# Patient Record
Sex: Female | Born: 1955 | Race: Black or African American | Hispanic: No | Marital: Married | Smoking: Never smoker
Health system: Southern US, Community
[De-identification: ages and names within clinical notes are randomized; demographics above are authoritative.]

## PROBLEM LIST (undated history)

## (undated) DIAGNOSIS — T7840XA Allergy, unspecified, initial encounter: Secondary | ICD-10-CM

## (undated) DIAGNOSIS — E119 Type 2 diabetes mellitus without complications: Secondary | ICD-10-CM

## (undated) DIAGNOSIS — D649 Anemia, unspecified: Secondary | ICD-10-CM

## (undated) DIAGNOSIS — I8390 Asymptomatic varicose veins of unspecified lower extremity: Secondary | ICD-10-CM

## (undated) DIAGNOSIS — D219 Benign neoplasm of connective and other soft tissue, unspecified: Secondary | ICD-10-CM

## (undated) DIAGNOSIS — I10 Essential (primary) hypertension: Secondary | ICD-10-CM

## (undated) DIAGNOSIS — K219 Gastro-esophageal reflux disease without esophagitis: Secondary | ICD-10-CM

## (undated) DIAGNOSIS — Z8639 Personal history of other endocrine, nutritional and metabolic disease: Secondary | ICD-10-CM

## (undated) HISTORY — PX: BREAST SURGERY: SHX581

## (undated) HISTORY — DX: Asymptomatic varicose veins of unspecified lower extremity: I83.90

## (undated) HISTORY — DX: Personal history of other endocrine, nutritional and metabolic disease: Z86.39

## (undated) HISTORY — DX: Type 2 diabetes mellitus without complications: E11.9

## (undated) HISTORY — PX: EYE SURGERY: SHX253

## (undated) HISTORY — DX: Benign neoplasm of connective and other soft tissue, unspecified: D21.9

## (undated) HISTORY — DX: Allergy, unspecified, initial encounter: T78.40XA

## (undated) HISTORY — DX: Anemia, unspecified: D64.9

## (undated) HISTORY — DX: Gastro-esophageal reflux disease without esophagitis: K21.9

## (undated) HISTORY — DX: Essential (primary) hypertension: I10

---

## 1987-05-08 HISTORY — PX: DILATION AND CURETTAGE OF UTERUS: SHX78

## 1994-05-07 HISTORY — PX: TUBAL LIGATION: SHX77

## 1997-06-08 ENCOUNTER — Ambulatory Visit (HOSPITAL_COMMUNITY): Admission: RE | Admit: 1997-06-08 | Discharge: 1997-06-08 | Payer: Self-pay | Admitting: Obstetrics & Gynecology

## 1998-05-30 ENCOUNTER — Other Ambulatory Visit: Admission: RE | Admit: 1998-05-30 | Discharge: 1998-05-30 | Payer: Self-pay | Admitting: Obstetrics and Gynecology

## 1998-06-28 ENCOUNTER — Ambulatory Visit (HOSPITAL_COMMUNITY): Admission: RE | Admit: 1998-06-28 | Discharge: 1998-06-28 | Payer: Self-pay | Admitting: Obstetrics and Gynecology

## 1998-06-28 ENCOUNTER — Encounter: Payer: Self-pay | Admitting: Obstetrics and Gynecology

## 1999-07-04 ENCOUNTER — Encounter: Payer: Self-pay | Admitting: Obstetrics and Gynecology

## 1999-07-04 ENCOUNTER — Ambulatory Visit (HOSPITAL_COMMUNITY): Admission: RE | Admit: 1999-07-04 | Discharge: 1999-07-04 | Payer: Self-pay | Admitting: Obstetrics and Gynecology

## 1999-07-11 ENCOUNTER — Encounter (INDEPENDENT_AMBULATORY_CARE_PROVIDER_SITE_OTHER): Payer: Self-pay

## 1999-07-11 ENCOUNTER — Other Ambulatory Visit: Admission: RE | Admit: 1999-07-11 | Discharge: 1999-07-11 | Payer: Self-pay | Admitting: Obstetrics and Gynecology

## 2000-07-08 ENCOUNTER — Encounter: Payer: Self-pay | Admitting: Obstetrics and Gynecology

## 2000-07-08 ENCOUNTER — Ambulatory Visit (HOSPITAL_COMMUNITY): Admission: RE | Admit: 2000-07-08 | Discharge: 2000-07-08 | Payer: Self-pay | Admitting: Obstetrics and Gynecology

## 2001-07-18 ENCOUNTER — Encounter: Payer: Self-pay | Admitting: Obstetrics and Gynecology

## 2001-07-18 ENCOUNTER — Ambulatory Visit (HOSPITAL_COMMUNITY): Admission: RE | Admit: 2001-07-18 | Discharge: 2001-07-18 | Payer: Self-pay | Admitting: Obstetrics and Gynecology

## 2001-09-02 ENCOUNTER — Other Ambulatory Visit: Admission: RE | Admit: 2001-09-02 | Discharge: 2001-09-02 | Payer: Self-pay | Admitting: Obstetrics and Gynecology

## 2002-07-21 ENCOUNTER — Encounter: Payer: Self-pay | Admitting: Obstetrics and Gynecology

## 2002-07-21 ENCOUNTER — Ambulatory Visit (HOSPITAL_COMMUNITY): Admission: RE | Admit: 2002-07-21 | Discharge: 2002-07-21 | Payer: Self-pay | Admitting: Obstetrics and Gynecology

## 2002-09-08 ENCOUNTER — Other Ambulatory Visit: Admission: RE | Admit: 2002-09-08 | Discharge: 2002-09-08 | Payer: Self-pay | Admitting: Obstetrics and Gynecology

## 2003-05-25 ENCOUNTER — Encounter: Admission: RE | Admit: 2003-05-25 | Discharge: 2003-08-23 | Payer: Self-pay | Admitting: Family Medicine

## 2003-07-27 ENCOUNTER — Ambulatory Visit (HOSPITAL_COMMUNITY): Admission: RE | Admit: 2003-07-27 | Discharge: 2003-07-27 | Payer: Self-pay | Admitting: Obstetrics and Gynecology

## 2003-11-02 ENCOUNTER — Other Ambulatory Visit: Admission: RE | Admit: 2003-11-02 | Discharge: 2003-11-02 | Payer: Self-pay | Admitting: Obstetrics and Gynecology

## 2004-07-27 ENCOUNTER — Ambulatory Visit (HOSPITAL_COMMUNITY): Admission: RE | Admit: 2004-07-27 | Discharge: 2004-07-27 | Payer: Self-pay | Admitting: Obstetrics and Gynecology

## 2005-07-31 ENCOUNTER — Ambulatory Visit (HOSPITAL_COMMUNITY): Admission: RE | Admit: 2005-07-31 | Discharge: 2005-07-31 | Payer: Self-pay | Admitting: Obstetrics and Gynecology

## 2006-08-05 ENCOUNTER — Ambulatory Visit (HOSPITAL_COMMUNITY): Admission: RE | Admit: 2006-08-05 | Discharge: 2006-08-05 | Payer: Self-pay | Admitting: Obstetrics and Gynecology

## 2007-08-11 ENCOUNTER — Ambulatory Visit (HOSPITAL_COMMUNITY): Admission: RE | Admit: 2007-08-11 | Discharge: 2007-08-11 | Payer: Self-pay | Admitting: Obstetrics and Gynecology

## 2008-05-07 HISTORY — PX: BARTHOLIN GLAND CYST EXCISION: SHX565

## 2008-08-23 ENCOUNTER — Ambulatory Visit (HOSPITAL_COMMUNITY): Admission: RE | Admit: 2008-08-23 | Discharge: 2008-08-23 | Payer: Self-pay | Admitting: Obstetrics and Gynecology

## 2008-09-04 ENCOUNTER — Encounter (INDEPENDENT_AMBULATORY_CARE_PROVIDER_SITE_OTHER): Payer: Self-pay | Admitting: Obstetrics and Gynecology

## 2008-09-04 ENCOUNTER — Ambulatory Visit (HOSPITAL_COMMUNITY): Admission: RE | Admit: 2008-09-04 | Discharge: 2008-09-04 | Payer: Self-pay | Admitting: Obstetrics and Gynecology

## 2009-08-29 ENCOUNTER — Ambulatory Visit (HOSPITAL_COMMUNITY): Admission: RE | Admit: 2009-08-29 | Discharge: 2009-08-29 | Payer: Self-pay | Admitting: Obstetrics and Gynecology

## 2010-07-24 ENCOUNTER — Other Ambulatory Visit (HOSPITAL_COMMUNITY): Payer: Self-pay | Admitting: Obstetrics and Gynecology

## 2010-07-24 DIAGNOSIS — Z1231 Encounter for screening mammogram for malignant neoplasm of breast: Secondary | ICD-10-CM

## 2010-08-16 LAB — URINALYSIS, ROUTINE W REFLEX MICROSCOPIC
Ketones, ur: NEGATIVE mg/dL
Leukocytes, UA: NEGATIVE
Specific Gravity, Urine: 1.025 (ref 1.005–1.030)
Urobilinogen, UA: 0.2 mg/dL (ref 0.0–1.0)
pH: 6 (ref 5.0–8.0)

## 2010-08-16 LAB — BASIC METABOLIC PANEL
BUN: 9 mg/dL (ref 6–23)
CO2: 24 mEq/L (ref 19–32)
Creatinine, Ser: 0.59 mg/dL (ref 0.4–1.2)
GFR calc Af Amer: >60 mL/min (ref >60)
GFR calc non Af Amer: >60 mL/min (ref >60)
Potassium: 4 mEq/L (ref 3.5–5.1)

## 2010-08-16 LAB — URINE MICROSCOPIC-ADD ON

## 2010-08-16 LAB — CBC
HCT: 33.8 % — ABNORMAL LOW (ref 36.0–46.0)
Platelets: 297 10*3/uL (ref 150–400)
RBC: 4.16 MIL/uL (ref 3.87–5.11)

## 2010-08-16 LAB — PREGNANCY, URINE: Preg Test, Ur: NEGATIVE

## 2010-09-04 ENCOUNTER — Ambulatory Visit (HOSPITAL_COMMUNITY)
Admission: RE | Admit: 2010-09-04 | Discharge: 2010-09-04 | Disposition: A | Discharge status: Home / Self Care | Payer: 59 | Source: Ambulatory Visit | Attending: Obstetrics and Gynecology | Admitting: Obstetrics and Gynecology

## 2010-09-04 DIAGNOSIS — Z1231 Encounter for screening mammogram for malignant neoplasm of breast: Secondary | ICD-10-CM | POA: Insufficient documentation

## 2010-09-19 NOTE — Op Note (Signed)
NAMESHIMIKA, Heidi Howell                ACCOUNT NO.:  [id number]   MEDICAL RECORD NO.:  [id number]          PATIENT TYPE:  AMB   LOCATION:  SDC                           FACILITY:  [facility]   PHYSICIAN:  Janine Limbo, M.D.DATE OF BIRTH:  1955/08/17   DATE OF PROCEDURE:  09/04/2008  DATE OF DISCHARGE:                               OPERATIVE REPORT   PREOPERATIVE DIAGNOSIS:  Left Bartholin gland cyst.   POSTOPERATIVE DIAGNOSIS:  Left Bartholin gland cyst.   PROCEDURE:  Left Bartholin gland cystectomy.   SURGEON:  Janine Limbo, MD   FIRST ASSISTANT:  None.   ANESTHETIC:  General.   DISPOSITION:  [name] is a 55 year old female, para 2-0-1-2, who  presents with a recurrent left Bartholin gland cyst.  She has had a Work  catheter placed in the past, but the lesion continues to recur.  The  areas very uncomfortable and dyspareunia is also a problem.  She  understands the indications for her surgical procedure as well as her  alternative treatment options.  She accepts the risks of, but not  limited to, anesthetic complications, bleeding, infection, and possible  damage to the surrounding organs.   FINDINGS:  The patient was noted to have a 7-cm left Bartholin gland  cyst that did not appear to be infected.  The remainder of her perineum  was normal.   PROCEDURE:  The patient taken to the operating room where a general  anesthetic was given.  The patient'Heidi lower abdomen, perineum, and vagina  were prepped with multiple layers of Betadine.  The bladder was drained  of urine.  The patient was sterilely draped.  The medial aspect of the  left labia was injected with 4 mL of 0.5% Marcaine with epinephrine.  An  incision was made and carried sharply the level of the Bartholin gland  cyst.  A combination of sharp and blunt dissection was then used to  dissect through the cyst from the labia and the underlying tissue.  The  cyst was removed intact.  We clamped and cut the vessels.  We  used  figure-of-eight sutures of 2-0 Vicryl to obtain hemostasis.  The  perineum and the left labial area was then injected with another 14 mL  of 0.5% Marcaine with epinephrine.  Hemostasis was noted to be adequate  throughout.  The defect was then closed using a running suture of 2-0  Vicryl.  A drain was placed in the base of the deep labial tissue and  then the skin was closed using 3-0 Monocryl.  Sponge, needle, and  instrument counts were correct on 2 occasions.  The estimated blood loss  was 100 mL.  The patient tolerated her procedure well.  She was awakened  from her anesthetic without difficulty and transported to the recovery  room in stable condition.  The left Bartholin gland cyst was sent to  Pathology for evaluation.   FOLLOWUP INSTRUCTIONS:  The patient will return to see Dr. Stefano Gaul on  Sep 07, 2008, to have the drain removed.  She will call for questions or  concerns.  She will take Motrin 800 mg every 8 hours as needed for mild-  to-  moderate pain.  She can take Vicodin 1 or 2 tablets every 4 hours as  needed for severe pain.  She was given a prescription for Zofran and she  can take 8 mg every 8 hours as needed for nausea.  The patient will call  for fever or excessive pain.      Janine Limbo, M.D.  Electronically Signed     AVS/MEDQ  D:  09/04/2008  T:  09/05/2008  Job:  [id number]

## 2010-09-19 NOTE — H&P (Signed)
NAMESENG, Heidi Howell                ACCOUNT NO.:  [id number]   MEDICAL RECORD NO.:  [id number]          PATIENT TYPE:  AMB   LOCATION:  SDC                           [facility]ILITY:  [facility]   PHYSICIAN:  Janine Limbo, M.D.DATE OF BIRTH:  12-20-1955   DATE OF ADMISSION:  09/04/2008  DATE OF DISCHARGE:                              HISTORY & PHYSICAL   HISTORY OF PRESENT ILLNESS:  Heidi Howell is a 55 year old female, para 2-0-  1-2, who presents for a left Bartholin's gland cystectomy.  The patient  has been followed at the [facility] for women.  The patient has had marsupialization of the left  Bartholin's gland with placement of a Word catheter.  She continues to  have swelling and pain.   OBSTETRICAL HISTORY:  The patient has had two cesarean sections at term.  She has had one first trimester miscarriage.   DRUG ALLERGIES:  NO KNOWN DRUG ALLERGIES.   PAST MEDICAL HISTORY:  The patient has a history of hypertension and  diabetes.  She currently takes hydrochlorothiazide, metformin, and  Diovan.  She has a known history of fibroids.  She continues to have  cycles that are not terribly uncomfortable.   SOCIAL HISTORY:  The patient denies cigarette use, alcohol use, and  recreational drug use.   REVIEW OF SYSTEMS:  Please see history of present illness.   FAMILY HISTORY:  Noncontributory.   PHYSICAL EXAMINATION:  VITAL SIGNS:  Weight is 198 pounds, height is 5  feet 4 inches.  HEENT:  Within normal limits.  CHEST:  Clear.  HEART:  Regular rate and rhythm.  BREASTS:  Without masses.  ABDOMEN:  Nontender.  EXTREMITIES:  Grossly normal.  NEUROLOGIC:  Grossly normal.  PELVIC:  External genitalia shows a 3 cm nontender Bartholin's gland on  the left.  The vagina is normal.  Cervix is nontender.  Uterus is upper  limits normal size and nontender.  Adnexa no masses and rectovaginal  exam confirms.   ASSESSMENT:  1. Recurrent left  Bartholin's gland cyst.  2. Hypertension.  3. Diabetes.  4. Obesity.   PLAN:  The patient will undergo a left Bartholin's gland cystectomy.  The patient understands the indications for her surgical procedure and  she accepts the risks of, but not limited to, anesthetic complications,  bleeding, infection, and possible damage to the surrounding organs.      Janine Limbo, M.D.  Electronically Signed     AVS/MEDQ  D:  09/03/2008  T:  09/03/2008  Job:  [id number]

## 2011-07-20 ENCOUNTER — Telehealth: Payer: Self-pay

## 2011-07-20 NOTE — Telephone Encounter (Signed)
Attn Heidi Howell/LAB   Patient called regarding [facility] lab bill she called them and was told the labs were submitted with incorrect codes. Please review and follow up as soon as possible per patients requests as she is receiving bills from [place] and does not want to be turned over to collections. The bill is for date of service 02/01/11. Patients phone number is [phone]   Thank you

## 2011-07-20 NOTE — Telephone Encounter (Signed)
Spoke with Karmen at [facility], she will re-file labs.  Advised patient labs will be re-filed.  Patient states understanding. Dx codes 250.00 and 401.1

## 2011-08-07 ENCOUNTER — Other Ambulatory Visit: Payer: Self-pay | Admitting: Obstetrics and Gynecology

## 2011-08-07 DIAGNOSIS — Z1231 Encounter for screening mammogram for malignant neoplasm of breast: Secondary | ICD-10-CM

## 2011-08-20 ENCOUNTER — Ambulatory Visit (INDEPENDENT_AMBULATORY_CARE_PROVIDER_SITE_OTHER): Payer: PRIVATE HEALTH INSURANCE | Admitting: Obstetrics and Gynecology

## 2011-08-20 ENCOUNTER — Encounter: Payer: Self-pay | Admitting: Obstetrics and Gynecology

## 2011-08-20 VITALS — BP 140/82 | Resp 16 | Ht <= 58 in | Wt 181.0 lb

## 2011-08-20 DIAGNOSIS — E119 Type 2 diabetes mellitus without complications: Secondary | ICD-10-CM

## 2011-08-20 DIAGNOSIS — Z01419 Encounter for gynecological examination (general) (routine) without abnormal findings: Secondary | ICD-10-CM

## 2011-08-20 DIAGNOSIS — D219 Benign neoplasm of connective and other soft tissue, unspecified: Secondary | ICD-10-CM

## 2011-08-20 DIAGNOSIS — G4700 Insomnia, unspecified: Secondary | ICD-10-CM

## 2011-08-20 DIAGNOSIS — N951 Menopausal and female climacteric states: Secondary | ICD-10-CM | POA: Insufficient documentation

## 2011-08-20 DIAGNOSIS — E669 Obesity, unspecified: Secondary | ICD-10-CM | POA: Insufficient documentation

## 2011-08-20 DIAGNOSIS — D259 Leiomyoma of uterus, unspecified: Secondary | ICD-10-CM | POA: Insufficient documentation

## 2011-08-20 MED ORDER — ZOLPIDEM TARTRATE 10 MG PO TABS: 10.0000 mg | ORAL_TABLET | Freq: Every evening | ORAL | Status: DC | PRN

## 2011-08-20 NOTE — Patient Instructions (Signed)

## 2011-08-20 NOTE — Progress Notes (Signed)
Contraception BTL Last pap 08/2011 Last Mammo 08/2010 wnl Last Colonoscopy 2009 Last Dexa Scan never PCP : Dr Milus Glazier Abuse at Home No

## 2011-08-20 NOTE — Progress Notes (Addendum)
Subjective:    NELI FOFANA is a 56 y.o. female who presents for annual exam. The patient has no complaints today. The patient is sexually active. GYN screening history: last pap: was normal and last mammogram: was normal. The patient is not taking hormone replacement therapy. Bleeding is not cyclic.. The patient wears seatbelts: yes. The patient participates in regular exercise: yes. Has the patient ever been transfused or tattooed?: not asked. The patient reports that there is not domestic violence in her life. Patient C/O pain in her shoulders and back from heavy breast.   Menstrual History: OB History    Grav Para Term Preterm Abortions TAB SAB Ect Mult Living                  Menarche age: 51 Patient's last menstrual period was 01/19/2011.    The following portions of the patient's history were reviewed and updated as appropriate: allergies, current medications, past family history, past medical history, past social history, past surgical history and problem list.  Review of Systems A comprehensive review of systems was negative.    Objective:    BP 140/82  Resp 16  Ht 4\' 4"  (1.321 m)  Wt 181 lb (82.101 kg)  BMI 47.06 kg/m2  LMP 01/19/2011  General Appearance:    Alert, cooperative, no distress, appears stated age  Head:    Normocephalic, without obvious abnormality, atraumatic  Eyes:    PERRL, conjunctiva/corneas clear, EOM's intact, fundi    benign, both eyes  Ears:    Normal TM's and external ear canals, both ears  Nose:   Nares normal, septum midline, mucosa normal, no drainage    or sinus tenderness  Throat:   Lips, mucosa, and tongue normal; teeth and gums normal  Neck:   Supple, symmetrical, trachea midline, no adenopathy;    thyroid:  no enlargement/tenderness/nodules; no carotid   bruit or JVD  Back:     Symmetric, no curvature, ROM normal, no CVA tenderness  Lungs:     Clear to auscultation bilaterally, respirations unlabored  Chest Wall:    No tenderness or  deformity   Heart:    Regular rate and rhythm, S1 and S2 normal, no murmur, rub   or gallop  Breast Exam:    No tenderness, masses, or nipple abnormality  Abdomen:     Soft, non-tender, bowel sounds active all four quadrants,    no masses, no organomegaly  Genitalia:    Normal female without lesion, discharge or tenderness  Rectal:    Normal tone, normal prostate, no masses or tenderness;   guaiac negative stool  Extremities:   Extremities normal, atraumatic, no cyanosis or edema  Pulses:   2+ and symmetric all extremities  Skin:   Skin color, texture, turgor normal, no rashes or lesions  Lymph nodes:   Cervical, supraclavicular, and axillary nodes normal  Neurologic:   CNII-XII intact, normal strength, sensation and reflexes    throughout      Assessment:    Normal gyn exam Menopause Obesity  Diabetes Insomnia Large breast causing shoulder and back pain.   Plan:    All questions answered. Await pap smear results. Discussed healthy lifestyle modifications. Agricultural engineer distributed. Follow up as needed. Ambien 10 mg qhs prn insomnia.

## 2011-09-10 ENCOUNTER — Ambulatory Visit (HOSPITAL_COMMUNITY)
Admission: RE | Admit: 2011-09-10 | Discharge: 2011-09-10 | Disposition: A | Discharge status: Home / Self Care | Payer: 59 | Source: Ambulatory Visit | Attending: Obstetrics and Gynecology | Admitting: Obstetrics and Gynecology

## 2011-09-10 DIAGNOSIS — Z1231 Encounter for screening mammogram for malignant neoplasm of breast: Secondary | ICD-10-CM

## 2011-09-21 ENCOUNTER — Other Ambulatory Visit: Payer: Self-pay | Admitting: Family Medicine

## 2011-10-09 ENCOUNTER — Encounter: Payer: Self-pay | Admitting: Obstetrics and Gynecology

## 2011-10-25 ENCOUNTER — Telehealth: Payer: Self-pay | Admitting: Obstetrics and Gynecology

## 2011-10-25 NOTE — Telephone Encounter (Signed)
Can you scd this pt for appt for the seminar with AVS

## 2011-10-25 NOTE — Telephone Encounter (Signed)
Niccole/epic/elect.

## 2011-10-26 ENCOUNTER — Telehealth: Payer: Self-pay

## 2011-10-26 NOTE — Telephone Encounter (Signed)
Pt returning your call

## 2011-10-26 NOTE — Telephone Encounter (Signed)
VM was left letting pt know I received her message that she will be attending  The 10-31-11 seminar w/ Dr Stefano Gaul here in the office. [facility] CMA

## 2011-10-26 NOTE — Telephone Encounter (Signed)
Detailed VM left for pt to call the office back to confirm if she will attend Mammogram Seminar. Which  will be held by Dr.Stringer on 10-31-11 @ 4:15pm in the conference room. Will await pt call back. [facility] CMA

## 2011-10-31 ENCOUNTER — Ambulatory Visit (INDEPENDENT_AMBULATORY_CARE_PROVIDER_SITE_OTHER): Payer: PRIVATE HEALTH INSURANCE | Admitting: Obstetrics and Gynecology

## 2011-10-31 ENCOUNTER — Encounter: Payer: Self-pay | Admitting: Obstetrics and Gynecology

## 2011-10-31 VITALS — BP 162/88 | Wt 188.0 lb

## 2011-10-31 DIAGNOSIS — R922 Inconclusive mammogram: Secondary | ICD-10-CM

## 2011-10-31 NOTE — Progress Notes (Signed)
HISTORY OF PRESENT ILLNESS  Ms. Heidi Howell is a 56 y.o. year old female,No obstetric history on file., who presents for a problem visit. The patient recently had a mammogram that showed dense breast.  She presents for further discussion and evaluation.  Past breast history:  Personal history of breast cancer:          No Family history of breast cancer:              No Breast discharge:                                    No Breast bleeding:                                      No Menarche prior to 56 years of age:         No First child after age 9 years:                  No Cigarette smoker:                                    No Estrogen replacement therapy:               No Excessive alcohol consumption:             No   Objective:  BP: 162/88 Weight: 188 pounds  Breast exam:  No dimpling, skin changes, retractions, masses, nipple discharge, nipple bleeding, or axillary lymphadenopathy.   Assessment:  Dense breast on mammogram.  Plan:  The patient and I discussed the implications of having dense breast on mammogram.  Only five states in the [place] require that patients be notified of dense breast on mammogram.  [place] does not require notification. Only one state requires that insurance companies pay for follow-up evaluation.  The patient was told that dense breast are an independent risk factor for breast cancer.  She was told that having dense breast does not mean that the patient has breast cancer, nor will develop breast cancer.  It is known that dense breast make it more difficult to read and interpret mammograms.  There are other modalities for evaluating breast.  They include breast ultrasound, tomosynthesis, and MRI.  The patient understands that these tests are expensive, and that her insurance company may or may not pay to have these done. Ultrasound, tomosynthesis, and MRI show more findings that are not cancer, which can result in additional testing  and unnecessary biopsies. The patient was offered the previously mentioned tests, but declines further testing at this time. The patient was told that the [employer] of Obstetricians and Gynecologists recommends annual mammograms starting at age 23.  [name] model evaluation shows 1% risk over 5 years and 6.2% risk prior to age 25 years.  Breast awareness was reviewed with the patient.  Her questions were answered.  The patient will return for her annual exam.  The patient was given written information about dense breast and proper breast care.   Leonard Schwartz M.D.  10/31/2011 5:35 PM

## 2011-10-31 NOTE — Patient Instructions (Signed)
Mammogram Tips   Healthy women should begin getting mammograms every year or two once they reach age 54, and once a year when they reach age 50. Here are tips:  Find an experienced, high-volume center with accomplished radiologists. You can ask for their credentials.   Ask to see the certificate showing the center is approved by the U.S. Food and Drug Administration.   Use the same center regularly, so it is easier to compare your new mammograms with your old ones.   Bring a list of places you have had mammograms, dates, biopsies or other breast treatments. Bring old mammograms with you or have them sent to your primary caregiver.   Describe any breast problems to your caregiver or the person doing the mammogram. Be ready to give past surgeries, birth control pills, hormone use, breast implants, growths, moles, breast scars and family or personal history of breast cancer.   Call your doctor or center to check on the mammogram if you hear nothing within 10 days. Do not assume everything was normal.   To protect your privacy, the mammogram results cannot be given over the phone or to anyone but you.   Radiation from a mammogram is very low and does not pose a radiation risk.   Mammograms can detect breast problems other than breast cancer.   You may be asked stand or sit in front of the X-ray machine.   Two small plastic or glass plates are placed around the breast when taking the X-ray.   If you are menstruating, schedule your mammogram a week after your menstrual period.   Do not wear deodorants, powder or perfume when getting a mammogram.   Wash your breasts and under your arms before getting a mammogram.   Wear cloths that are easy for you to undress and dress.   Arrive at the center at least 15 minutes before the mammogram is scheduled.   There may be slight discomfort during the mammogram, but it goes away shortly after the test.   Try to relax as much as possible during the  mammogram.   Talk to your caregiver if you do not understand the results of the mammogram.   Follow the recommendations of your caregiver regarding further tests and treatments if needed.   Get a second opinion if you are concerned or question the results of the mammogram, further tests or treatment if needed.   Continue with monthly self-breast exams and yearly caregiver exams even if the mammogram is normal.   Your caregiver may recommend getting a mammogram before age 54 and more often if you are at high risk for developing breast cancer.  Document Released: 08/09/2005 Document Revised: 04/12/2011 Document Reviewed: 04/16/2008 Exit[employer] Patient Information 2012 [employer]. 

## 2011-10-31 NOTE — Progress Notes (Signed)
Repeat B/P done in R arm 142/98.

## 2011-11-15 ENCOUNTER — Ambulatory Visit (INDEPENDENT_AMBULATORY_CARE_PROVIDER_SITE_OTHER): Payer: 59 | Admitting: Family Medicine

## 2011-11-15 ENCOUNTER — Encounter: Payer: Self-pay | Admitting: Family Medicine

## 2011-11-15 VITALS — BP 169/80 | HR 65 | Temp 98.7°F | Resp 18 | Ht 64.25 in | Wt 186.0 lb

## 2011-11-15 DIAGNOSIS — I10 Essential (primary) hypertension: Secondary | ICD-10-CM

## 2011-11-15 DIAGNOSIS — Z9109 Other allergy status, other than to drugs and biological substances: Secondary | ICD-10-CM

## 2011-11-15 DIAGNOSIS — J309 Allergic rhinitis, unspecified: Secondary | ICD-10-CM

## 2011-11-15 DIAGNOSIS — E119 Type 2 diabetes mellitus without complications: Secondary | ICD-10-CM

## 2011-11-15 LAB — LIPID PANEL
Cholesterol: 149 mg/dL (ref 0–200)
HDL: 48 mg/dL (ref >39)
LDL Cholesterol: 85 mg/dL (ref 0–99)
Total CHOL/HDL Ratio: 3.1 Ratio
Triglycerides: 82 mg/dL (ref <150)
VLDL: 16 mg/dL (ref 0–40)

## 2011-11-15 LAB — POCT GLYCOSYLATED HEMOGLOBIN (HGB A1C): Hemoglobin A1C: 8.7

## 2011-11-15 LAB — COMPREHENSIVE METABOLIC PANEL
ALT: 20 U/L (ref 0–35)
AST: 18 U/L (ref 0–37)
Albumin: 4 g/dL (ref 3.5–5.2)
Alkaline Phosphatase: 59 U/L (ref 39–117)
BUN: 9 mg/dL (ref 6–23)
CO2: 25 mEq/L (ref 19–32)
Calcium: 9.3 mg/dL (ref 8.4–10.5)
Chloride: 105 mEq/L (ref 96–112)
Creat: 0.7 mg/dL (ref 0.50–1.10)
Glucose, Bld: 165 mg/dL — ABNORMAL HIGH (ref 70–99)
Potassium: 4.3 mEq/L (ref 3.5–5.3)
Sodium: 138 mEq/L (ref 135–145)
Total Bilirubin: 0.5 mg/dL (ref 0.3–1.2)
Total Protein: 6.6 g/dL (ref 6.0–8.3)

## 2011-11-15 MED ORDER — TRAZODONE HCL 100 MG PO TABS: 100.0000 mg | ORAL_TABLET | Freq: Every day | ORAL | Status: DC

## 2011-11-15 MED ORDER — SITAGLIPTIN PHOS-METFORMIN HCL 50-1000 MG PO TABS: 1.0000 | ORAL_TABLET | Freq: Two times a day (BID) | ORAL | Status: DC

## 2011-11-15 MED ORDER — GLUCOSE BLOOD VI STRP: ORAL_STRIP | Status: DC

## 2011-11-15 MED ORDER — MONTELUKAST SODIUM 10 MG PO TABS: 10.0000 mg | ORAL_TABLET | Freq: Every day | ORAL | Status: DC

## 2011-11-15 MED ORDER — VALSARTAN-HYDROCHLOROTHIAZIDE 160-12.5 MG PO TABS: 1.0000 | ORAL_TABLET | Freq: Every day | ORAL | Status: DC

## 2011-11-15 MED ORDER — METOPROLOL SUCCINATE ER 100 MG PO TB24: 100.0000 mg | ORAL_TABLET | Freq: Every day | ORAL | Status: DC

## 2011-11-15 NOTE — Progress Notes (Signed)
@UMFCLOGO @  Patient ID: Heidi Howell MRN: [id number], DOB: 02-01-1956, 56 y.o. Date of Encounter: 11/15/2011, 8:56 AM  Primary Physician: Janine Limbo, MD  Chief Complaint: Diabetes follow up  HPI: 56 y.o. year old female with history below presents for follow up of diabetes mellitus. Doing well. No issues or complaints. Taking medications daily without adverse effects. No polydipsia, polyphagia, polyuria, or nocturia.  Blood sugars at home: Legs are sore after working all day on her feet Exercising regularly. Last A1C:   Eye MD:  March 2013  Influenza vaccine: Pneumococcal vaccine: Last CPE:  Past Medical History  Diagnosis Date  . Yeast infection   . Anemia   . H/O fatigue   . Fibroids   . H/O menorrhagia 2001  . Cyst   . Bartholin gland cyst 06/04/2000  . H/O dysmenorrhea 06/04/2000  . Menopausal symptoms   . Diabetes mellitus   . Infertility, female   . Hypertension   . H/O: obesity   . Varicose vein   . Glaucoma   . FH: colon cancer      Home Meds: Prior to Admission medications   Medication Sig Start Date End Date Taking? Authorizing Provider  cyclobenzaprine (FLEXERIL) 10 MG tablet Take 10 mg by mouth 3 (three) times daily as needed.   Yes Historical Provider, MD  metoprolol succinate (TOPROL-XL) 100 MG 24 hr tablet Take 100 mg by mouth daily. Take with or immediately following a meal.   Yes Historical Provider, MD  montelukast (SINGULAIR) 10 MG tablet Take 10 mg by mouth at bedtime.   Yes Historical Provider, MD  ONE TOUCH ULTRA TEST test strip USE TO TEST TWO TO THREE TIMES A DAY 09/21/11  Yes Ryan M Dunn, PA-C  rOPINIRole (REQUIP) 0.5 MG tablet Take 0.5 mg by mouth 3 (three) times daily.   Yes Historical Provider, MD  sitaGLIPtan-metformin (JANUMET) 50-1000 MG per tablet Take 1 tablet by mouth 2 (two) times daily with a meal.   Yes Historical Provider, MD  Travoprost, BAK Free, (TRAVATAN) 0.004 % SOLN ophthalmic solution 1 drop at bedtime.   Yes  Historical Provider, MD  traZODone (DESYREL) 100 MG tablet Take 100 mg by mouth at bedtime.   Yes Historical Provider, MD  valsartan-hydrochlorothiazide (DIOVAN-HCT) 160-12.5 MG per tablet Take 1 tablet by mouth daily.   Yes Historical Provider, MD  zolpidem (AMBIEN) 10 MG tablet Take 1 tablet (10 mg total) by mouth at bedtime as needed for sleep. 08/20/11 09/19/11  Kirkland Hun, MD    Allergies: No Known Allergies  History   Social History  . Marital Status: Married    Spouse Name: N/A    Number of Children: N/A  . Years of Education: N/A   Occupational History  . Not on file.   Social History Main Topics  . Smoking status: Never Smoker   . Smokeless tobacco: Never Used  . Alcohol Use: No  . Drug Use: No  . Sexually Active: Yes -- Female partner(s)   Other Topics Concern  . Not on file   Social History Narrative  . No narrative on file     Review of Systems: Constitutional: negative for chills, fever, night sweats, weight changes, or fatigue  HEENT: negative for vision changes, hearing loss, congestion, rhinorrhea, or epistaxis Cardiovascular: negative for chest pain, palpitations, diaphoresis, DOE, orthopnea, or edema Respiratory: negative for hemoptysis, wheezing, shortness of breath, dyspnea, or cough Abdominal: negative for abdominal pain, nausea, vomiting, diarrhea, or constipation Dermatological: negative for rash, erythema, or  wounds Neurologic: negative for headache, dizziness, or syncope Renal:  Negative for polyuria, polydipsia, or dysuria All other systems reviewed and are otherwise negative with the exception to those above and in the HPI.   Physical Exam:  130/84 right, 130/72 left Blood pressure 169/80, pulse 65, temperature 98.7 F (37.1 C), temperature source Oral, resp. rate 18, height 5' 4.25" (1.632 m), weight 186 lb (84.369 kg)., Body mass index is 31.68 kg/(m^2). General: Well developed, well nourished, in no acute distress. Head: Normocephalic,  atraumatic, eyes without discharge, sclera non-icteric, nares are without discharge. Bilateral auditory canals clear, TM's are without perforation, pearly grey and translucent with reflective cone of light bilaterally. Oral cavity moist, posterior pharynx without exudate, erythema, peritonsillar abscess, or post nasal drip.  Neck: Supple. No thyromegaly. Full ROM. No lymphadenopathy. Lungs: Clear bilaterally to auscultation without wheezes, rales, or rhonchi. Breathing is unlabored. Heart: RRR with S1 S2. No murmurs, rubs, or gallops appreciated. Abdomen: Soft, non-tender, non-distended with normoactive bowel sounds. No hepatosplenomegaly. No rebound/guarding. No obvious abdominal masses. Msk:  Strength and tone normal for age. Extremities/Skin: Warm and dry. No clubbing or cyanosis. No edema. No rashes, wounds, or suspicious lesions. Monofilament exam unremarkable bilaterally.  Neuro: Alert and oriented X 3. Moves all extremities spontaneously. Gait is normal. CNII-XII grossly in tact. Psych:  Responds to questions appropriately with a normal affect.   Labs: Results for orders placed in visit on 11/15/11  POCT GLYCOSYLATED HEMOGLOBIN (HGB A1C)      Component Value Range   Hemoglobin A1C 8.7        ASSESSMENT AND PLAN:  56 y.o. year old female with type 2 diabetes.  Leg pains will be treated with magnesium otc - 1. Hypertension  metoprolol succinate (TOPROL-XL) 100 MG 24 hr tablet, valsartan-hydrochlorothiazide (DIOVAN-HCT) 160-12.5 MG per tablet, Comprehensive metabolic panel  2. Diabetes mellitus  traZODone (DESYREL) 100 MG tablet, sitaGLIPtan-metformin (JANUMET) 50-1000 MG per tablet, glucose blood (ONE TOUCH ULTRA TEST) test strip, Microalbumin, urine, POCT glycosylated hemoglobin (Hb A1C), Comprehensive metabolic panel, Lipid panel  3. Environmental allergies  montelukast (SINGULAIR) 10 MG tablet   I discussed the elevated hgb A1C with patient and husband.  They promise to cut back on  carbs.  Recheck 3 months  Signed, Elvina Sidle, MD 11/15/2011 8:56 AM

## 2011-11-16 ENCOUNTER — Encounter: Payer: Self-pay | Admitting: Family Medicine

## 2011-11-16 LAB — MICROALBUMIN, URINE: Microalb, Ur: 0.83 mg/dL (ref 0.00–1.89)

## 2012-01-24 ENCOUNTER — Ambulatory Visit (INDEPENDENT_AMBULATORY_CARE_PROVIDER_SITE_OTHER): Payer: 59 | Admitting: Family Medicine

## 2012-01-24 ENCOUNTER — Encounter: Payer: Self-pay | Admitting: Family Medicine

## 2012-01-24 VITALS — BP 132/88 | HR 71 | Temp 98.3°F | Resp 16 | Ht 64.0 in | Wt 185.0 lb

## 2012-01-24 DIAGNOSIS — M545 Low back pain, unspecified: Secondary | ICD-10-CM

## 2012-01-24 DIAGNOSIS — M549 Dorsalgia, unspecified: Secondary | ICD-10-CM

## 2012-01-24 DIAGNOSIS — Z0000 Encounter for general adult medical examination without abnormal findings: Secondary | ICD-10-CM

## 2012-01-24 DIAGNOSIS — E119 Type 2 diabetes mellitus without complications: Secondary | ICD-10-CM

## 2012-01-24 LAB — LIPID PANEL
Cholesterol: 173 mg/dL (ref 0–200)
HDL: 58 mg/dL (ref >39)
LDL Cholesterol: 99 mg/dL (ref 0–99)
Total CHOL/HDL Ratio: 3 Ratio
Triglycerides: 81 mg/dL (ref <150)
VLDL: 16 mg/dL (ref 0–40)

## 2012-01-24 LAB — TSH: TSH: 0.738 u[IU]/mL (ref 0.350–4.500)

## 2012-01-24 LAB — CBC
HCT: 37.2 % (ref 36.0–46.0)
Hemoglobin: 12.6 g/dL (ref 12.0–15.0)
MCH: 27 pg (ref 26.0–34.0)
MCHC: 33.9 g/dL (ref 30.0–36.0)
MCV: 79.7 fL (ref 78.0–100.0)
Platelets: 339 10*3/uL (ref 150–400)
RBC: 4.67 MIL/uL (ref 3.87–5.11)
RDW: 13.4 % (ref 11.5–15.5)
WBC: 8.4 10*3/uL (ref 4.0–10.5)

## 2012-01-24 LAB — COMPREHENSIVE METABOLIC PANEL
ALT: 24 U/L (ref 0–35)
AST: 19 U/L (ref 0–37)
Albumin: 4.5 g/dL (ref 3.5–5.2)
Alkaline Phosphatase: 66 U/L (ref 39–117)
BUN: 12 mg/dL (ref 6–23)
CO2: 29 mEq/L (ref 19–32)
Calcium: 9.9 mg/dL (ref 8.4–10.5)
Chloride: 103 mEq/L (ref 96–112)
Creat: 0.73 mg/dL (ref 0.50–1.10)
Glucose, Bld: 170 mg/dL — ABNORMAL HIGH (ref 70–99)
Potassium: 4.5 mEq/L (ref 3.5–5.3)
Sodium: 136 mEq/L (ref 135–145)
Total Bilirubin: 0.5 mg/dL (ref 0.3–1.2)
Total Protein: 7.4 g/dL (ref 6.0–8.3)

## 2012-01-24 LAB — POCT GLYCOSYLATED HEMOGLOBIN (HGB A1C): Hemoglobin A1C: 8.2

## 2012-01-24 NOTE — Patient Instructions (Signed)

## 2012-01-24 NOTE — Progress Notes (Signed)
@[email or web address] @  Patient ID: Heidi Howell MRN: [id number], DOB: 25-Nov-1955, 56 y.o. Date of Encounter: 01/24/2012, 9:27 AM  Primary Physician: Janine Limbo, MD  Chief Complaint: Physical (CPE)  HPI: 56 y.o. y/o female with history of noted below here for CPE.  Doing well. C/o trouble losing weight. Wants breast reduction.  LMP: March 2013.  Very intermittent now Pap:  April 2013 MMG: 2013 Pneumovax 02/13/08 Review of Systems: Consitutional: No fever, chills, fatigue, night sweats, lymphadenopathy, or weight changes. Eyes: No visual changes, eye redness, or discharge. ENT/Mouth: Ears: No otalgia, tinnitus, hearing loss, discharge. Nose: No congestion, rhinorrhea, sinus pain, or epistaxis. Throat: No sore throat, post nasal drip, or teeth pain. Cardiovascular: No CP, palpitations, diaphoresis, DOE, edema, orthopnea, PND. Respiratory: No cough, hemoptysis, SOB, or wheezing. Gastrointestinal: No anorexia, dysphagia, reflux, pain, nausea, vomiting, hematemesis, diarrhea, constipation, BRBPR, or melena. Breast: No discharge, pain, swelling, or mass. Genitourinary: No dysuria, frequency, urgency, hematuria, incontinence, nocturia, amenorrhea, vaginal discharge, pruritis, burning, abnormal bleeding, or pain. Musculoskeletal: No decreased ROM, joint swelling, or weakness. Some back and shoulder pain. Skin: No rash, erythema, lesion changes, pain, warmth, jaundice, or pruritis. Neurological: No headache, dizziness, syncope, seizures, tremors, memory loss, coordination problems, or paresthesias. Psychological: No anxiety, depression, hallucinations, SI/HI. Endocrine: No fatigue, polydipsia, polyphagia, polyuria, or known diabetes. All other systems were reviewed and are otherwise negative.  Past Medical History  Diagnosis Date  . Yeast infection   . Anemia   . H/O fatigue   . Fibroids   . H/O menorrhagia 2001  . Cyst   . Bartholin gland cyst 06/04/2000  . H/O dysmenorrhea 06/04/2000  .  Menopausal symptoms   . Diabetes mellitus   . Infertility, female   . Hypertension   . H/O: obesity   . Varicose vein   . Glaucoma   . FH: colon cancer      Past Surgical History  Procedure Date  . Dilation and curettage of uterus 1989  . Bartholin gland cyst excision 2010  . Cesarean section [id number]  . Tubal ligation 1996    bilateral    Home Meds:  Prior to Admission medications   Medication Sig Start Date End Date Taking? Authorizing Provider  cyclobenzaprine (FLEXERIL) 10 MG tablet Take 10 mg by mouth 3 (three) times daily as needed.   Yes Historical Provider, MD  glucose blood (ONE TOUCH ULTRA TEST) test strip Use as instructed 11/15/11  Yes Elvina Sidle, MD  magnesium 30 MG tablet Take 30 mg by mouth 2 (two) times daily.   Yes Historical Provider, MD  metoprolol succinate (TOPROL-XL) 100 MG 24 hr tablet Take 1 tablet (100 mg total) by mouth daily. Take with or immediately following a meal. 11/15/11  Yes Elvina Sidle, MD  montelukast (SINGULAIR) 10 MG tablet Take 1 tablet (10 mg total) by mouth at bedtime. 11/15/11  Yes Elvina Sidle, MD  Multiple Vitamin (MULTIVITAMIN) tablet Take 1 tablet by mouth daily.   Yes Historical Provider, MD  sitaGLIPtan-metformin (JANUMET) 50-1000 MG per tablet Take 1 tablet by mouth 2 (two) times daily with a meal. 11/15/11  Yes Elvina Sidle, MD  Travoprost, BAK Free, (TRAVATAN) 0.004 % SOLN ophthalmic solution 1 drop at bedtime.   Yes Historical Provider, MD  traZODone (DESYREL) 100 MG tablet Take 1 tablet (100 mg total) by mouth at bedtime. 11/15/11  Yes Elvina Sidle, MD  valsartan-hydrochlorothiazide (DIOVAN-HCT) 160-12.5 MG per tablet Take 1 tablet by mouth daily. 11/15/11  Yes Elvina Sidle, MD  zolpidem (AMBIEN) 10 MG  tablet Take 1 tablet (10 mg total) by mouth at bedtime as needed for sleep. 08/20/11 09/19/11  Kirkland Hun, MD    Allergies: No Known Allergies  History   Social History  . Marital Status: Married    Spouse  Name: N/A    Number of Children: N/A  . Years of Education: N/A   Occupational History  . Not on file.   Social History Main Topics  . Smoking status: Never Smoker   . Smokeless tobacco: Never Used  . Alcohol Use: No  . Drug Use: No  . Sexually Active: Yes -- Female partner(s)   Other Topics Concern  . Not on file   Social History Narrative  . No narrative on file    Family History  Problem Relation Age of Onset  . Cancer Father   . Cancer Mother     Physical Exam: 132/88 recheck Blood pressure 158/92, pulse 71, temperature 98.3 F (36.8 C), temperature source Oral, resp. rate 16, height 5\' 4"  (1.626 m), weight 185 lb (83.915 kg), SpO2 98.00%., Body mass index is 31.76 kg/(m^2). General: Well developed, well nourished, in no acute distress. HEENT: Normocephalic, atraumatic. Conjunctiva pink, sclera non-icteric. Pupils 2 mm constricting to 1 mm, round, regular, and equally reactive to light and accomodation. EOMI. Internal auditory canal clear. TMs with good cone of light and without pathology. Nasal mucosa pink. Nares are without discharge. No sinus tenderness. Oral mucosa pink. Dentition fair. Pharynx without exudate.   Neck: Supple. Trachea midline. No thyromegaly. Full ROM. No lymphadenopathy. Lungs: Clear to auscultation bilaterally without wheezes, rales, or rhonchi. Breathing is of normal effort and unlabored. Cardiovascular: RRR with S1 S2. No murmurs, rubs, or gallops appreciated. Distal pulses 2+ symmetrically. No carotid or abdominal bruits. Abdomen: Soft, non-tender, non-distended with normoactive bowel sounds. No hepatosplenomegaly or masses. No rebound/guarding. No CVA tenderness. Without hernias.  Musculoskeletal: Full range of motion and 5/5 strength throughout. Without swelling, atrophy, tenderness, crepitus, or warmth. Extremities without clubbing, cyanosis, or edema. Calves supple. Skin: Warm and moist without erythema, ecchymosis, wounds, or rash. Neuro: A+Ox3.  CN II-XII grossly intact. Moves all extremities spontaneously. Full sensation throughout. Normal gait. DTR 2+ throughout upper and lower extremities. Finger to nose intact. Psych:  Responds to questions appropriately with a normal affect.   Studies: CBC, CMET, Lipid UA:   Assessment/Plan:  56 y.o. y/o female here for CPE - 1. Healthcare maintenance  CBC, Comprehensive metabolic panel, Lipid panel, TSH, POCT urinalysis dipstick, POCT glycosylated hemoglobin (Hb A1C)  2. Diabetes type 2, controlled  Comprehensive metabolic panel, POCT glycosylated hemoglobin (Hb A1C)  3. Back pain  Ambulatory referral to General Surgery   Patient refuses flu shot  Signed, Elvina Sidle, MD 01/24/2012 9:27 AM

## 2012-01-25 ENCOUNTER — Telehealth: Payer: Self-pay | Admitting: Family Medicine

## 2012-01-25 NOTE — Telephone Encounter (Signed)
Left message on machine.

## 2012-03-19 ENCOUNTER — Other Ambulatory Visit: Payer: Self-pay | Admitting: *Deleted

## 2012-03-19 NOTE — Telephone Encounter (Signed)
Faxed form for diabetic testing supplies sent to [facility]

## 2012-04-16 ENCOUNTER — Other Ambulatory Visit: Payer: Self-pay | Admitting: Family Medicine

## 2012-04-17 ENCOUNTER — Telehealth: Payer: Self-pay

## 2012-04-17 ENCOUNTER — Telehealth: Payer: Self-pay | Admitting: Obstetrics and Gynecology

## 2012-04-17 DIAGNOSIS — I10 Essential (primary) hypertension: Secondary | ICD-10-CM

## 2012-04-17 MED ORDER — METOPROLOL SUCCINATE ER 100 MG PO TB24: 100.0000 mg | ORAL_TABLET | Freq: Every day | ORAL | Status: DC

## 2012-04-17 NOTE — Telephone Encounter (Signed)
Paper chart [place] [id number]

## 2012-04-17 NOTE — Telephone Encounter (Signed)
Pt is requesting a new script for 90 day supply of metropolol to [facility]   [phone]

## 2012-04-17 NOTE — Telephone Encounter (Signed)
I have left message to advise. Rx sent in.

## 2012-04-17 NOTE — Telephone Encounter (Signed)
[facility] pt 

## 2012-04-17 NOTE — Telephone Encounter (Signed)
Please pull paper chart.  

## 2012-04-25 ENCOUNTER — Other Ambulatory Visit: Payer: Self-pay

## 2012-04-25 DIAGNOSIS — G4700 Insomnia, unspecified: Secondary | ICD-10-CM

## 2012-04-25 MED ORDER — ZOLPIDEM TARTRATE 10 MG PO TABS: 10.0000 mg | ORAL_TABLET | Freq: Every evening | ORAL | Status: DC | PRN

## 2012-04-25 NOTE — Addendum Note (Signed)
Addended by: Janine Limbo on: 04/25/2012 05:09 PM   Modules accepted: Orders

## 2012-04-25 NOTE — Telephone Encounter (Signed)
Ambien 10 mg, 45 tabs, one refill , one po each night as needed for insomnia printed. Will mail to the patient.  Dr. Stefano Gaul

## 2012-04-25 NOTE — Telephone Encounter (Signed)
Message sent to [facility] to advise for Ambien Rx Refill.  [facility] CMA

## 2012-05-29 ENCOUNTER — Encounter: Payer: Self-pay | Admitting: Family Medicine

## 2012-05-29 ENCOUNTER — Ambulatory Visit (INDEPENDENT_AMBULATORY_CARE_PROVIDER_SITE_OTHER): Payer: 59 | Admitting: Family Medicine

## 2012-05-29 ENCOUNTER — Telehealth: Payer: Self-pay | Admitting: *Deleted

## 2012-05-29 VITALS — BP 166/82 | HR 67 | Temp 98.9°F | Resp 16 | Ht 64.0 in | Wt 180.0 lb

## 2012-05-29 DIAGNOSIS — G2581 Restless legs syndrome: Secondary | ICD-10-CM

## 2012-05-29 DIAGNOSIS — G4700 Insomnia, unspecified: Secondary | ICD-10-CM

## 2012-05-29 DIAGNOSIS — Z23 Encounter for immunization: Secondary | ICD-10-CM

## 2012-05-29 DIAGNOSIS — M25519 Pain in unspecified shoulder: Secondary | ICD-10-CM

## 2012-05-29 DIAGNOSIS — E119 Type 2 diabetes mellitus without complications: Secondary | ICD-10-CM

## 2012-05-29 DIAGNOSIS — I10 Essential (primary) hypertension: Secondary | ICD-10-CM

## 2012-05-29 DIAGNOSIS — IMO0001 Reserved for inherently not codable concepts without codable children: Secondary | ICD-10-CM

## 2012-05-29 LAB — COMPREHENSIVE METABOLIC PANEL
ALT: 14 U/L (ref 0–35)
AST: 15 U/L (ref 0–37)
Albumin: 4.4 g/dL (ref 3.5–5.2)
Alkaline Phosphatase: 88 U/L (ref 39–117)
BUN: 11 mg/dL (ref 6–23)
CO2: 28 mEq/L (ref 19–32)
Calcium: 9.5 mg/dL (ref 8.4–10.5)
Chloride: 103 mEq/L (ref 96–112)
Creat: 0.67 mg/dL (ref 0.50–1.10)
Glucose, Bld: 171 mg/dL — ABNORMAL HIGH (ref 70–99)
Potassium: 4.4 mEq/L (ref 3.5–5.3)
Sodium: 138 mEq/L (ref 135–145)
Total Bilirubin: 0.5 mg/dL (ref 0.3–1.2)
Total Protein: 7 g/dL (ref 6.0–8.3)

## 2012-05-29 MED ORDER — METOPROLOL SUCCINATE ER 100 MG PO TB24: 100.0000 mg | ORAL_TABLET | Freq: Every day | ORAL | Status: DC

## 2012-05-29 MED ORDER — CYCLOBENZAPRINE HCL 5 MG PO TABS: 5.0000 mg | ORAL_TABLET | Freq: Two times a day (BID) | ORAL | Status: DC | PRN

## 2012-05-29 MED ORDER — GLUCOSE BLOOD VI STRP: ORAL_STRIP | Status: DC

## 2012-05-29 MED ORDER — VALSARTAN-HYDROCHLOROTHIAZIDE 160-12.5 MG PO TABS: 1.0000 | ORAL_TABLET | Freq: Every day | ORAL | Status: DC

## 2012-05-29 MED ORDER — ZOLPIDEM TARTRATE 10 MG PO TABS: 10.0000 mg | ORAL_TABLET | Freq: Every evening | ORAL | Status: DC | PRN

## 2012-05-29 MED ORDER — ROPINIROLE HCL 2 MG PO TABS: 1.0000 mg | ORAL_TABLET | Freq: Every day | ORAL | Status: DC

## 2012-05-29 MED ORDER — TRAZODONE HCL 100 MG PO TABS: 100.0000 mg | ORAL_TABLET | Freq: Every day | ORAL | Status: DC

## 2012-05-29 MED ORDER — SITAGLIPTIN PHOS-METFORMIN HCL 50-1000 MG PO TABS: 1.0000 | ORAL_TABLET | Freq: Two times a day (BID) | ORAL | Status: DC

## 2012-05-29 NOTE — Telephone Encounter (Signed)
Called left message to call back concerning the provider for breast reduction, Dr Milus Glazier will make a referral to Dr Sherald Hess office.

## 2012-05-29 NOTE — Progress Notes (Signed)
.   Patient ID: Heidi Howell MRN: [id number], DOB: [birth year], 57 y.o. Date of Encounter: 05/29/2012, 9:03 AM  Primary Physician: Janine Limbo, MD  Chief Complaint: Diabetes follow up  HPI: 58 y.o. year old female with history below presents for follow up of diabetes mellitus. Doing well. Still noting shoulder and back pain.  Did not get referral for breast reduction last visit. Taking medications daily without adverse effects. No polydipsia, polyphagia, polyuria, or nocturia.  Blood sugars at home: 150-200  Diet consists of:  Reducing portions, has lost 5 # since last visit Exercising regularly. walking Last A1C:  8.2 in September  Eye MD: within last year Influenza vaccine: today Pneumococcal vaccine:  2010 Last microalbumin:  7/13  Past Medical History  Diagnosis Date  . Yeast infection   . Anemia   . H/O fatigue   . Fibroids   . H/O menorrhagia 2001  . Cyst   . Bartholin gland cyst 06/04/2000  . H/O dysmenorrhea 06/04/2000  . Menopausal symptoms   . Diabetes mellitus   . Infertility, female   . Hypertension   . H/O: obesity   . Varicose vein   . Glaucoma(365)   . FH: colon cancer      Home Meds: Prior to Admission medications   Medication Sig Start Date End Date Taking? Authorizing Provider  cyclobenzaprine (FLEXERIL) 5 MG tablet TAKE 1 TABLET DAILY AS NEEDED FOR MUSCLE STRAIN 04/16/12  Yes Ryan M Dunn, PA-C  glucose blood (ONE TOUCH ULTRA TEST) test strip Use as instructed 05/29/12  Yes Elvina Sidle, MD  metoprolol succinate (TOPROL-XL) 100 MG 24 hr tablet Take 1 tablet (100 mg total) by mouth daily. Take with or immediately following a meal. 05/29/12  Yes Elvina Sidle, MD  montelukast (SINGULAIR) 10 MG tablet Take 1 tablet (10 mg total) by mouth at bedtime. 11/15/11  Yes Elvina Sidle, MD  Multiple Vitamin (MULTIVITAMIN) tablet Take 1 tablet by mouth daily.   Yes Historical Provider, MD  rOPINIRole (REQUIP) 2 MG tablet Take 0.5 tablets (1 mg total) by  mouth at bedtime. 05/29/12  Yes Elvina Sidle, MD  sitaGLIPtan-metformin (JANUMET) 50-1000 MG per tablet Take 1 tablet by mouth 2 (two) times daily with a meal. 05/29/12  Yes Elvina Sidle, MD  Travoprost, BAK Free, (TRAVATAN) 0.004 % SOLN ophthalmic solution 1 drop at bedtime.   Yes Historical Provider, MD  traZODone (DESYREL) 100 MG tablet Take 1 tablet (100 mg total) by mouth at bedtime. 05/29/12  Yes Elvina Sidle, MD  valsartan-hydrochlorothiazide (DIOVAN-HCT) 160-12.5 MG per tablet Take 1 tablet by mouth daily. 05/29/12  Yes Elvina Sidle, MD  cyclobenzaprine (FLEXERIL) 10 MG tablet Take 10 mg by mouth 3 (three) times daily as needed.    Historical Provider, MD  magnesium 30 MG tablet Take 30 mg by mouth 2 (two) times daily.    Historical Provider, MD  zolpidem (AMBIEN) 10 MG tablet Take 1 tablet (10 mg total) by mouth at bedtime as needed for sleep. 05/29/12 06/28/12  Elvina Sidle, MD    Allergies: No Known Allergies  History   Social History  . Marital Status: Married    Spouse Name: N/A    Number of Children: N/A  . Years of Education: N/A   Occupational History  . Not on file.   Social History Main Topics  . Smoking status: Never Smoker   . Smokeless tobacco: Never Used  . Alcohol Use: No  . Drug Use: No  . Sexually Active: Yes -- Female  partner(s)     Comment: number of sex partners in the last 12 months 1   Other Topics Concern  . Not on file   Social History Narrative   Exercise walk 2-3 times for 30-40 minutes     Review of Systems: Constitutional: negative for chills, fever, night sweats, weight changes, or fatigue  HEENT: negative for vision changes, hearing loss, congestion, rhinorrhea, or epistaxis Cardiovascular: negative for chest pain, palpitations, diaphoresis, DOE, orthopnea, or edema Respiratory: negative for hemoptysis, wheezing, shortness of breath, dyspnea, or cough Abdominal: negative for abdominal pain, nausea, vomiting, diarrhea, or  constipation Dermatological: negative for rash, erythema, or wounds Neurologic: negative for headache, dizziness, or syncope Renal:  Negative for polyuria, polydipsia, or dysuria All other systems reviewed and are otherwise negative with the exception to those above and in the HPI.   Physical Exam: Blood pressure 166/82, pulse 67, temperature 98.9 F (37.2 C), temperature source Oral, resp. rate 16, height 5\' 4"  (1.626 m), weight 180 lb (81.647 kg), last menstrual period 01/15/2011, SpO2 100.00%., Body mass index is 30.90 kg/(m^2). General: Well developed, well nourished, in no acute distress. Head: Normocephalic, atraumatic, eyes without discharge, sclera non-icteric, nares are without discharge. Bilateral auditory canals clear, TM's are without perforation, pearly grey and translucent with reflective cone of light bilaterally. Oral cavity moist, posterior pharynx without exudate, erythema, peritonsillar abscess, or post nasal drip.  Neck: Supple. No thyromegaly. Full ROM. No lymphadenopathy. Lungs: Clear bilaterally to auscultation without wheezes, rales, or rhonchi. Breathing is unlabored. Heart: RRR with S1 S2. No murmurs, rubs, or gallops appreciated. Abdomen: Soft, non-tender, non-distended with normoactive bowel sounds. No hepatosplenomegaly. No rebound/guarding. No obvious abdominal masses. Msk:  Strength and tone normal for age. Extremities/Skin: Warm and dry. No clubbing or cyanosis. No edema. No rashes, wounds, or suspicious lesions. Monofilament exam unremarkable bilaterally.  Neuro: Alert and oriented X 3. Moves all extremities spontaneously. Gait is normal. CNII-XII grossly in tact. Psych:  Responds to questions appropriately with a normal affect.   Labs: Results for orders placed in visit on 01/24/12  CBC      Component Value Range   WBC 8.4  4.0 - 10.5 K/uL   RBC 4.67  3.87 - 5.11 MIL/uL   Hemoglobin 12.6  12.0 - 15.0 g/dL   HCT 16.1  09.6 - 04.5 %   MCV 79.7  78.0 -  100.0 fL   MCH 27.0  26.0 - 34.0 pg   MCHC 33.9  30.0 - 36.0 g/dL   RDW 40.9  81.1 - 91.4 %   Platelets 339  150 - 400 K/uL  COMPREHENSIVE METABOLIC PANEL      Component Value Range   Sodium 136  135 - 145 mEq/L   Potassium 4.5  3.5 - 5.3 mEq/L   Chloride 103  96 - 112 mEq/L   CO2 29  19 - 32 mEq/L   Glucose, Bld 170 (*) 70 - 99 mg/dL   BUN 12  6 - 23 mg/dL   Creat 7.82  9.56 - 2.13 mg/dL   Total Bilirubin 0.5  0.3 - 1.2 mg/dL   Alkaline Phosphatase 66  39 - 117 U/L   AST 19  0 - 37 U/L   ALT 24  0 - 35 U/L   Total Protein 7.4  6.0 - 8.3 g/dL   Albumin 4.5  3.5 - 5.2 g/dL   Calcium 9.9  8.4 - 08.6 mg/dL  LIPID PANEL      Component Value Range  Cholesterol 173  0 - 200 mg/dL   Triglycerides 81  <161 mg/dL   HDL 58  >09 mg/dL   Total CHOL/HDL Ratio 3.0     VLDL 16  0 - 40 mg/dL   LDL Cholesterol 99  0 - 99 mg/dL  TSH      Component Value Range   TSH 0.738  0.350 - 4.500 uIU/mL  POCT GLYCOSYLATED HEMOGLOBIN (HGB A1C)      Component Value Range   Hemoglobin A1C 8.2        ASSESSMENT AND PLAN:  57 y.o. year old female with type 2 diabetes.  She has shown reasonable weight loss over the most difficult time of the year. 1. Type 2 diabetes mellitus  Comprehensive metabolic panel, POCT glycosylated hemoglobin (Hb A1C), glucose blood (ONE TOUCH ULTRA TEST) test strip  2. Need for prophylactic vaccination and inoculation against influenza  Flu vaccine greater than or equal to 3yo preservative free IM  3. Diabetes mellitus  glucose blood (ONE TOUCH ULTRA TEST) test strip, sitaGLIPtan-metformin (JANUMET) 50-1000 MG per tablet, traZODone (DESYREL) 100 MG tablet  4. Hypertension  metoprolol succinate (TOPROL-XL) 100 MG 24 hr tablet, valsartan-hydrochlorothiazide (DIOVAN-HCT) 160-12.5 MG per tablet  5. Restless legs  rOPINIRole (REQUIP) 2 MG tablet  6. Insomnia  zolpidem (AMBIEN) 10 MG tablet  7. Insomnia Chronic zolpidem (AMBIEN) 10 MG tablet  8. Shoulder pain     Will again try  to refer for breast reduction therapy Recheck 4 months. -  Signed, Elvina Sidle, MD 05/29/2012 9:03 AM

## 2012-05-29 NOTE — Progress Notes (Signed)
Patient ID: Heidi Howell MRN: [id number], DOB: [birth year], 57 y.o. Date of Encounter: 05/29/2012, 8:52 AM  Primary Physician: Janine Limbo, MD  Chief Complaint: Diabetes follow up  HPI: 57 y.o. year old female with history below presents for follow up of diabetes mellitus. Doing well. No issues or complaints. Taking medications daily without adverse effects. No polydipsia, polyphagia, polyuria, or nocturia.  Blood sugars at home:  150-200 Diet consists of:  Decreased portions Exercising regularly. walking Last A1C:  8.2  Eye MD: last year Influenza vaccine: tpdau Pneumococcal vaccine: 8/10 Last microalb:  7/13  Past Medical History  Diagnosis Date  . Yeast infection   . Anemia   . H/O fatigue   . Fibroids   . H/O menorrhagia 2001  . Cyst   . Bartholin gland cyst 06/04/2000  . H/O dysmenorrhea 06/04/2000  . Menopausal symptoms   . Diabetes mellitus   . Infertility, female   . Hypertension   . H/O: obesity   . Varicose vein   . Glaucoma(365)   . FH: colon cancer      Home Meds: Prior to Admission medications   Medication Sig Start Date End Date Taking? Authorizing Provider  cyclobenzaprine (FLEXERIL) 5 MG tablet TAKE 1 TABLET DAILY AS NEEDED FOR MUSCLE STRAIN 04/16/12  Yes Ryan M Dunn, PA-C  glucose blood (ONE TOUCH ULTRA TEST) test strip Use as instructed 11/15/11  Yes Elvina Sidle, MD  metoprolol succinate (TOPROL-XL) 100 MG 24 hr tablet Take 1 tablet (100 mg total) by mouth daily. Take with or immediately following a meal. 04/17/12  Yes Elvina Sidle, MD  montelukast (SINGULAIR) 10 MG tablet Take 1 tablet (10 mg total) by mouth at bedtime. 11/15/11  Yes Elvina Sidle, MD  Multiple Vitamin (MULTIVITAMIN) tablet Take 1 tablet by mouth daily.   Yes Historical Provider, MD  rOPINIRole (REQUIP) 2 MG tablet TAKE 1 TABLET AT BEDTIME 04/16/12  Yes Ryan M Dunn, PA-C  sitaGLIPtan-metformin (JANUMET) 50-1000 MG per tablet Take 1 tablet by mouth 2 (two) times daily  with a meal. 11/15/11  Yes Elvina Sidle, MD  Travoprost, BAK Free, (TRAVATAN) 0.004 % SOLN ophthalmic solution 1 drop at bedtime.   Yes Historical Provider, MD  traZODone (DESYREL) 100 MG tablet Take 1 tablet (100 mg total) by mouth at bedtime. 11/15/11  Yes Elvina Sidle, MD  valsartan-hydrochlorothiazide (DIOVAN-HCT) 160-12.5 MG per tablet Take 1 tablet by mouth daily. 11/15/11  Yes Elvina Sidle, MD  cyclobenzaprine (FLEXERIL) 10 MG tablet Take 10 mg by mouth 3 (three) times daily as needed.    Historical Provider, MD  magnesium 30 MG tablet Take 30 mg by mouth 2 (two) times daily.    Historical Provider, MD  zolpidem (AMBIEN) 10 MG tablet Take 1 tablet (10 mg total) by mouth at bedtime as needed for sleep. 04/25/12 05/25/12  Kirkland Hun, MD    Allergies: No Known Allergies  History   Social History  . Marital Status: Married    Spouse Name: N/A    Number of Children: N/A  . Years of Education: N/A   Occupational History  . Not on file.   Social History Main Topics  . Smoking status: Never Smoker   . Smokeless tobacco: Never Used  . Alcohol Use: No  . Drug Use: No  . Sexually Active: Yes -- Female partner(s)     Comment: number of sex partners in the last 12 months 1   Other Topics Concern  . Not on file   Social  History Narrative   Exercise walk 2-3 times for 30-40 minutes     Review of Systems: Constitutional: negative for chills, fever, night sweats, weight changes, or fatigue  HEENT: negative for vision changes, hearing loss, congestion, rhinorrhea, or epistaxis Cardiovascular: negative for chest pain, palpitations, diaphoresis, DOE, orthopnea, or edema Respiratory: negative for hemoptysis, wheezing, shortness of breath, dyspnea, or cough Abdominal: negative for abdominal pain, nausea, vomiting, diarrhea, or constipation Dermatological: negative for rash, erythema, or wounds Neurologic: negative for headache, dizziness, or syncope Renal:  Negative for  polyuria, polydipsia, or dysuria All other systems reviewed and are otherwise negative with the exception to those above and in the HPI.   Physical Exam: Blood pressure 166/82, pulse 67, temperature 98.9 F (37.2 C), temperature source Oral, resp. rate 16, height 5\' 4"  (1.626 m), weight 180 lb (81.647 kg), last menstrual period 01/15/2011, SpO2 100.00%., Body mass index is 30.90 kg/(m^2). General: Well developed, well nourished, in no acute distress. Head: Normocephalic, atraumatic, eyes without discharge, sclera non-icteric, nares are without discharge. Bilateral auditory canals clear, TM's are without perforation, pearly grey and translucent with reflective cone of light bilaterally. Oral cavity moist, posterior pharynx without exudate, erythema, peritonsillar abscess, or post nasal drip.  Neck: Supple. No thyromegaly. Full ROM. No lymphadenopathy. Lungs: Clear bilaterally to auscultation without wheezes, rales, or rhonchi. Breathing is unlabored. Heart: RRR with S1 S2. No murmurs, rubs, or gallops appreciated. Abdomen: Soft, non-tender, non-distended with normoactive bowel sounds. No hepatosplenomegaly. No rebound/guarding. No obvious abdominal masses. Msk:  Strength and tone normal for age. Extremities/Skin: Warm and dry. No clubbing or cyanosis. No edema. No rashes, wounds, or suspicious lesions. Monofilament exam unremarkable bilaterally.  Neuro: Alert and oriented X 3. Moves all extremities spontaneously. Gait is normal. CNII-XII grossly in tact. Psych:  Responds to questions appropriately with a normal affect.   Labs:   ASSESSMENT AND PLAN:  57 y.o. year old female with  -  Signed, Elvina Sidle, MD 05/29/2012 8:52 AM

## 2012-05-29 NOTE — Patient Instructions (Addendum)

## 2012-06-05 ENCOUNTER — Telehealth: Payer: Self-pay

## 2012-06-05 NOTE — Telephone Encounter (Signed)
I received a call from [facility] (mail order) asking for verbal authorization to fill pt's glucose testing strips and stated they have been filling pt's supplies for over a year. Pt was just seen for DM check and gave OK for 1 year RFs. Called pt to verify that she does want Rx going to his pharmacy because we had sent in a Rx to [facility] last week. Pt stated that she had previously decided to try this pharmacy but has NOT used them before. She stated that she is now going to call them to cancel the order and continue to get them through [facility]. Also gave pt her lab results.

## 2012-07-01 DIAGNOSIS — Z0271 Encounter for disability determination: Secondary | ICD-10-CM

## 2012-08-01 ENCOUNTER — Other Ambulatory Visit: Payer: Self-pay | Admitting: Family Medicine

## 2012-08-21 ENCOUNTER — Other Ambulatory Visit: Payer: Self-pay | Admitting: Obstetrics and Gynecology

## 2012-08-21 DIAGNOSIS — Z1231 Encounter for screening mammogram for malignant neoplasm of breast: Secondary | ICD-10-CM

## 2012-08-28 ENCOUNTER — Encounter: Payer: Self-pay | Admitting: Family Medicine

## 2012-08-28 ENCOUNTER — Ambulatory Visit (INDEPENDENT_AMBULATORY_CARE_PROVIDER_SITE_OTHER): Payer: 59 | Admitting: Family Medicine

## 2012-08-28 VITALS — BP 180/88 | HR 71 | Temp 98.5°F | Resp 16 | Ht 64.0 in | Wt 186.8 lb

## 2012-08-28 DIAGNOSIS — IMO0001 Reserved for inherently not codable concepts without codable children: Secondary | ICD-10-CM

## 2012-08-28 DIAGNOSIS — G4700 Insomnia, unspecified: Secondary | ICD-10-CM

## 2012-08-28 LAB — COMPREHENSIVE METABOLIC PANEL
ALT: 15 U/L (ref 0–35)
AST: 16 U/L (ref 0–37)
Albumin: 4 g/dL (ref 3.5–5.2)
Alkaline Phosphatase: 77 U/L (ref 39–117)
BUN: 9 mg/dL (ref 6–23)
CO2: 29 mEq/L (ref 19–32)
Calcium: 9.5 mg/dL (ref 8.4–10.5)
Chloride: 102 mEq/L (ref 96–112)
Creat: 0.73 mg/dL (ref 0.50–1.10)
Glucose, Bld: 155 mg/dL — ABNORMAL HIGH (ref 70–99)
Potassium: 4.1 mEq/L (ref 3.5–5.3)
Sodium: 137 mEq/L (ref 135–145)
Total Bilirubin: 0.5 mg/dL (ref 0.3–1.2)
Total Protein: 6.9 g/dL (ref 6.0–8.3)

## 2012-08-28 LAB — POCT GLYCOSYLATED HEMOGLOBIN (HGB A1C): Hemoglobin A1C: 9.6

## 2012-08-28 MED ORDER — TRAZODONE HCL 150 MG PO TABS: 150.0000 mg | ORAL_TABLET | Freq: Every day | ORAL | Status: DC

## 2012-08-28 NOTE — Patient Instructions (Addendum)

## 2012-08-28 NOTE — Progress Notes (Signed)
57 yo married woman at [employer] with chronically elevated blood sugars comes in with husband for review of diabetes.  She has been under great personal stress with family members needing medical assistance.  Hence, she has not lost weight as promised.  She continues her medications as directed with no new physical symptoms. She is not sleeping well and requests something more be done for this.  Objective:  NAD.  Appears cheerful HEENT:  Unremarkable Chest:  Clear Heart:  Regular Extremities:  No edema, feet have normal sensation and there are no rashes or calluses  Results for orders placed in visit on 08/28/12  POCT GLYCOSYLATED HEMOGLOBIN (HGB A1C)      Result Value Range   Hemoglobin A1C 9.6     Assessment:  Continued poor glycemic control.  Patient and husband urged to exercise and reduce weight.  Plan:  CMET pending.  Will write letter urging patient to do more for her health. Recheck 3 months. Insomnia - Plan: traZODone (DESYREL) 150 MG tablet  Type II or unspecified type diabetes mellitus without mention of complication, uncontrolled - Plan: Comprehensive metabolic panel, POCT glycosylated hemoglobin (Hb A1C)

## 2012-09-02 ENCOUNTER — Encounter: Payer: Self-pay | Admitting: Radiology

## 2012-09-11 ENCOUNTER — Ambulatory Visit (HOSPITAL_COMMUNITY)
Admission: RE | Admit: 2012-09-11 | Discharge: 2012-09-11 | Disposition: A | Discharge status: Home / Self Care | Payer: 59 | Source: Ambulatory Visit | Attending: Obstetrics and Gynecology | Admitting: Obstetrics and Gynecology

## 2012-09-11 DIAGNOSIS — Z1231 Encounter for screening mammogram for malignant neoplasm of breast: Secondary | ICD-10-CM

## 2012-10-10 ENCOUNTER — Ambulatory Visit: Admission: RE | Admit: 2012-10-10 | Payer: 59 | Source: Ambulatory Visit

## 2012-10-10 ENCOUNTER — Other Ambulatory Visit: Payer: Self-pay | Admitting: Obstetrics and Gynecology

## 2012-10-10 ENCOUNTER — Ambulatory Visit
Admission: RE | Admit: 2012-10-10 | Discharge: 2012-10-10 | Disposition: A | Discharge status: Home / Self Care | Payer: 59 | Source: Ambulatory Visit | Attending: Obstetrics and Gynecology | Admitting: Obstetrics and Gynecology

## 2012-10-10 DIAGNOSIS — Z1231 Encounter for screening mammogram for malignant neoplasm of breast: Secondary | ICD-10-CM

## 2012-11-20 ENCOUNTER — Ambulatory Visit: Payer: 59

## 2012-11-20 ENCOUNTER — Encounter: Payer: Self-pay | Admitting: Family Medicine

## 2012-11-20 ENCOUNTER — Ambulatory Visit (INDEPENDENT_AMBULATORY_CARE_PROVIDER_SITE_OTHER): Payer: 59 | Admitting: Family Medicine

## 2012-11-20 ENCOUNTER — Other Ambulatory Visit: Payer: Self-pay | Admitting: Family Medicine

## 2012-11-20 VITALS — BP 162/82 | HR 88 | Temp 98.2°F | Resp 16 | Ht 64.5 in | Wt 181.0 lb

## 2012-11-20 DIAGNOSIS — R059 Cough, unspecified: Secondary | ICD-10-CM

## 2012-11-20 DIAGNOSIS — I10 Essential (primary) hypertension: Secondary | ICD-10-CM

## 2012-11-20 DIAGNOSIS — R05 Cough: Secondary | ICD-10-CM

## 2012-11-20 DIAGNOSIS — G4700 Insomnia, unspecified: Secondary | ICD-10-CM

## 2012-11-20 DIAGNOSIS — E119 Type 2 diabetes mellitus without complications: Secondary | ICD-10-CM

## 2012-11-20 LAB — POCT GLYCOSYLATED HEMOGLOBIN (HGB A1C): Hemoglobin A1C: 8.7

## 2012-11-20 LAB — GLUCOSE, POCT (MANUAL RESULT ENTRY): POC Glucose: 269 mg/dl — AB (ref 70–99)

## 2012-11-20 MED ORDER — VALSARTAN-HYDROCHLOROTHIAZIDE 160-12.5 MG PO TABS: 1.0000 | ORAL_TABLET | Freq: Every day | ORAL | Status: DC

## 2012-11-20 MED ORDER — ZOLPIDEM TARTRATE 10 MG PO TABS: 10.0000 mg | ORAL_TABLET | Freq: Every evening | ORAL | Status: DC | PRN

## 2012-11-20 MED ORDER — INSULIN GLARGINE 100 UNIT/ML SOLOSTAR PEN: 20.0000 [IU] | PEN_INJECTOR | Freq: Every day | SUBCUTANEOUS | Status: DC

## 2012-11-20 MED ORDER — FLUTICASONE-SALMETEROL 100-50 MCG/DOSE IN AEPB: 1.0000 | INHALATION_SPRAY | Freq: Two times a day (BID) | RESPIRATORY_TRACT | Status: DC

## 2012-11-20 NOTE — Progress Notes (Signed)
57 yo diabetic woman.  She ran out of meds about two weeks ago.  She doesn't have the money to afford her medications.  Walking more frequently and did lose 5# No nocturia.  Some polydipsia Patient works for the [employer] on [address]. No problems at work.  Patient notes no skin changes.  Patient does complain about dry cough which has been diagnosed as asthma in the past. She thinks it may have to do with taking trazodone at night, thinking it may be drying her mouth.   Objective:  146/90 patient seen with husband who insists they are going to work harder at losing weight  HEENT:  Unremarkable Neck:  Supple Chest:  Clear Heart: regular, no murmur Ext:  Good pulses, no skin breaks, no edema Results for orders placed in visit on 11/20/12  GLUCOSE, POCT (MANUAL RESULT ENTRY)      Result Value Range   POC Glucose 269 (*) 70 - 99 mg/dl  POCT GLYCOSYLATED HEMOGLOBIN (HGB A1C)      Result Value Range   Hemoglobin A1C 8.7       [facility] reading (PRIMARY) by  Dr. Milus Glazier:  CXR normal  Assessment: Diabetes not well controlled, chronic cough probably secondary to asthma, intrinsic. Insomnia not well controlled.  Plan: Discontinue trazodone, recheck 1 month Cough - Plan: DG Chest 2 View, Fluticasone-Salmeterol (ADVAIR) 100-50 MCG/DOSE AEPB  Type 2 diabetes mellitus not at goal - Plan: POCT glucose (manual entry), POCT glycosylated hemoglobin (Hb A1C), Insulin Glargine (LANTUS SOLOSTAR) 100 UNIT/ML SOPN  Hypertension - Plan: valsartan-hydrochlorothiazide (DIOVAN-HCT) 160-12.5 MG per tablet  Insomnia - Plan: zolpidem (AMBIEN) 10 MG tablet  Signed, Elvina Sidle, MD  .

## 2012-11-26 ENCOUNTER — Telehealth: Payer: Self-pay

## 2012-11-26 NOTE — Telephone Encounter (Signed)
Pt states that she was prescribed the lantus pens but was not prescribed the needles, she would like them to be called into [facility] on [address]. Best# [phone]

## 2012-11-27 ENCOUNTER — Ambulatory Visit: Payer: 59 | Admitting: Family Medicine

## 2012-11-27 MED ORDER — INSULIN PEN NEEDLE 31G X 5 MM MISC: Status: DC

## 2012-12-25 ENCOUNTER — Ambulatory Visit (INDEPENDENT_AMBULATORY_CARE_PROVIDER_SITE_OTHER): Payer: 59 | Admitting: Family Medicine

## 2012-12-25 ENCOUNTER — Encounter: Payer: Self-pay | Admitting: Family Medicine

## 2012-12-25 VITALS — BP 140/90 | HR 73 | Temp 98.6°F | Resp 16 | Ht 64.5 in | Wt 184.8 lb

## 2012-12-25 DIAGNOSIS — B351 Tinea unguium: Secondary | ICD-10-CM

## 2012-12-25 DIAGNOSIS — R05 Cough: Secondary | ICD-10-CM

## 2012-12-25 DIAGNOSIS — R059 Cough, unspecified: Secondary | ICD-10-CM

## 2012-12-25 DIAGNOSIS — E119 Type 2 diabetes mellitus without complications: Secondary | ICD-10-CM

## 2012-12-25 MED ORDER — FLUTICASONE-SALMETEROL 100-50 MCG/DOSE IN AEPB: 1.0000 | INHALATION_SPRAY | Freq: Two times a day (BID) | RESPIRATORY_TRACT | Status: DC

## 2012-12-25 NOTE — Progress Notes (Signed)
57 year old diabetic woman who was seen last month with a cough. She was given Advair and the cough is pretty much resolved. She's also had some problems with her feet as well as poorly controlled diabetes.  Her feet and somewhat sore recently with concern about her nail on the left foot. She's removed probably nail polish.  Patient still not active on regular basis. Her sugars have not changed significantly.  Objective: HEENT-unremarkable Chest: Clear Heart: Regular no murmur Neck: Supple no adenopathy Extremities: Good pedal pulses, no rashes or new skin lesions, minimal onychomycosis left small toe, bunions bilaterally.  Assessment: Stable at this point although I would like her to exercise more  Plan: The followup 6 weeks Advair 50-100 twice a day when necessary  Signed, Sheila Oats.D.

## 2013-02-26 ENCOUNTER — Encounter: Payer: Self-pay | Admitting: Family Medicine

## 2013-02-26 ENCOUNTER — Ambulatory Visit (INDEPENDENT_AMBULATORY_CARE_PROVIDER_SITE_OTHER): Payer: Managed Care, Other (non HMO) | Admitting: Family Medicine

## 2013-02-26 VITALS — BP 170/92 | HR 70 | Temp 98.6°F | Resp 16 | Ht 64.0 in | Wt 189.0 lb

## 2013-02-26 DIAGNOSIS — Z0000 Encounter for general adult medical examination without abnormal findings: Secondary | ICD-10-CM

## 2013-02-26 DIAGNOSIS — Z23 Encounter for immunization: Secondary | ICD-10-CM

## 2013-02-26 DIAGNOSIS — I10 Essential (primary) hypertension: Secondary | ICD-10-CM

## 2013-02-26 DIAGNOSIS — M25511 Pain in right shoulder: Secondary | ICD-10-CM

## 2013-02-26 DIAGNOSIS — G2581 Restless legs syndrome: Secondary | ICD-10-CM

## 2013-02-26 DIAGNOSIS — E119 Type 2 diabetes mellitus without complications: Secondary | ICD-10-CM

## 2013-02-26 DIAGNOSIS — Z9109 Other allergy status, other than to drugs and biological substances: Secondary | ICD-10-CM

## 2013-02-26 LAB — LIPID PANEL
Cholesterol: 144 mg/dL (ref 0–200)
HDL: 53 mg/dL (ref >39)
LDL Cholesterol: 76 mg/dL (ref 0–99)
Total CHOL/HDL Ratio: 2.7 Ratio
Triglycerides: 75 mg/dL (ref <150)
VLDL: 15 mg/dL (ref 0–40)

## 2013-02-26 LAB — COMPREHENSIVE METABOLIC PANEL
ALT: 18 U/L (ref 0–35)
AST: 17 U/L (ref 0–37)
Albumin: 4.1 g/dL (ref 3.5–5.2)
Alkaline Phosphatase: 63 U/L (ref 39–117)
BUN: 10 mg/dL (ref 6–23)
CO2: 29 mEq/L (ref 19–32)
Calcium: 9.5 mg/dL (ref 8.4–10.5)
Chloride: 103 mEq/L (ref 96–112)
Creat: 0.8 mg/dL (ref 0.50–1.10)
Glucose, Bld: 141 mg/dL — ABNORMAL HIGH (ref 70–99)
Potassium: 4.5 mEq/L (ref 3.5–5.3)
Sodium: 138 mEq/L (ref 135–145)
Total Bilirubin: 0.5 mg/dL (ref 0.3–1.2)
Total Protein: 6.9 g/dL (ref 6.0–8.3)

## 2013-02-26 LAB — TSH: TSH: 1.559 u[IU]/mL (ref 0.350–4.500)

## 2013-02-26 LAB — POCT GLYCOSYLATED HEMOGLOBIN (HGB A1C): Hemoglobin A1C: 8.4

## 2013-02-26 LAB — POCT URINALYSIS DIPSTICK
Bilirubin, UA: NEGATIVE
Blood, UA: NEGATIVE
Glucose, UA: NEGATIVE
Ketones, UA: NEGATIVE
Nitrite, UA: NEGATIVE
Protein, UA: NEGATIVE
Spec Grav, UA: 1.02
Urobilinogen, UA: 0.2
pH, UA: 7

## 2013-02-26 MED ORDER — CYCLOBENZAPRINE HCL 5 MG PO TABS: 5.0000 mg | ORAL_TABLET | Freq: Two times a day (BID) | ORAL | Status: DC | PRN

## 2013-02-26 MED ORDER — DICLOFENAC SODIUM 75 MG PO TBEC: 75.0000 mg | DELAYED_RELEASE_TABLET | Freq: Two times a day (BID) | ORAL | Status: DC

## 2013-02-26 MED ORDER — ROPINIROLE HCL 2 MG PO TABS: 1.0000 mg | ORAL_TABLET | Freq: Every day | ORAL | Status: DC

## 2013-02-26 MED ORDER — SITAGLIPTIN PHOS-METFORMIN HCL 50-1000 MG PO TABS: 1.0000 | ORAL_TABLET | Freq: Two times a day (BID) | ORAL | Status: DC

## 2013-02-26 MED ORDER — INSULIN GLARGINE 100 UNIT/ML SOLOSTAR PEN: 20.0000 [IU] | PEN_INJECTOR | Freq: Every day | SUBCUTANEOUS | Status: DC

## 2013-02-26 MED ORDER — METOPROLOL SUCCINATE ER 100 MG PO TB24: 100.0000 mg | ORAL_TABLET | Freq: Every day | ORAL | Status: DC

## 2013-02-26 MED ORDER — VALSARTAN-HYDROCHLOROTHIAZIDE 160-12.5 MG PO TABS: 1.0000 | ORAL_TABLET | Freq: Every day | ORAL | Status: DC

## 2013-02-26 MED ORDER — MONTELUKAST SODIUM 10 MG PO TABS: 10.0000 mg | ORAL_TABLET | Freq: Every day | ORAL | Status: DC

## 2013-02-26 MED ORDER — INSULIN PEN NEEDLE 31G X 5 MM MISC: Status: DC

## 2013-02-26 NOTE — Patient Instructions (Signed)

## 2013-02-26 NOTE — Progress Notes (Signed)
  Subjective:    Patient ID: Heidi Howell, female    DOB: 1956-05-06, 57 y.o.   MRN: [id number]  HPI    Review of Systems  Constitutional: Positive for unexpected weight change.  HENT: Negative.   Eyes: Negative.   Respiratory: Negative.   Cardiovascular: Negative.   Gastrointestinal: Negative.   Endocrine: Negative.   Genitourinary: Negative.   Musculoskeletal: Positive for myalgias.  Skin: Negative.   Allergic/Immunologic: Negative.   Neurological: Negative.   Hematological: Negative.   Psychiatric/Behavioral: Negative.        Objective:   Physical Exam        Assessment & Plan:

## 2013-02-26 NOTE — Progress Notes (Signed)
Patient ID: Heidi Howell MRN: [id number], DOB: 21-Oct-1955, 57 y.o. Date of Encounter: 02/26/2013, 8:41 AM  Primary Physician: Janine Limbo, MD  Chief Complaint: Physical (CPE)  HPI: 57 y.o. y/o female with history of noted below here for CPE.  Doing well.  Works at the post [employer].  Married.  One son in college at [employer].  Blood  Sugars:  Elevated over 200.  Colonoscopy:  2 months ago Pap:  Dr. Stefano Gaul MMG: this year Review of Systems: Consitutional: No fever, chills, fatigue, night sweats, lymphadenopathy, or weight changes. Eyes: No visual changes, eye redness, or discharge. ENT/Mouth: Ears: No otalgia, tinnitus, hearing loss, discharge. Nose: No congestion, rhinorrhea, sinus pain, or epistaxis. Throat: No sore throat, post nasal drip, or teeth pain. Cardiovascular: No CP, palpitations, diaphoresis, DOE, edema, orthopnea, PND. Respiratory: No cough, hemoptysis, SOB, or wheezing. Gastrointestinal: No anorexia, dysphagia, reflux, pain, nausea, vomiting, hematemesis, diarrhea, constipation, BRBPR, or melena. Breast: No discharge, pain, swelling, or mass. Genitourinary: No dysuria, frequency, urgency, hematuria, incontinence, nocturia, amenorrhea, vaginal discharge, pruritis, burning, abnormal bleeding, or pain. Musculoskeletal: No decreased ROM, myalgias, stiffness, joint swelling, or weakness. Skin: No rash, erythema, lesion changes, pain, warmth, jaundice, or pruritis. Neurological: No headache, dizziness, syncope, seizures, tremors, memory loss, coordination problems, or paresthesias. Psychological: No anxiety, depression, hallucinations, SI/HI. Endocrine: No fatigue, polydipsia, polyphagia, polyuria, or known diabetes. All other systems were reviewed and are otherwise negative.  Past Medical History  Diagnosis Date  . Yeast infection   . Anemia   . H/O fatigue   . Fibroids   . H/O menorrhagia 2001  . Cyst   . Bartholin gland cyst 06/04/2000  . H/O dysmenorrhea  06/04/2000  . Menopausal symptoms   . Diabetes mellitus   . Infertility, female   . Hypertension   . H/O: obesity   . Varicose vein   . Glaucoma   . FH: colon cancer      Past Surgical History  Procedure Laterality Date  . Dilation and curettage of uterus  1989  . Bartholin gland cyst excision  2010  . Cesarean section  [id number]  . Tubal ligation  1996    bilateral    Home Meds:  Prior to Admission medications   Medication Sig Start Date End Date Taking? Authorizing Provider  cyclobenzaprine (FLEXERIL) 5 MG tablet Take 1 tablet (5 mg total) by mouth 2 (two) times daily as needed for muscle spasms. 05/29/12  Yes Elvina Sidle, MD  Estrogens Conjugated (PREMARIN PO) Take by mouth daily.   Yes Historical Provider, MD  Fluticasone-Salmeterol (ADVAIR) 100-50 MCG/DOSE AEPB Inhale 1 puff into the lungs 2 (two) times daily. 12/25/12  Yes Elvina Sidle, MD  glucose blood (ONE TOUCH ULTRA TEST) test strip Use as instructed 05/29/12  Yes Elvina Sidle, MD  Insulin Glargine (LANTUS SOLOSTAR) 100 UNIT/ML SOPN Inject 20 Units into the skin daily. 11/20/12  Yes Elvina Sidle, MD  Insulin Pen Needle 31G X 5 MM MISC lantus pen needles 11/27/12  Yes Chelle S Jeffery, PA-C  JANUMET 50-1000 MG per tablet TAKE 1 TABLET TWICE A DAY WITH MEALS 11/20/12  Yes Eleanore Delia Chimes, PA-C  magnesium 30 MG tablet Take 30 mg by mouth 2 (two) times daily.   Yes Historical Provider, MD  metoprolol succinate (TOPROL-XL) 100 MG 24 hr tablet TAKE 1 TABLET DAILY. TAKE WITH OR IMMEDIATELY FOLLOWING A MEAL. 11/20/12  Yes Eleanore Delia Chimes, PA-C  montelukast (SINGULAIR) 10 MG tablet TAKE 1 TABLET AT BEDTIME 11/20/12  Yes Eleanore E  Debbra Riding, PA-C  Multiple Vitamin (MULTIVITAMIN) tablet Take 1 tablet by mouth daily.   Yes Historical Provider, MD  Ospemifene (OSPHENA) 60 MG TABS Take by mouth daily.   Yes Historical Provider, MD  rOPINIRole (REQUIP) 2 MG tablet Take 0.5 tablets (1 mg total) by mouth at bedtime. 05/29/12  Yes Elvina Sidle, MD  sitaGLIPtan-metformin (JANUMET) 50-1000 MG per tablet Take 1 tablet by mouth 2 (two) times daily with a meal. 05/29/12  Yes Elvina Sidle, MD  Travoprost, BAK Free, (TRAVATAN) 0.004 % SOLN ophthalmic solution 1 drop at bedtime.   Yes Historical Provider, MD  valsartan-hydrochlorothiazide (DIOVAN-HCT) 160-12.5 MG per tablet Take 1 tablet by mouth daily. 11/20/12  Yes Elvina Sidle, MD  zolpidem (AMBIEN) 10 MG tablet Take 1 tablet (10 mg total) by mouth at bedtime as needed for sleep. 11/20/12 12/20/12  Elvina Sidle, MD    Allergies: No Known Allergies  History   Social History  . Marital Status: Married    Spouse Name: N/A    Number of Children: N/A  . Years of Education: N/A   Occupational History  . Not on file.   Social History Main Topics  . Smoking status: Never Smoker   . Smokeless tobacco: Never Used  . Alcohol Use: No  . Drug Use: No  . Sexual Activity: Yes    Partners: Male     Comment: number of sex partners in the last 12 months 1   Other Topics Concern  . Not on file   Social History Narrative   Exercise walk 2-3 times for 30-40 minutes    Family History  Problem Relation Age of Onset  . Cancer Father     colon  . Cancer Mother     per patient stomach  . Diabetes Maternal Grandmother   . Mental illness Sister   . Heart disease Brother     Physical Exam:  140/90 recheck Blood pressure 170/92, pulse 70, temperature 98.6 F (37 C), temperature source Oral, resp. rate 16, height 5\' 4"  (1.626 m), weight 189 lb (85.73 kg), last menstrual period 01/15/2011, SpO2 99.00%., Body mass index is 32.43 kg/(m^2). General: Well developed, well nourished, in no acute distress. HEENT: Normocephalic, atraumatic. Conjunctiva pink, sclera non-icteric. Pupils 2 mm constricting to 1 mm, round, regular, and equally reactive to light and accomodation. EOMI. Internal auditory canal clear. TMs with good cone of light and without pathology. Nasal mucosa pink.  Nares are without discharge. No sinus tenderness. Oral mucosa pink. Dentition good. Pharynx without exudate.   Neck: Supple. Trachea midline. No thyromegaly. Full ROM. No lymphadenopathy. Lungs: Clear to auscultation bilaterally without wheezes, rales, or rhonchi. Breathing is of normal effort and unlabored. Cardiovascular: RRR with S1 S2. No murmurs, rubs, or gallops appreciated. Distal pulses 2+ symmetrically. No carotid or abdominal bruit Abdomen: Soft, non-tender, non-distended with normoactive bowel sounds. No hepatosplenomegaly or masses. No rebound/guarding. No CVA tenderness. Without hernias.  Musculoskeletal: Full range of motion and 5/5 strength throughout. Without swelling, atrophy, tenderness, crepitus, or warmth. Extremities without clubbing, cyanosis, or edema. Calves supple.  Tender right anterior shoulder joint line with full ROM and does have pain with flexion of biceps Skin: Warm and moist without erythema, ecchymosis, wounds, or rash. Neuro: A+Ox3. CN II-XII grossly intact. Moves all extremities spontaneously. Full sensation throughout. Normal gait. DTR 2+ throughout upper and lower extremities. Finger to nose intact. Psych:  Responds to questions appropriately with a normal affect.   Assessment/Plan:  57 y.o. y/o female here for CPE  Hypertension - Plan: POCT glycosylated hemoglobin (Hb A1C), Comprehensive metabolic panel, TSH, Lipid panel, POCT urinalysis dipstick, valsartan-hydrochlorothiazide (DIOVAN-HCT) 160-12.5 MG per tablet, metoprolol succinate (TOPROL-XL) 100 MG 24 hr tablet  Diabetes mellitus - Plan: POCT glycosylated hemoglobin (Hb A1C), Comprehensive metabolic panel, TSH, Lipid panel, POCT urinalysis dipstick, Microalbumin, urine, sitaGLIPtin-metformin (JANUMET) 50-1000 MG per tablet, Insulin Pen Needle 31G X 5 MM MISC  Restless legs - Plan: POCT glycosylated hemoglobin (Hb A1C), Comprehensive metabolic panel, TSH, Lipid panel, POCT urinalysis dipstick, rOPINIRole  (REQUIP) 2 MG tablet  Type 2 diabetes mellitus not at goal - Plan: POCT glycosylated hemoglobin (Hb A1C), Comprehensive metabolic panel, TSH, Lipid panel, POCT urinalysis dipstick, Insulin Glargine (LANTUS SOLOSTAR) 100 UNIT/ML SOPN  Shoulder pain, right - Plan: POCT glycosylated hemoglobin (Hb A1C), Comprehensive metabolic panel, TSH, Lipid panel, POCT urinalysis dipstick, cyclobenzaprine (FLEXERIL) 5 MG tablet, diclofenac (VOLTAREN) 75 MG EC tablet  Environmental allergies - Plan: montelukast (SINGULAIR) 10 MG tablet  Annual physical exam  Signed, Elvina Sidle, MD 02/26/2013 8:41 AM

## 2013-02-27 LAB — MICROALBUMIN, URINE: Microalb, Ur: 0.51 mg/dL (ref 0.00–1.89)

## 2013-03-05 ENCOUNTER — Telehealth: Payer: Self-pay

## 2013-03-05 DIAGNOSIS — R05 Cough: Secondary | ICD-10-CM

## 2013-03-05 MED ORDER — FLUTICASONE-SALMETEROL 100-50 MCG/DOSE IN AEPB: 1.0000 | INHALATION_SPRAY | Freq: Two times a day (BID) | RESPIRATORY_TRACT | Status: DC

## 2013-03-05 NOTE — Telephone Encounter (Signed)
Sent to pharmacy 

## 2013-03-05 NOTE — Telephone Encounter (Signed)
Pended please advise.  

## 2013-03-05 NOTE — Telephone Encounter (Signed)
PATIENT STATES SHE WAS IN THE OFFICE TO SEE DR. Milus Glazier ON OCT. 23, 2014. SHE SAID HE WROTE ALL OF HER PRESCRIPTIONS EXCEPT THE ADVIR INHALER. SHE NEEDS TO HAVE IT SENT TO [facility]. PLEASE CALL HER WHEN IT HAS BEEN DONE.  BEST PHONE [phone] (CELL)   MBC

## 2013-03-06 NOTE — Telephone Encounter (Signed)
Called her to advise.  

## 2013-04-28 ENCOUNTER — Other Ambulatory Visit: Payer: Self-pay | Admitting: Family Medicine

## 2013-04-28 ENCOUNTER — Telehealth: Payer: Self-pay

## 2013-04-28 DIAGNOSIS — G4700 Insomnia, unspecified: Secondary | ICD-10-CM

## 2013-04-28 MED ORDER — ZOLPIDEM TARTRATE 10 MG PO TABS: 10.0000 mg | ORAL_TABLET | Freq: Every evening | ORAL | Status: DC | PRN

## 2013-04-28 NOTE — Telephone Encounter (Signed)
Please advise 

## 2013-04-28 NOTE — Telephone Encounter (Signed)
PT STATES [facility] TOLD HER TO CALL AND REQUEST A REFILL ON HER AMBIEN WITH A 90 DAY SUPPLY THE FAX NUMBER IS [phone] AND YOU MAY REACH PT AT [phone] IF NEEDED

## 2013-05-21 ENCOUNTER — Other Ambulatory Visit: Payer: Self-pay | Admitting: Physician Assistant

## 2013-05-26 ENCOUNTER — Other Ambulatory Visit: Payer: Self-pay

## 2013-05-26 MED ORDER — LANCETS MISC: Status: DC

## 2013-05-28 ENCOUNTER — Ambulatory Visit: Payer: Managed Care, Other (non HMO) | Admitting: Family Medicine

## 2013-05-29 ENCOUNTER — Telehealth: Payer: Self-pay

## 2013-05-29 NOTE — Telephone Encounter (Signed)
[facility] sent requests for Rxs for 3 different compounded topical creams. I denied one for itching and one for scarring d/t [place] not having eval pt for these conditions. I put the form for a pain cream in Dr Lenn Cal box for review since pt was seen for shoulder pain.

## 2013-06-04 NOTE — Telephone Encounter (Signed)
Req also faxed for wound cream and migraine pain cream. I denied both. We have not evaluated pt for these.

## 2013-07-09 ENCOUNTER — Ambulatory Visit (INDEPENDENT_AMBULATORY_CARE_PROVIDER_SITE_OTHER): Payer: Managed Care, Other (non HMO) | Admitting: Family Medicine

## 2013-07-09 ENCOUNTER — Encounter: Payer: Self-pay | Admitting: Family Medicine

## 2013-07-09 VITALS — BP 167/84 | HR 81 | Temp 98.0°F | Resp 16 | Ht 64.5 in | Wt 190.0 lb

## 2013-07-09 DIAGNOSIS — R252 Cramp and spasm: Secondary | ICD-10-CM

## 2013-07-09 DIAGNOSIS — E119 Type 2 diabetes mellitus without complications: Secondary | ICD-10-CM

## 2013-07-09 LAB — COMPLETE METABOLIC PANEL WITH GFR
ALT: 13 U/L (ref 0–35)
AST: 11 U/L (ref 0–37)
Albumin: 4 g/dL (ref 3.5–5.2)
Alkaline Phosphatase: 68 U/L (ref 39–117)
BUN: 9 mg/dL (ref 6–23)
CO2: 22 mEq/L (ref 19–32)
Calcium: 9.4 mg/dL (ref 8.4–10.5)
Chloride: 104 mEq/L (ref 96–112)
Creat: 0.77 mg/dL (ref 0.50–1.10)
GFR, Est African American: >89 mL/min
GFR, Est Non African American: 87 mL/min
Glucose, Bld: 167 mg/dL — ABNORMAL HIGH (ref 70–99)
Potassium: 3.8 mEq/L (ref 3.5–5.3)
Sodium: 137 mEq/L (ref 135–145)
Total Bilirubin: 0.6 mg/dL (ref 0.2–1.2)
Total Protein: 6.9 g/dL (ref 6.0–8.3)

## 2013-07-09 LAB — MAGNESIUM: Magnesium: 1.5 mg/dL (ref 1.5–2.5)

## 2013-07-09 LAB — POCT GLYCOSYLATED HEMOGLOBIN (HGB A1C): Hemoglobin A1C: 9.8

## 2013-07-09 NOTE — Progress Notes (Signed)
Subjective:  This chart was scribed for [name], MD by Donato Schultz, Medical Scribe. This patient was seen in Room 27 and the patient's care was started at 8:41 AM.   Patient ID: Heidi Howell, female    DOB: [birth year], 58 y.o.   MRN: [id number]  HPI HPI Comments: Heidi Howell is a 58 y.o. female with a history of DM who presents to the [facility] needing a recheck of her DM.  The patient states that she will experience intermittent left leg and back pain.  She states that she has been experiencing a lot of muscle cramps in her left leg.  She denies numbness in her feet as an associated symptom.  She states that she has not been walking as much lately due to her work schedule.  She states that her weight has not been doing well.  The patient states that she gets her eyes checked by Dr. Idolina Primer.  The patient states that she has been working for 27 years and only works days.  The patient states that she needs a refill on her glucose blood test strips and Ambien.   Past Medical History  Diagnosis Date   Yeast infection    Anemia    H/O fatigue    Fibroids    H/O menorrhagia 2001   Cyst    Bartholin gland cyst 06/04/2000   H/O dysmenorrhea 06/04/2000   Menopausal symptoms    Diabetes mellitus    Infertility, female    Hypertension    H/O: obesity    Varicose vein    Glaucoma    FH: colon cancer    Past Surgical History  Procedure Laterality Date   Dilation and curettage of uterus  1989   Bartholin gland cyst excision  2010   Cesarean section  [id number]   Tubal ligation  1996    bilateral   Family History  Problem Relation Age of Onset   Cancer Father     colon   Cancer Mother     per patient stomach   Diabetes Maternal Grandmother    Mental illness Sister    Heart disease Brother    History   Social History   Marital Status: Married    Spouse Name: N/A    Number of Children: N/A   Years of Education: N/A    Occupational History   Not on file.   Social History Main Topics   Smoking status: Never Smoker    Smokeless tobacco: Never Used   Alcohol Use: No   Drug Use: No   Sexual Activity: Yes    Partners: Male     Comment: number of sex partners in the last 27 months 1   Other Topics Concern   Not on file   Social History Narrative   Exercise walk 2-3 times for 30-40 minutes   No Known Allergies  Review of Systems  Musculoskeletal: Positive for arthralgias (bilateral legs) and back pain.  Neurological: Negative for numbness.  All other systems reviewed and are negative.     Objective:  Physical Exam  Nursing note and vitals reviewed. Constitutional: She is oriented to person, place, and time. She appears well-developed and well-nourished.  HENT:  Head: Normocephalic and atraumatic.  Eyes: Conjunctivae and EOM are normal. Pupils are equal, round, and reactive to light. Right eye exhibits no discharge. Left eye exhibits no discharge. No scleral icterus.  Neck: Normal range of motion.  Cardiovascular: Normal rate, regular rhythm  and normal heart sounds.  Exam reveals no gallop and no friction rub.   No murmur heard. Pulmonary/Chest: Effort normal.  Abdominal: Soft. She exhibits no distension and no mass. There is no tenderness. There is no rebound and no guarding.  Musculoskeletal: Normal range of motion.  Neurological: She is alert and oriented to person, place, and time.  Skin: Skin is warm and dry.  Psychiatric: She has a normal mood and affect. Her behavior is normal.   I spent much of the time with patient reviewing the importance of diet and exercise. She did not come in with her husband this time but indicates that she'll be working harder at it. She's been working for 27 years and needs 30 years for retirement.  Results for orders placed in visit on 07/09/13  POCT GLYCOSYLATED HEMOGLOBIN (HGB A1C)      Result Value Ref Range   Hemoglobin A1C 9.8       BP  167/84   Pulse 81   Temp(Src) 98 F (36.7 C)   Resp 16   Ht 5' 4.5" (1.638 m)   Wt 190 lb (86.183 kg)   BMI 32.12 kg/m2   SpO2 99%   LMP 01/15/2011 Assessment & Plan:    I personally performed the services described in this documentation, which was scribed in my presence. The recorded information has been reviewed and is accurate.  Diabetes - Plan: HM Diabetes Foot Exam, COMPLETE METABOLIC PANEL WITH GFR, POCT glycosylated hemoglobin (Hb A1C), Magnesium  Muscle cramps Since the hemoglobin A1c has not responded to current regimen, I will be referring patient to an endocrinologist Signed, [name], MD

## 2013-07-14 ENCOUNTER — Ambulatory Visit (INDEPENDENT_AMBULATORY_CARE_PROVIDER_SITE_OTHER): Payer: 59 | Admitting: Endocrinology

## 2013-07-14 ENCOUNTER — Telehealth: Payer: Self-pay

## 2013-07-14 ENCOUNTER — Encounter: Payer: Self-pay | Admitting: *Deleted

## 2013-07-14 ENCOUNTER — Other Ambulatory Visit: Payer: Self-pay | Admitting: Family Medicine

## 2013-07-14 ENCOUNTER — Encounter: Payer: Self-pay | Admitting: Endocrinology

## 2013-07-14 VITALS — BP 140/98 | HR 79 | Temp 98.9°F | Ht 64.5 in | Wt 191.0 lb

## 2013-07-14 DIAGNOSIS — E109 Type 1 diabetes mellitus without complications: Secondary | ICD-10-CM

## 2013-07-14 NOTE — Telephone Encounter (Signed)
Pt called about labs. Advised  

## 2013-07-14 NOTE — Progress Notes (Signed)
Subjective:    Patient ID: Heidi Howell, female    DOB: 20-Feb-1956, 58 y.o.   MRN: [id number]  HPI pt states DM was dx'ed in 2007; she has mild if any neuropathy of the lower extremities; she is unaware of any associated chronic complications.  she has been on insulin since 2014.  pt says her diet and exercise are not very good.  She says cbg's vary from 140-200's.  It is in general higher as the day goes on.   Past Medical History  Diagnosis Date  . Yeast infection   . Anemia   . H/O fatigue   . Fibroids   . H/O menorrhagia 2001  . Cyst   . Bartholin gland cyst 06/04/2000  . H/O dysmenorrhea 06/04/2000  . Menopausal symptoms   . Diabetes mellitus   . Infertility, female   . Hypertension   . H/O: obesity   . Varicose vein   . Glaucoma   . FH: colon cancer     Past Surgical History  Procedure Laterality Date  . Dilation and curettage of uterus  1989  . Bartholin gland cyst excision  2010  . Cesarean section  [id number]  . Tubal ligation  1996    bilateral    History   Social History  . Marital Status: Married    Spouse Name: N/A    Number of Children: N/A  . Years of Education: N/A   Occupational History  . Not on file.   Social History Main Topics  . Smoking status: Never Smoker   . Smokeless tobacco: Never Used  . Alcohol Use: No  . Drug Use: No  . Sexual Activity: Yes    Partners: Male     Comment: number of sex partners in the last 8 months 1   Other Topics Concern  . Not on file   Social History Narrative   Exercise walk 2-3 times for 30-40 minutes    Current Outpatient Prescriptions on File Prior to Visit  Medication Sig Dispense Refill  . cyclobenzaprine (FLEXERIL) 5 MG tablet Take 1 tablet (5 mg total) by mouth 2 (two) times daily as needed for muscle spasms.  90 tablet  1  . Estrogens Conjugated (PREMARIN PO) Take by mouth daily.      . Fluticasone-Salmeterol (ADVAIR) 100-50 MCG/DOSE AEPB Inhale 1 puff into the lungs 2 (two) times daily.  1  each  3  . glucose blood (ONE TOUCH ULTRA TEST) test strip 1 each by Other route as needed for other. Use as instructed      . Insulin Glargine (LANTUS SOLOSTAR) 100 UNIT/ML SOPN Inject 20 Units into the skin daily.  5 pen  PRN  . Insulin Pen Needle 31G X 5 MM MISC lantus pen needles  100 each  2  . Lancets MISC Test blood sugar 3 times a day.  100 each  8  . metoprolol succinate (TOPROL-XL) 100 MG 24 hr tablet Take 1 tablet (100 mg total) by mouth daily. Take with or immediately following a meal.  90 tablet  3  . montelukast (SINGULAIR) 10 MG tablet Take 1 tablet (10 mg total) by mouth at bedtime.  90 tablet  3  . Multiple Vitamin (MULTIVITAMIN) tablet Take 1 tablet by mouth daily.      . Ospemifene (OSPHENA) 60 MG TABS Take by mouth daily.      Marland Kitchen rOPINIRole (REQUIP) 2 MG tablet Take 0.5 tablets (1 mg total) by mouth at bedtime.  30 tablet  5  . sitaGLIPtin-metformin (JANUMET) 50-1000 MG per tablet Take 1 tablet by mouth 2 (two) times daily with a meal.  180 tablet  3  . Travoprost, BAK Free, (TRAVATAN) 0.004 % SOLN ophthalmic solution 1 drop at bedtime.      . valsartan-hydrochlorothiazide (DIOVAN-HCT) 160-12.5 MG per tablet Take 1 tablet by mouth daily.  90 tablet  1  . zolpidem (AMBIEN) 10 MG tablet Take 1 tablet (10 mg total) by mouth at bedtime as needed for sleep.  90 tablet  1   No current facility-administered medications on file prior to visit.    No Known Allergies  Family History  Problem Relation Age of Onset  . Cancer Father     colon  . Cancer Mother     per patient stomach  . Diabetes Maternal Grandmother   . Mental illness Sister   . Heart disease Brother   DM: 1 sib and 2 grandparents  BP 140/98  Pulse 79  Temp(Src) 98.9 F (37.2 C) (Oral)  Ht 5' 4.5" (1.638 m)  Wt 191 lb (86.637 kg)  BMI 32.29 kg/m2  SpO2 99%  LMP 01/15/2011  Review of Systems denies blurry vision, headache, chest pain, sob, n/v, urinary frequency, memory loss, depression, cold  intolerance, rhinorrhea, and easy bruising. She has weight gain, night sweats, and leg cramps.      Objective:   Physical Exam VS: see vs page GEN: no distress HEAD: head: no deformity eyes: no periorbital swelling, no proptosis external nose and ears are normal mouth: no lesion seen NECK: supple, thyroid is not enlarged CHEST WALL: no deformity LUNGS:  Clear to auscultation CV: reg rate and rhythm, no murmur ABD: abdomen is soft, nontender.  no hepatosplenomegaly.  not distended.  no hernia MUSCULOSKELETAL: muscle bulk and strength are grossly normal.  no obvious joint swelling.  gait is normal and steady PULSES: no carotid bruit NEURO:  cn 2-12 grossly intact.   readily moves all 4's.  SKIN:  Normal texture and temperature.  No rash or suspicious lesion is visible.   NODES:  None palpable at the neck PSYCH: alert, well-oriented.  Does not appear anxious nor depressed.  Lab Results  Component Value Date   HGBA1C 9.8 07/09/2013      Assessment & Plan:  DM: she needs increased rx.  This a1c causes high risk to her health.  I recommended multiple daily insulin injections, but she says she wishes to continue with QD insulin for now.   Obesity: this increases the risks of DM. HTN: she needs f/u for this.  It may have a situational component, or she may need increased rx.

## 2013-07-14 NOTE — Patient Instructions (Addendum)
good diet and exercise habits significanly improve the control of your diabetes.  please let me know if you wish to be referred to a dietician.  high blood sugar is very risky to your health.  you should see an eye doctor every year.  You are at higher than average risk for pneumonia and hepatitis-B.  You should be vaccinated against both.   controlling your blood pressure and cholesterol drastically reduces the damage diabetes does to your body.  this also applies to quitting smoking.  please discuss these with your doctor.  check your blood sugar twice a day.  vary the time of day when you check, between before the 3 meals, and at bedtime.  also check if you have symptoms of your blood sugar being too high or too low.  please keep a record of the readings and bring it to your next appointment here.  You can write it on any piece of paper.  please call us sooner if your blood sugar goes below 70, or if you have a lot of readings over 200. For now, please increase the lantus to 30 units each morning, and: Stop taking the janumet.

## 2013-07-28 ENCOUNTER — Ambulatory Visit (INDEPENDENT_AMBULATORY_CARE_PROVIDER_SITE_OTHER): Payer: 59 | Admitting: Endocrinology

## 2013-07-28 ENCOUNTER — Encounter: Payer: Self-pay | Admitting: Endocrinology

## 2013-07-28 VITALS — BP 132/100 | HR 92 | Temp 98.3°F | Ht 64.5 in | Wt 191.0 lb

## 2013-07-28 DIAGNOSIS — E109 Type 1 diabetes mellitus without complications: Secondary | ICD-10-CM

## 2013-07-28 NOTE — Patient Instructions (Addendum)
check your blood sugar twice a day.  vary the time of day when you check, between before the 3 meals, and at bedtime.  also check if you have symptoms of your blood sugar being too high or too low.  please keep a record of the readings and bring it to your next appointment here.  You can write it on any piece of paper.  please call us sooner if your blood sugar goes below 70, or if you have a lot of readings over 200. please increase the lantus to 40 units each morning.  Please go back soon to see Dr Joseph Art, about your blood pressure.   Please come back for a follow-up appointment in 1 month.

## 2013-07-28 NOTE — Progress Notes (Signed)
Subjective:    Patient ID: Heidi Howell, female    DOB: November 07, 1955, 58 y.o.   MRN: [id number]  HPI pt ewturns for f/u of insulin-requiring DM (dx'ed 2007; she has mild if any neuropathy of the lower extremities; she is unaware of any associated chronic complications; she has been on insulin since 2014; she was turned down for weight-loss surgery; she chose a qd insulin schedule).  no cbg record, but states cbg's vary from 140-250.  There is no trend throughout the day. Past Medical History  Diagnosis Date  . Yeast infection   . Anemia   . H/O fatigue   . Fibroids   . H/O menorrhagia 2001  . Cyst   . Bartholin gland cyst 06/04/2000  . H/O dysmenorrhea 06/04/2000  . Menopausal symptoms   . Diabetes mellitus   . Infertility, female   . Hypertension   . H/O: obesity   . Varicose vein   . Glaucoma   . FH: colon cancer     Past Surgical History  Procedure Laterality Date  . Dilation and curettage of uterus  1989  . Bartholin gland cyst excision  2010  . Cesarean section  [id number]  . Tubal ligation  1996    bilateral    History   Social History  . Marital Status: Married    Spouse Name: N/A    Number of Children: N/A  . Years of Education: N/A   Occupational History  . Not on file.   Social History Main Topics  . Smoking status: Never Smoker   . Smokeless tobacco: Never Used  . Alcohol Use: No  . Drug Use: No  . Sexual Activity: Yes    Partners: Male     Comment: number of sex partners in the last 30 months 1   Other Topics Concern  . Not on file   Social History Narrative   Exercise walk 2-3 times for 30-40 minutes    Current Outpatient Prescriptions on File Prior to Visit  Medication Sig Dispense Refill  . cyclobenzaprine (FLEXERIL) 5 MG tablet Take 1 tablet (5 mg total) by mouth 2 (two) times daily as needed for muscle spasms.  90 tablet  1  . Estrogens Conjugated (PREMARIN PO) Take by mouth daily.      . Fluticasone-Salmeterol (ADVAIR) 100-50 MCG/DOSE  AEPB Inhale 1 puff into the lungs 2 (two) times daily.  1 each  3  . glucose blood (ONE TOUCH ULTRA TEST) test strip 1 each by Other route as needed for other. Use as instructed      . Insulin Glargine (LANTUS) 100 UNIT/ML Solostar Pen Inject 40 Units into the skin every morning.       . Insulin Pen Needle 31G X 5 MM MISC lantus pen needles  100 each  2  . Lancets MISC Test blood sugar 3 times a day.  100 each  8  . metoprolol succinate (TOPROL-XL) 100 MG 24 hr tablet Take 1 tablet (100 mg total) by mouth daily. Take with or immediately following a meal.  90 tablet  3  . montelukast (SINGULAIR) 10 MG tablet Take 1 tablet (10 mg total) by mouth at bedtime.  90 tablet  3  . Multiple Vitamin (MULTIVITAMIN) tablet Take 1 tablet by mouth daily.      . ONE TOUCH ULTRA TEST test strip USE AS DIRECTED  300 each  3  . Ospemifene (OSPHENA) 60 MG TABS Take by mouth daily.      Marland Kitchen  rOPINIRole (REQUIP) 2 MG tablet Take 0.5 tablets (1 mg total) by mouth at bedtime.  30 tablet  5  . Travoprost, BAK Free, (TRAVATAN) 0.004 % SOLN ophthalmic solution 1 drop at bedtime.      . valsartan-hydrochlorothiazide (DIOVAN-HCT) 160-12.5 MG per tablet Take 1 tablet by mouth daily.  90 tablet  1  . zolpidem (AMBIEN) 10 MG tablet Take 1 tablet (10 mg total) by mouth at bedtime as needed for sleep.  90 tablet  1   No current facility-administered medications on file prior to visit.    No Known Allergies  Family History  Problem Relation Age of Onset  . Cancer Father     colon  . Cancer Mother     per patient stomach  . Diabetes Maternal Grandmother   . Mental illness Sister   . Heart disease Brother     BP 132/100  Pulse 92  Temp(Src) 98.3 F (36.8 C) (Oral)  Ht 5' 4.5" (1.638 m)  Wt 191 lb (86.637 kg)  BMI 32.29 kg/m2  SpO2 97%  LMP 01/15/2011  Review of Systems She denies hypoglycemia and weight change.     Objective:   Physical Exam VITAL SIGNS:  See vs page GENERAL: no distress SKIN:  Insulin  injection sites at the triceps areas are normal.     Lab Results  Component Value Date   HGBA1C 9.8 07/09/2013      Assessment & Plan:  DM: she needs increased rx Obesity: this complicates the rx of DM. HTN: she needs increased rx

## 2013-08-14 ENCOUNTER — Other Ambulatory Visit: Payer: Self-pay | Admitting: Family Medicine

## 2013-08-16 ENCOUNTER — Other Ambulatory Visit: Payer: Self-pay | Admitting: Family Medicine

## 2013-08-17 ENCOUNTER — Encounter: Payer: Self-pay | Admitting: *Deleted

## 2013-08-17 DIAGNOSIS — H524 Presbyopia: Secondary | ICD-10-CM | POA: Insufficient documentation

## 2013-08-17 DIAGNOSIS — Z0389 Encounter for observation for other suspected diseases and conditions ruled out: Principal | ICD-10-CM

## 2013-08-17 DIAGNOSIS — H52209 Unspecified astigmatism, unspecified eye: Secondary | ICD-10-CM | POA: Insufficient documentation

## 2013-08-17 DIAGNOSIS — H269 Unspecified cataract: Secondary | ICD-10-CM | POA: Insufficient documentation

## 2013-08-17 DIAGNOSIS — H5200 Hypermetropia, unspecified eye: Secondary | ICD-10-CM | POA: Insufficient documentation

## 2013-08-17 DIAGNOSIS — H401190 Primary open-angle glaucoma, unspecified eye, stage unspecified: Secondary | ICD-10-CM | POA: Insufficient documentation

## 2013-08-17 DIAGNOSIS — IMO0001 Reserved for inherently not codable concepts without codable children: Secondary | ICD-10-CM | POA: Insufficient documentation

## 2013-08-28 ENCOUNTER — Encounter: Payer: Self-pay | Admitting: Endocrinology

## 2013-08-28 ENCOUNTER — Ambulatory Visit: Payer: 59 | Admitting: Endocrinology

## 2013-08-28 ENCOUNTER — Ambulatory Visit (INDEPENDENT_AMBULATORY_CARE_PROVIDER_SITE_OTHER): Payer: 59 | Admitting: Endocrinology

## 2013-08-28 VITALS — BP 136/80 | HR 83 | Temp 99.1°F | Ht 64.5 in | Wt 190.0 lb

## 2013-08-28 DIAGNOSIS — E109 Type 1 diabetes mellitus without complications: Secondary | ICD-10-CM

## 2013-08-28 NOTE — Progress Notes (Signed)
Subjective:    Patient ID: Heidi Howell, female    DOB: 1956-04-16, 58 y.o.   MRN: [id number]  HPI pt returns for f/u of insulin-requiring DM (dx'ed 2007; she has mild if any neuropathy of the lower extremities; she is unaware of any associated chronic complications; she has been on insulin since 2014; she was turned down for weight-loss surgery; she chose a qd insulin schedule).  no cbg record, but states cbg's are mostly in the 200's.  It is sometimes as low as 160.  It is in general higher as the day goes on.   Past Medical History  Diagnosis Date  . Yeast infection   . Anemia   . H/O fatigue   . Fibroids   . H/O menorrhagia 2001  . Cyst   . Bartholin gland cyst 06/04/2000  . H/O dysmenorrhea 06/04/2000  . Menopausal symptoms   . Diabetes mellitus   . Infertility, female   . Hypertension   . H/O: obesity   . Varicose vein   . Glaucoma   . FH: colon cancer     Past Surgical History  Procedure Laterality Date  . Dilation and curettage of uterus  1989  . Bartholin gland cyst excision  2010  . Cesarean section  [id number]  . Tubal ligation  1996    bilateral    History   Social History  . Marital Status: Married    Spouse Name: N/A    Number of Children: N/A  . Years of Education: N/A   Occupational History  . Not on file.   Social History Main Topics  . Smoking status: Never Smoker   . Smokeless tobacco: Never Used  . Alcohol Use: No  . Drug Use: No  . Sexual Activity: Yes    Partners: Male     Comment: number of sex partners in the last 74 months 1   Other Topics Concern  . Not on file   Social History Narrative   Exercise walk 2-3 times for 30-40 minutes    Current Outpatient Prescriptions on File Prior to Visit  Medication Sig Dispense Refill  . cyclobenzaprine (FLEXERIL) 5 MG tablet TAKE 1 TABLET TWICE A DAY AS NEEDED FOR MUSCLE SPASMS  90 tablet  0  . Estrogens Conjugated (PREMARIN PO) Take by mouth daily.      . Fluticasone-Salmeterol (ADVAIR)  100-50 MCG/DOSE AEPB Inhale 1 puff into the lungs 2 (two) times daily.  1 each  3  . glucose blood (ONE TOUCH ULTRA TEST) test strip 1 each by Other route as needed for other. Use as instructed      . Insulin Glargine (LANTUS) 100 UNIT/ML Solostar Pen Inject 50 Units into the skin every morning.       . Insulin Pen Needle 31G X 5 MM MISC lantus pen needles  100 each  2  . Lancets MISC Test blood sugar 3 times a day.  100 each  8  . metoprolol succinate (TOPROL-XL) 100 MG 24 hr tablet Take 1 tablet (100 mg total) by mouth daily. Take with or immediately following a meal.  90 tablet  3  . montelukast (SINGULAIR) 10 MG tablet Take 1 tablet (10 mg total) by mouth at bedtime.  90 tablet  3  . Multiple Vitamin (MULTIVITAMIN) tablet Take 1 tablet by mouth daily.      . ONE TOUCH ULTRA TEST test strip USE AS DIRECTED  300 each  3  . Ospemifene (OSPHENA) 60 MG TABS Take  by mouth daily.      Marland Kitchen rOPINIRole (REQUIP) 2 MG tablet Take 0.5 tablets (1 mg total) by mouth at bedtime.  30 tablet  5  . Travoprost, BAK Free, (TRAVATAN) 0.004 % SOLN ophthalmic solution 1 drop at bedtime.      . valsartan-hydrochlorothiazide (DIOVAN-HCT) 160-12.5 MG per tablet TAKE 1 TABLET DAILY  90 tablet  0  . zolpidem (AMBIEN) 10 MG tablet Take 1 tablet (10 mg total) by mouth at bedtime as needed for sleep.  90 tablet  1   No current facility-administered medications on file prior to visit.    No Known Allergies  Family History  Problem Relation Age of Onset  . Cancer Father     colon  . Cancer Mother     per patient stomach  . Diabetes Maternal Grandmother   . Mental illness Sister   . Heart disease Brother     BP 136/80  Pulse 83  Temp(Src) 99.1 F (37.3 C) (Oral)  Ht 5' 4.5" (1.638 m)  Wt 190 lb (86.183 kg)  BMI 32.12 kg/m2  SpO2 94%  LMP 01/15/2011  Review of Systems She denies hypoglycemia.  She has chronic weight gain.      Objective:   Physical Exam VITAL SIGNS:  See vs page GENERAL: no  distress      Assessment & Plan:  DM: she needs increased rx Obesity: this complicates the rx of DM.

## 2013-08-28 NOTE — Patient Instructions (Addendum)
check your blood sugar twice a day.  vary the time of day when you check, between before the 3 meals, and at bedtime.  also check if you have symptoms of your blood sugar being too high or too low.  please keep a record of the readings and bring it to your next appointment here.  You can write it on any piece of paper.  please call us sooner if your blood sugar goes below 70, or if you have a lot of readings over 200. please increase the lantus to 50 units each morning.   Please come back for a follow-up appointment in 1 month.   Refer to a weight-loss surgery specialist.  you will receive a phone call, about a day and time for an informational meeting.

## 2013-09-04 ENCOUNTER — Encounter: Payer: Self-pay | Admitting: Family Medicine

## 2013-09-21 ENCOUNTER — Telehealth: Payer: Self-pay

## 2013-09-21 NOTE — Telephone Encounter (Signed)
PT STATES SHE WAS REFERRED TO ANOTHER DR BY DR Synetta Shadow AND THEY INCREASED HER INSULIN, SHE IS NOW OUT AND NEED ANOTHER NEW SCRIPT , DIDN'T KNOW IF SHE SHOULD BE CALLING us OR THE OTHER DR BUT DR KURT IS HER PCP. PLEASE CALL [phone]    [facility]

## 2013-09-22 ENCOUNTER — Telehealth: Payer: Self-pay | Admitting: Endocrinology

## 2013-09-22 MED ORDER — INSULIN GLARGINE 100 UNIT/ML SOLOSTAR PEN: 50.0000 [IU] | PEN_INJECTOR | SUBCUTANEOUS | Status: DC

## 2013-09-22 NOTE — Telephone Encounter (Signed)
Rx sent to pharmacy   

## 2013-09-22 NOTE — Telephone Encounter (Signed)
PT CALLED AGAIN AND REALLY NEED SOMEONE TO CALL HER BACK. [name] [phone]

## 2013-09-22 NOTE — Telephone Encounter (Signed)
Called pt back, LMOM and gave her Dr Pauletta Browns message that Dr Loanne Drilling, ENDO, should be managing her DM meds and to please contact them for RF and suggested some locally to hold her until [facility] delivery.

## 2013-09-22 NOTE — Telephone Encounter (Signed)
Please call in lantus rx for pt asap she has two days left

## 2013-09-29 ENCOUNTER — Encounter: Payer: Self-pay | Admitting: Endocrinology

## 2013-09-29 ENCOUNTER — Ambulatory Visit (INDEPENDENT_AMBULATORY_CARE_PROVIDER_SITE_OTHER): Payer: 59 | Admitting: Endocrinology

## 2013-09-29 VITALS — BP 130/90 | HR 89 | Temp 98.5°F | Ht 64.5 in | Wt 193.0 lb

## 2013-09-29 DIAGNOSIS — E109 Type 1 diabetes mellitus without complications: Secondary | ICD-10-CM

## 2013-09-29 NOTE — Progress Notes (Signed)
Subjective:    Patient ID: Heidi Howell, female    DOB: Feb 04, 1956, 58 y.o.   MRN: [id number]  HPI pt returns for f/u of insulin-requiring DM (dx'ed 2007, on a routine blood test; she has mild if any neuropathy of the lower extremities; she is unaware of any associated chronic complications; she has been on insulin since 2014; she was turned down for weight-loss surgery; she chose a qd insulin schedule; she has never had pancreatitis, severe hypoglycemia, or DKA).  no cbg record, but states cbg's vary from 130-300.  There is no trend throughout the day.   Past Medical History  Diagnosis Date  . Yeast infection   . Anemia   . H/O fatigue   . Fibroids   . H/O menorrhagia 2001  . Cyst   . Bartholin gland cyst 06/04/2000  . H/O dysmenorrhea 06/04/2000  . Menopausal symptoms   . Diabetes mellitus   . Infertility, female   . Hypertension   . H/O: obesity   . Varicose vein   . Glaucoma   . FH: colon cancer     Past Surgical History  Procedure Laterality Date  . Dilation and curettage of uterus  1989  . Bartholin gland cyst excision  2010  . Cesarean section  [id number]  . Tubal ligation  1996    bilateral    History   Social History  . Marital Status: Married    Spouse Name: N/A    Number of Children: N/A  . Years of Education: N/A   Occupational History  . Not on file.   Social History Main Topics  . Smoking status: Never Smoker   . Smokeless tobacco: Never Used  . Alcohol Use: No  . Drug Use: No  . Sexual Activity: Yes    Partners: Male     Comment: number of sex partners in the last 63 months 1   Other Topics Concern  . Not on file   Social History Narrative   Exercise walk 2-3 times for 30-40 minutes    Current Outpatient Prescriptions on File Prior to Visit  Medication Sig Dispense Refill  . cyclobenzaprine (FLEXERIL) 5 MG tablet TAKE 1 TABLET TWICE A DAY AS NEEDED FOR MUSCLE SPASMS  90 tablet  0  . Estrogens Conjugated (PREMARIN PO) Take by mouth  daily.      . Fluticasone-Salmeterol (ADVAIR) 100-50 MCG/DOSE AEPB Inhale 1 puff into the lungs 2 (two) times daily.  1 each  3  . glucose blood (ONE TOUCH ULTRA TEST) test strip 1 each by Other route as needed for other. Use as instructed      . Insulin Pen Needle 31G X 5 MM MISC lantus pen needles  100 each  2  . Lancets MISC Test blood sugar 3 times a day.  100 each  8  . metoprolol succinate (TOPROL-XL) 100 MG 24 hr tablet Take 1 tablet (100 mg total) by mouth daily. Take with or immediately following a meal.  90 tablet  3  . montelukast (SINGULAIR) 10 MG tablet Take 1 tablet (10 mg total) by mouth at bedtime.  90 tablet  3  . Multiple Vitamin (MULTIVITAMIN) tablet Take 1 tablet by mouth daily.      . ONE TOUCH ULTRA TEST test strip USE AS DIRECTED  300 each  3  . Ospemifene (OSPHENA) 60 MG TABS Take by mouth daily.      Marland Kitchen rOPINIRole (REQUIP) 2 MG tablet Take 0.5 tablets (1 mg total) by  mouth at bedtime.  30 tablet  5  . Travoprost, BAK Free, (TRAVATAN) 0.004 % SOLN ophthalmic solution 1 drop at bedtime.      . valsartan-hydrochlorothiazide (DIOVAN-HCT) 160-12.5 MG per tablet TAKE 1 TABLET DAILY  90 tablet  0  . zolpidem (AMBIEN) 10 MG tablet Take 1 tablet (10 mg total) by mouth at bedtime as needed for sleep.  90 tablet  1   No current facility-administered medications on file prior to visit.    No Known Allergies  Family History  Problem Relation Age of Onset  . Cancer Father     colon  . Cancer Mother     per patient stomach  . Diabetes Maternal Grandmother   . Mental illness Sister   . Heart disease Brother     BP 130/90  Pulse 89  Temp(Src) 98.5 F (36.9 C) (Oral)  Ht 5' 4.5" (1.638 m)  Wt 193 lb (87.544 kg)  BMI 32.63 kg/m2  SpO2 98%  LMP 01/15/2011  Review of Systems She denies hypoglycemia and weight change    Objective:   Physical Exam VITAL SIGNS:  See vs page GENERAL: no distress SKIN:  Insulin injection sites at the triceps areas are normal.  Lab  Results  Component Value Date   HGBA1C 9.8 07/09/2013      Assessment & Plan:  DM: moderate exacerbation. Obesity: This impairs the ability to achieve glycemic control.  I'll work around this as best I can   Patient Instructions  check your blood sugar twice a day.  vary the time of day when you check, between before the 3 meals, and at bedtime.  also check if you have symptoms of your blood sugar being too high or too low.  please keep a record of the readings and bring it to your next appointment here.  You can write it on any piece of paper.  please call us sooner if your blood sugar goes below 70, or if you have a lot of readings over 200.   please increase the lantus to 60 units each morning.   Please come back for a follow-up appointment in 6 weeks.

## 2013-09-29 NOTE — Patient Instructions (Addendum)
check your blood sugar twice a day.  vary the time of day when you check, between before the 3 meals, and at bedtime.  also check if you have symptoms of your blood sugar being too high or too low.  please keep a record of the readings and bring it to your next appointment here.  You can write it on any piece of paper.  please call us sooner if your blood sugar goes below 70, or if you have a lot of readings over 200.   please increase the lantus to 60 units each morning.   Please come back for a follow-up appointment in 6 weeks.

## 2013-10-12 ENCOUNTER — Ambulatory Visit
Admission: RE | Admit: 2013-10-12 | Discharge: 2013-10-12 | Disposition: A | Discharge status: Home / Self Care | Payer: 59 | Source: Ambulatory Visit | Attending: Obstetrics and Gynecology | Admitting: Obstetrics and Gynecology

## 2013-10-12 ENCOUNTER — Other Ambulatory Visit: Payer: Self-pay | Admitting: Obstetrics and Gynecology

## 2013-10-12 DIAGNOSIS — Z1231 Encounter for screening mammogram for malignant neoplasm of breast: Secondary | ICD-10-CM

## 2013-10-15 ENCOUNTER — Encounter: Payer: Self-pay | Admitting: Family Medicine

## 2013-10-15 ENCOUNTER — Ambulatory Visit (INDEPENDENT_AMBULATORY_CARE_PROVIDER_SITE_OTHER): Payer: Managed Care, Other (non HMO) | Admitting: Family Medicine

## 2013-10-15 VITALS — BP 160/90 | HR 67 | Temp 98.7°F | Resp 16 | Ht 64.0 in | Wt 195.0 lb

## 2013-10-15 DIAGNOSIS — I10 Essential (primary) hypertension: Secondary | ICD-10-CM

## 2013-10-15 DIAGNOSIS — R252 Cramp and spasm: Secondary | ICD-10-CM

## 2013-10-15 DIAGNOSIS — E119 Type 2 diabetes mellitus without complications: Secondary | ICD-10-CM

## 2013-10-15 LAB — COMPREHENSIVE METABOLIC PANEL
ALT: 39 U/L — ABNORMAL HIGH (ref 0–35)
AST: 31 U/L (ref 0–37)
Albumin: 4.2 g/dL (ref 3.5–5.2)
Alkaline Phosphatase: 84 U/L (ref 39–117)
BUN: 9 mg/dL (ref 6–23)
CO2: 27 mEq/L (ref 19–32)
Calcium: 9.5 mg/dL (ref 8.4–10.5)
Chloride: 103 mEq/L (ref 96–112)
Creat: 0.68 mg/dL (ref 0.50–1.10)
Glucose, Bld: 114 mg/dL — ABNORMAL HIGH (ref 70–99)
Potassium: 3.9 mEq/L (ref 3.5–5.3)
Sodium: 139 mEq/L (ref 135–145)
Total Bilirubin: 0.5 mg/dL (ref 0.2–1.2)
Total Protein: 7.3 g/dL (ref 6.0–8.3)

## 2013-10-15 LAB — POCT GLYCOSYLATED HEMOGLOBIN (HGB A1C): Hemoglobin A1C: 10.7

## 2013-10-15 LAB — MAGNESIUM: Magnesium: 1.7 mg/dL (ref 1.5–2.5)

## 2013-10-15 MED ORDER — LISINOPRIL-HYDROCHLOROTHIAZIDE 20-12.5 MG PO TABS: 2.0000 | ORAL_TABLET | Freq: Every day | ORAL | Status: DC

## 2013-10-15 NOTE — Progress Notes (Signed)
Patient ID: Heidi Howell MRN: [id number], DOB: 09/19/1955, 58 y.o. Date of Encounter: 10/15/2013, 9:40 AM  Primary Physician: Eli Hose, MD  Chief Complaint: Diabetes follow up  HPI: 58 y.o. year old female with history below presents for follow up of diabetes mellitus. Doing well. No issues or complaints. Taking medications daily without adverse effects. No polydipsia, polyphagia, polyuria, or nocturia.  Blood sugars at home:  Around 200, occasionally as low as 125  Exercising regularly.walking in neighborhood.    Patient is looking into gastric bypass surgery but may not qualify because BMI < 35.  She will see dietician next week and surgeon at the end of the month.   Last A1C:  Lab Results  Component Value Date   HGBA1C 9.8 07/09/2013    Eye MD: 2 weeks ago   Past Medical History  Diagnosis Date  . Yeast infection   . Anemia   . H/O fatigue   . Fibroids   . H/O menorrhagia 2001  . Cyst   . Bartholin gland cyst 06/04/2000  . H/O dysmenorrhea 06/04/2000  . Menopausal symptoms   . Diabetes mellitus   . Infertility, female   . Hypertension   . H/O: obesity   . Varicose vein   . Glaucoma   . FH: colon cancer      Home Meds: Prior to Admission medications   Medication Sig Start Date End Date Taking? Authorizing Provider  cyclobenzaprine (FLEXERIL) 5 MG tablet TAKE 1 TABLET TWICE A DAY AS NEEDED FOR MUSCLE SPASMS   Yes Ryan M Dunn, PA-C  Estrogens Conjugated (PREMARIN PO) Take by mouth daily.   Yes Historical Provider, MD  Fluticasone-Salmeterol (ADVAIR) 100-50 MCG/DOSE AEPB Inhale 1 puff into the lungs 2 (two) times daily. 03/05/13  Yes Eleanore E Egan, PA-C  glucose blood (ONE TOUCH ULTRA TEST) test strip 1 each by Other route as needed for other. Use as instructed   Yes Historical Provider, MD  Insulin Glargine (LANTUS) 100 UNIT/ML Solostar Pen Inject 60 Units into the skin every morning. 09/22/13  Yes Renato Shin, MD  Insulin Pen Needle 31G X 5 MM MISC  lantus pen needles 02/26/13  Yes [name], MD  Lancets MISC Test blood sugar 3 times a day. 05/26/13  Yes Eleanore E Elana Alm, PA-C  metoprolol succinate (TOPROL-XL) 100 MG 24 hr tablet Take 1 tablet (100 mg total) by mouth daily. Take with or immediately following a meal. 02/26/13  Yes [name], MD  montelukast (SINGULAIR) 10 MG tablet Take 1 tablet (10 mg total) by mouth at bedtime. 02/26/13  Yes [name], MD  ONE TOUCH ULTRA TEST test strip USE AS DIRECTED   Yes [name], MD  Ospemifene (OSPHENA) 60 MG TABS Take by mouth daily.   Yes Historical Provider, MD  PRESCRIPTION MEDICATION Timolol Mal opt 1 gtt daily   Yes Historical Provider, MD  rOPINIRole (REQUIP) 2 MG tablet Take 0.5 tablets (1 mg total) by mouth at bedtime. 02/26/13  Yes [name], MD  Travoprost, BAK Free, (TRAVATAN) 0.004 % SOLN ophthalmic solution 1 drop at bedtime.   Yes Historical Provider, MD  valsartan-hydrochlorothiazide (DIOVAN-HCT) 160-12.5 MG per tablet TAKE 1 TABLET DAILY 08/16/13  Yes Ryan M Dunn, PA-C  Multiple Vitamin (MULTIVITAMIN) tablet Take 1 tablet by mouth daily.    Historical Provider, MD  zolpidem (AMBIEN) 10 MG tablet Take 1 tablet (10 mg total) by mouth at bedtime as needed for sleep. 04/28/13 07/09/13  [name], MD    Allergies:  No Known Allergies  History   Social History  . Marital Status: Married    Spouse Name: N/A    Number of Children: N/A  . Years of Education: N/A   Occupational History  . Not on file.   Social History Main Topics  . Smoking status: Never Smoker   . Smokeless tobacco: Never Used  . Alcohol Use: No  . Drug Use: No  . Sexual Activity: Yes    Partners: Male     Comment: number of sex partners in the last 75 months 1   Other Topics Concern  . Not on file   Social History Narrative   Exercise walk 2-3 times for 30-40 minutes     Lab Results  Component Value Date   HGBA1C 9.8 07/09/2013    Review of Systems: Constitutional:  negative for chills, fever, night sweats, weight changes, or fatigue  HEENT: negative for vision changes, hearing loss, congestion, rhinorrhea, or epistaxis Cardiovascular: negative for chest pain, palpitations, diaphoresis, DOE, orthopnea, or edema Respiratory: negative for hemoptysis, wheezing, shortness of breath, dyspnea, or cough Abdominal: negative for abdominal pain, nausea, vomiting, diarrhea, or constipation Dermatological: negative for rash, erythema, or wounds Neurologic: negative for headache, dizziness, or syncope Renal:  Negative for polyuria, polydipsia, or dysuria All other systems reviewed and are otherwise negative with the exception to those above and in the HPI.   Physical Exam: Blood pressure 160/90, pulse 67, temperature 98.7 F (37.1 C), temperature source Oral, resp. rate 16, height 5\' 4"  (1.626 m), weight 195 lb (88.451 kg), last menstrual period 01/15/2011, SpO2 98.00%., Body mass index is 33.46 kg/(m^2). Wt Readings from Last 3 Encounters:  10/15/13 195 lb (88.451 kg)  09/29/13 193 lb (87.544 kg)  08/28/13 190 lb (86.183 kg)   BP Readings from Last 3 Encounters:  10/15/13 160/90  09/29/13 130/90  08/28/13 136/80   General: Well developed, well nourished, in no acute distress. Head: Normocephalic, atraumatic, eyes without discharge, sclera non-icteric, nares are without discharge. Bilateral auditory canals clear, TM's are without perforation, pearly grey and translucent with reflective cone of light bilaterally. Oral cavity moist, posterior pharynx without exudate, erythema, peritonsillar abscess, or post nasal drip.  Neck: Supple. No thyromegaly. Full ROM. No lymphadenopathy. Lungs: Clear bilaterally to auscultation without wheezes, rales, or rhonchi. Breathing is unlabored. Heart: RRR with S1 S2. No murmurs, rubs, or gallops appreciated. Abdomen: Soft, non-tender, non-distended with normoactive bowel sounds. No hepatosplenomegaly. No rebound/guarding. No  obvious abdominal masses. Msk:  Strength and tone normal for age. Extremities/Skin: Warm and dry. No clubbing or cyanosis. No edema. No rashes, wounds, or suspicious lesions. Monofilament exam normal.  Neuro: Alert and oriented X 3. Moves all extremities spontaneously. Gait is normal. CNII-XII grossly in tact. Psych:  Responds to questions appropriately with a normal affect.   Results for orders placed in visit on 10/15/13  POCT GLYCOSYLATED HEMOGLOBIN (HGB A1C)      Result Value Ref Range   Hemoglobin A1C 10.7       ASSESSMENT AND PLAN:  58 y.o. year old female with Type II or unspecified type diabetes mellitus without mention of complication, not stated as uncontrolled - Plan: HM Diabetes Foot Exam, POCT glycosylated hemoglobin (Hb A1C), Comprehensive metabolic panel  Hypertension - Plan: lisinopril-hydrochlorothiazide (ZESTORETIC) 20-12.5 MG per tablet  Muscle cramps - Plan: Magnesium  Will see dietician next week. -  Signed, [name], MD 10/15/2013 9:40 AM

## 2013-10-15 NOTE — Patient Instructions (Signed)
Muscle Cramps and Spasms Muscle cramps and spasms occur when a muscle or muscles tighten and you have no control over this tightening (involuntary muscle contraction). They are a common problem and can develop in any muscle. The most common place is in the calf muscles of the leg. Both muscle cramps and muscle spasms are involuntary muscle contractions, but they also have differences:   Muscle cramps are sporadic and painful. They may last a few seconds to a quarter of an hour. Muscle cramps are often more forceful and last longer than muscle spasms.  Muscle spasms may or may not be painful. They may also last just a few seconds or much longer. CAUSES  It is uncommon for cramps or spasms to be due to a serious underlying problem. In many cases, the cause of cramps or spasms is unknown. Some common causes are:   Overexertion.   Overuse from repetitive motions (doing the same thing over and over).   Remaining in a certain position for a long period of time.   Improper preparation, form, or technique while performing a sport or activity.   Dehydration.   Injury.   Side effects of some medicines.   Abnormally low levels of the salts and ions in your blood (electrolytes), especially potassium and calcium. This could happen if you are taking water pills (diuretics) or you are pregnant.  Some underlying medical problems can make it more likely to develop cramps or spasms. These include, but are not limited to:   Diabetes.   Parkinson disease.   Hormone disorders, such as thyroid problems.   Alcohol abuse.   Diseases specific to muscles, joints, and bones.   Blood vessel disease where not enough blood is getting to the muscles.  HOME CARE INSTRUCTIONS   Stay well hydrated. Drink enough water and fluids to keep your urine clear or pale yellow.  It may be helpful to massage, stretch, and relax the affected muscle.  For tight or tense muscles, use a warm towel, heating  pad, or hot shower water directed to the affected area.  If you are sore or have pain after a cramp or spasm, applying ice to the affected area may relieve discomfort.  Put ice in a plastic bag.  Place a towel between your skin and the bag.  Leave the ice on for 15-20 minutes, 03-04 times a day.  Medicines used to treat a known cause of cramps or spasms may help reduce their frequency or severity. Only take over-the-counter or prescription medicines as directed by your caregiver. SEEK MEDICAL CARE IF:  Your cramps or spasms get more severe, more frequent, or do not improve over time.  MAKE SURE YOU:   Understand these instructions.  Will watch your condition.  Will get help right away if you are not doing well or get worse. Document Released: 10/13/2001 Document Revised: 08/18/2012 Document Reviewed: 04/09/2012 [facility] Patient Information 2014 [place], [place]. Diabetes and Exercise Exercising regularly is important. It is not just about losing weight. It has many health benefits, such as:  Improving your overall fitness, flexibility, and endurance.  Increasing your bone density.  Helping with weight control.  Decreasing your body fat.  Increasing your muscle strength.  Reducing stress and tension.  Improving your overall health. People with diabetes who exercise gain additional benefits because exercise:  Reduces appetite.  Improves the body's use of blood sugar (glucose).  Helps lower or control blood glucose.  Decreases blood pressure.  Helps control blood lipids (such as  cholesterol and triglycerides).  Improves the body's use of the hormone insulin by:  Increasing the body's insulin sensitivity.  Reducing the body's insulin needs.  Decreases the risk for heart disease because exercising:  Lowers cholesterol and triglycerides levels.  Increases the levels of good cholesterol (such as high-density lipoproteins [HDL]) in the body.  Lowers blood glucose  levels. YOUR ACTIVITY PLAN  Choose an activity that you enjoy and set realistic goals. Your health care provider or diabetes educator can help you make an activity plan that works for you. You can break activities into 2 or 3 sessions throughout the day. Doing so is as good as one long session. Exercise ideas include:  Taking the dog for a walk.  Taking the stairs instead of the elevator.  Dancing to your favorite song.  Doing your favorite exercise with a friend. RECOMMENDATIONS FOR EXERCISING WITH TYPE 1 OR TYPE 2 DIABETES   Check your blood glucose before exercising. If blood glucose levels are greater than 240 mg/dL, check for urine ketones. Do not exercise if ketones are present.  Avoid injecting insulin into areas of the body that are going to be exercised. For example, avoid injecting insulin into:  The arms when playing tennis.  The legs when jogging.  Keep a record of:  Food intake before and after you exercise.  Expected peak times of insulin action.  Blood glucose levels before and after you exercise.  The type and amount of exercise you have done.  Review your records with your health care provider. Your health care provider will help you to develop guidelines for adjusting food intake and insulin amounts before and after exercising.  If you take insulin or oral hypoglycemic agents, watch for signs and symptoms of hypoglycemia. They include:  Dizziness.  Shaking.  Sweating.  Chills.  Confusion.  Drink plenty of water while you exercise to prevent dehydration or heat stroke. Body water is lost during exercise and must be replaced.  Talk to your health care provider before starting an exercise program to make sure it is safe for you. Remember, almost any type of activity is better than none. Document Released: 07/14/2003 Document Revised: 12/24/2012 Document Reviewed: 09/30/2012 [facility] Patient Information 2014 [place].

## 2013-10-23 ENCOUNTER — Other Ambulatory Visit: Payer: Self-pay | Admitting: Physician Assistant

## 2013-10-27 ENCOUNTER — Ambulatory Visit: Payer: 59 | Admitting: *Deleted

## 2013-11-10 ENCOUNTER — Encounter: Payer: Self-pay | Admitting: Endocrinology

## 2013-11-10 ENCOUNTER — Ambulatory Visit (INDEPENDENT_AMBULATORY_CARE_PROVIDER_SITE_OTHER): Payer: 59 | Admitting: Endocrinology

## 2013-11-10 VITALS — BP 142/86 | HR 96 | Temp 98.7°F | Ht 64.0 in | Wt 198.0 lb

## 2013-11-10 DIAGNOSIS — E109 Type 1 diabetes mellitus without complications: Secondary | ICD-10-CM

## 2013-11-10 MED ORDER — INSULIN DETEMIR 100 UNIT/ML FLEXPEN: 80.0000 [IU] | PEN_INJECTOR | SUBCUTANEOUS | Status: DC

## 2013-11-10 NOTE — Patient Instructions (Signed)
check your blood sugar twice a day.  vary the time of day when you check, between before the 3 meals, and at bedtime.  also check if you have symptoms of your blood sugar being too high or too low.  please keep a record of the readings and bring it to your next appointment here.  You can write it on any piece of paper.  please call us sooner if your blood sugar goes below 70, or if you have a lot of readings over 200.   please change the lantus to levemir, 80 units each morning.   Please come back for a follow-up appointment in 3 months.   Please continue to pursue the weight-loss surgery.

## 2013-11-10 NOTE — Progress Notes (Signed)
Subjective:    Patient ID: Heidi Howell, female    DOB: October 21, 1955, 58 y.o.   MRN: [id number]  HPI pt returns for f/u of insulin-requiring DM (dx'ed 2007, on a routine blood test; she has mild if any neuropathy of the lower extremities; she is unaware of any associated chronic complications; she has been on insulin since 2014; she is pursuing weight-loss surgery; she chose a qd insulin schedule; she has never had pancreatitis, severe hypoglycemia, or DKA).  no cbg record, but states cbg's vary from 91-347.  It is in general higher as the day goes on.  pt states she feels well in general. Past Medical History  Diagnosis Date  . Yeast infection   . Anemia   . H/O fatigue   . Fibroids   . H/O menorrhagia 2001  . Cyst   . Bartholin gland cyst 06/04/2000  . H/O dysmenorrhea 06/04/2000  . Menopausal symptoms   . Diabetes mellitus   . Infertility, female   . Hypertension   . H/O: obesity   . Varicose vein   . Glaucoma   . FH: colon cancer     Past Surgical History  Procedure Laterality Date  . Dilation and curettage of uterus  1989  . Bartholin gland cyst excision  2010  . Cesarean section  [id number]  . Tubal ligation  1996    bilateral    History   Social History  . Marital Status: Married    Spouse Name: N/A    Number of Children: N/A  . Years of Education: N/A   Occupational History  . Not on file.   Social History Main Topics  . Smoking status: Never Smoker   . Smokeless tobacco: Never Used  . Alcohol Use: No  . Drug Use: No  . Sexual Activity: Yes    Partners: Male     Comment: number of sex partners in the last 13 months 1   Other Topics Concern  . Not on file   Social History Narrative   Exercise walk 2-3 times for 30-40 minutes    Current Outpatient Prescriptions on File Prior to Visit  Medication Sig Dispense Refill  . cyclobenzaprine (FLEXERIL) 5 MG tablet TAKE 1 TABLET TWICE A DAY AS NEEDED FOR MUSCLE SPASMS  90 tablet  0  . Estrogens Conjugated  (PREMARIN PO) Take by mouth daily.      . Fluticasone-Salmeterol (ADVAIR) 100-50 MCG/DOSE AEPB Inhale 1 puff into the lungs 2 (two) times daily.  1 each  3  . glucose blood (ONE TOUCH ULTRA TEST) test strip 1 each by Other route as needed for other. Use as instructed      . Insulin Pen Needle 31G X 5 MM MISC lantus pen needles  100 each  2  . Lancets MISC Test blood sugar 3 times a day.  100 each  8  . metoprolol succinate (TOPROL-XL) 100 MG 24 hr tablet Take 1 tablet (100 mg total) by mouth daily. Take with or immediately following a meal.  90 tablet  3  . montelukast (SINGULAIR) 10 MG tablet Take 1 tablet (10 mg total) by mouth at bedtime.  90 tablet  3  . Multiple Vitamin (MULTIVITAMIN) tablet Take 1 tablet by mouth daily.      . ONE TOUCH ULTRA TEST test strip USE AS DIRECTED  300 each  3  . Ospemifene (OSPHENA) 60 MG TABS Take by mouth daily.      Marland Kitchen PRESCRIPTION MEDICATION Timolol Mal opt  1 gtt daily      . rOPINIRole (REQUIP) 2 MG tablet Take 0.5 tablets (1 mg total) by mouth at bedtime.  30 tablet  5  . Travoprost, BAK Free, (TRAVATAN) 0.004 % SOLN ophthalmic solution 1 drop at bedtime.      Marland Kitchen lisinopril-hydrochlorothiazide (ZESTORETIC) 20-12.5 MG per tablet Take 2 tablets by mouth daily.  180 tablet  3  . zolpidem (AMBIEN) 10 MG tablet Take 1 tablet (10 mg total) by mouth at bedtime as needed for sleep.  90 tablet  1   No current facility-administered medications on file prior to visit.    No Known Allergies  Family History  Problem Relation Age of Onset  . Cancer Father     colon  . Cancer Mother     per patient stomach  . Diabetes Maternal Grandmother   . Mental illness Sister   . Heart disease Brother     BP 142/86  Pulse 96  Temp(Src) 98.7 F (37.1 C) (Oral)  Ht 5\' 4"  (1.626 m)  Wt 198 lb (89.812 kg)  BMI 33.97 kg/m2  SpO2 98%  LMP 01/15/2011    Review of Systems She denies hypoglycemia and weight loss.    Objective:   Physical Exam VITAL SIGNS:  See vs  page GENERAL: no distress Pulses: dorsalis pedis intact bilat.   Feet: no deformity. normal color and temp.  no edema Skin:  no ulcer on the feet.   Neuro: sensation is intact to touch on the feet.    Lab Results  Component Value Date   HGBA1C 10.7 10/15/2013      Assessment & Plan:  DM: severe exacerbation: The pattern of his cbg's indicates he needs a faster and shorter-acting insulin. Obesity: This impairs the ability to achieve glycemic control.  I encouraged pt to continue to pursue weight-loss surgery.     Patient is advised the following: Patient Instructions  check your blood sugar twice a day.  vary the time of day when you check, between before the 3 meals, and at bedtime.  also check if you have symptoms of your blood sugar being too high or too low.  please keep a record of the readings and bring it to your next appointment here.  You can write it on any piece of paper.  please call us sooner if your blood sugar goes below 70, or if you have a lot of readings over 200.   please change the lantus to levemir, 80 units each morning.   Please come back for a follow-up appointment in 3 months.   Please continue to pursue the weight-loss surgery.

## 2013-12-03 ENCOUNTER — Encounter: Payer: 59 | Attending: Endocrinology | Admitting: *Deleted

## 2013-12-03 ENCOUNTER — Encounter: Payer: Self-pay | Admitting: *Deleted

## 2013-12-03 VITALS — Ht 64.0 in | Wt 199.8 lb

## 2013-12-03 DIAGNOSIS — Z794 Long term (current) use of insulin: Secondary | ICD-10-CM | POA: Insufficient documentation

## 2013-12-03 DIAGNOSIS — Z713 Dietary counseling and surveillance: Secondary | ICD-10-CM | POA: Insufficient documentation

## 2013-12-03 DIAGNOSIS — E119 Type 2 diabetes mellitus without complications: Secondary | ICD-10-CM | POA: Diagnosis not present

## 2013-12-03 NOTE — Patient Instructions (Signed)
Plan:  Aim for 3 Carb Choices per meal (45 grams) +/- 1 either way  Aim for 0-1 Carbs per snack if hungry  Include protein in moderation with your meals and snacks Consider reading food labels for Total Carbohydrate of foods Continue checking BG at alternate times per day as directed by MD  Continue taking medication: Levemir at same time of day as directed by MD We will discuss ways of  increasing your activity level at next visit.

## 2013-12-03 NOTE — Progress Notes (Signed)
  Medical Nutrition Therapy:  Appt start time: 6967 end time:  1700.  Assessment:  Primary concerns today: 12/03/13. Patient here for nutrition education for diabetes. Here with her husband who appears supportive. She lives with her husband, they share the food shopping her husband cooks the meals. She works for [employer] from 6 am to 2:30 PM Monday through Friday, on her feet all day. She states she SMBG 2-3 times a day with reported range of 200-400 mg/dl pre and post meal. She is active walking at work only. States history of diabetes since 2005. Diabetes education was several years ago. Enjoys getting a pedicure or to shop. Last A1c was 10.7%  Preferred Learning Style:   No preference indicated   Learning Readiness:   Ready  Change in progress  MEDICATIONS: see list, diabetes medications are Levemir   DIETARY INTAKE:  24-hr recall:  B ( AM): oatmeal, OR eggs, grits with 1 slice bacon, 1 slice toast OR unsweet cereal with whole milk, occasionally whole grapefruit, OR Sausage burrito, coffee with creamer and Splenda Snk ( AM): fresh fruit or chips L ( PM): brings from home: sandwich OR left overs OR frozen dinner, water Snk ( PM): chips OR whole grain bars D ( PM): meat, starch, vegetables, bread, occasionally a salad, water Snk ( PM): tries not to,  Beverages: coffee, water  Usual physical activity: walks a lot at work, otherwise, house work  Estimated energy needs: 1400 calories 158 g carbohydrates 105 g protein 39 g fat  Progress Towards Goal(s):  In progress.   Nutritional Diagnosis:  NB-1.1 Food and nutrition-related knowledge deficit As related to Diabetes.  As evidenced by A1c of 10.7%.    Intervention:  Nutrition counseling and diabetes education initiated. Discussed Carb Counting as method of portion control and reading food labels, per patient request, discussed insulin action and provided handout on types of insulin available. Plan to discuss benefits of  increased activity at our next visit.   Plan:  Aim for 3 Carb Choices per meal (45 grams) +/- 1 either way  Aim for 0-1 Carbs per snack if hungry  Include protein in moderation with your meals and snacks Consider reading food labels for Total Carbohydrate of foods Continue checking BG at alternate times per day as directed by MD  Continue taking medication: Levemir at same time of day as directed by MD We will discuss ways of  increasing your activity level at next visit.  Teaching Method Utilized: Visual, Auditory, Hands on  Handouts given during visit include: Living Well with Diabetes Carb Counting and Food Label handouts Meal Plan Card Insulin Action Handout  Barriers to learning/adherence to lifestyle change: none  Demonstrated degree of understanding via:  Teach Back   Monitoring/Evaluation:  Dietary intake, exercise, reading food labels SMBG, and body weight in 6 week(s).

## 2013-12-16 ENCOUNTER — Telehealth: Payer: Self-pay | Admitting: Endocrinology

## 2013-12-16 NOTE — Telephone Encounter (Signed)
Patient stated that since her insulin change her blood sugar reading hasn't been below 200, it has been running from 200 to 400, it was 374 this morning, she is having pain in her back and frequent urination.  Please advise

## 2013-12-16 NOTE — Telephone Encounter (Signed)
Requested call back to discuss insulin dosage.

## 2013-12-16 NOTE — Telephone Encounter (Signed)
Requested call back to discuss.  

## 2013-12-17 NOTE — Telephone Encounter (Signed)
Patient is returning your call.  

## 2013-12-17 NOTE — Telephone Encounter (Signed)
Requested call back to discuss.  

## 2013-12-18 NOTE — Telephone Encounter (Signed)
Requested call back.  

## 2013-12-18 NOTE — Telephone Encounter (Signed)
Requested call back to discuss.  

## 2013-12-21 ENCOUNTER — Other Ambulatory Visit: Payer: Self-pay

## 2013-12-21 ENCOUNTER — Telehealth: Payer: Self-pay

## 2013-12-21 NOTE — Telephone Encounter (Signed)
Pt advised. Pt voiced understanding. Pt will start on new insulin dosage on 8/18.

## 2013-12-21 NOTE — Telephone Encounter (Signed)
Please increase levemir to 90 units qam.  Call in a few days if this does not help.

## 2013-12-21 NOTE — Telephone Encounter (Signed)
Pt called concerning blood sugar readings. Pt states that over the last week her blood sugar has been consistently is the high 200's. Pt states she had 2 reading in the 300's in the morning this past week. Pt confirms she is taking 80 units of Levemir.  Please advise, Thanks!

## 2014-01-13 ENCOUNTER — Ambulatory Visit: Payer: 59 | Admitting: *Deleted

## 2014-01-14 ENCOUNTER — Ambulatory Visit (INDEPENDENT_AMBULATORY_CARE_PROVIDER_SITE_OTHER): Payer: Managed Care, Other (non HMO) | Admitting: Family Medicine

## 2014-01-14 ENCOUNTER — Encounter: Payer: Self-pay | Admitting: Family Medicine

## 2014-01-14 VITALS — BP 168/90 | HR 71 | Temp 98.4°F | Resp 16 | Ht 64.5 in | Wt 194.0 lb

## 2014-01-14 DIAGNOSIS — E1165 Type 2 diabetes mellitus with hyperglycemia: Secondary | ICD-10-CM

## 2014-01-14 DIAGNOSIS — IMO0002 Reserved for concepts with insufficient information to code with codable children: Secondary | ICD-10-CM

## 2014-01-14 DIAGNOSIS — IMO0001 Reserved for inherently not codable concepts without codable children: Secondary | ICD-10-CM

## 2014-01-14 DIAGNOSIS — Z23 Encounter for immunization: Secondary | ICD-10-CM

## 2014-01-14 LAB — POCT GLYCOSYLATED HEMOGLOBIN (HGB A1C): Hemoglobin A1C: 11.2

## 2014-01-14 LAB — COMPREHENSIVE METABOLIC PANEL
ALT: 21 U/L (ref 0–35)
AST: 15 U/L (ref 0–37)
Albumin: 4.2 g/dL (ref 3.5–5.2)
Alkaline Phosphatase: 91 U/L (ref 39–117)
BUN: 13 mg/dL (ref 6–23)
CO2: 26 mEq/L (ref 19–32)
Calcium: 9.7 mg/dL (ref 8.4–10.5)
Chloride: 101 mEq/L (ref 96–112)
Creat: 0.74 mg/dL (ref 0.50–1.10)
Glucose, Bld: 267 mg/dL — ABNORMAL HIGH (ref 70–99)
Potassium: 4.2 mEq/L (ref 3.5–5.3)
Sodium: 137 mEq/L (ref 135–145)
Total Bilirubin: 0.6 mg/dL (ref 0.2–1.2)
Total Protein: 6.9 g/dL (ref 6.0–8.3)

## 2014-01-14 MED ORDER — METFORMIN HCL 1000 MG PO TABS: 1000.0000 mg | ORAL_TABLET | Freq: Every day | ORAL | Status: DC

## 2014-01-14 MED ORDER — INSULIN REGULAR HUMAN 4 UNITS IN POWD: 4.0000 [IU] | Freq: Three times a day (TID) | RESPIRATORY_TRACT | Status: DC

## 2014-01-14 NOTE — Progress Notes (Signed)
58 yo diabetic woman whose lack of control continues to be a problem.  She increased her daily insulin as directed to 90 units daily.  Despite her best efforts, the weight has not changed significantly.  She complains of sweating much of the day and night, particularly after the insulin injection.  Her sugars run over 200 to over 300.  No insulin reactions known.  Husband reports that patient's appetite is ravenous.  Patient has not missed work.  She is getting depressed about the lack of diabetic and weight progress.   She is considering the gastric bypass/lapband surgery.  A friend recently underwent the procedure and she is awaiting the results of her experience.  She is in contact with the surgeons.  She remembers that the metformin and avandia worked well for her in the past.    Objective:  134/84  Wt Readings from Last 3 Encounters:  01/14/14 194 lb (87.998 kg)  12/03/13 199 lb 12.8 oz (90.629 kg)  11/10/13 198 lb (89.812 kg)   Results for orders placed in visit on 01/14/14  POCT GLYCOSYLATED HEMOGLOBIN (HGB A1C)      Result Value Ref Range   Hemoglobin A1C 11.2     HEENT: unremarkable grossly on inspection Chest: clear Heart: regular with I/VI systolic ejection type murmur Abdomen: soft, nontender Extrem:  Normal monofilament, no skin breaks  Assessment:  Discouraged woman with no improvement of blood sugars on high dose insulin.  I think it's worth a trial of metformin at hs, reducing the insulin to 60 units daily, and using the new inhaled Afrezza insulin 4 units before meals.  I will see her back in a month and she will see Dr. Loanne Drilling in several weeks for follow up.  Type 2 diabetes mellitus, uncontrolled - Plan: metFORMIN (GLUCOPHAGE) 1000 MG tablet, Insulin Regular Human (AFREZZA) 4 UNITS POWD, Comprehensive metabolic panel, POCT glycosylated hemoglobin (Hb A1C)  Need for prophylactic vaccination and inoculation against influenza - Plan: Flu Vaccine QUAD 36+ mos  IM  Signed, [name], MD

## 2014-01-18 ENCOUNTER — Ambulatory Visit: Payer: Managed Care, Other (non HMO) | Admitting: Family Medicine

## 2014-01-20 ENCOUNTER — Other Ambulatory Visit: Payer: Self-pay

## 2014-01-20 MED ORDER — CYCLOBENZAPRINE HCL 5 MG PO TABS: ORAL_TABLET | ORAL | Status: DC

## 2014-01-21 ENCOUNTER — Other Ambulatory Visit: Payer: Self-pay | Admitting: Family Medicine

## 2014-02-10 ENCOUNTER — Encounter: Payer: 59 | Attending: Endocrinology | Admitting: *Deleted

## 2014-02-10 ENCOUNTER — Ambulatory Visit: Payer: 59 | Admitting: Endocrinology

## 2014-02-10 DIAGNOSIS — E118 Type 2 diabetes mellitus with unspecified complications: Secondary | ICD-10-CM

## 2014-02-10 DIAGNOSIS — Z713 Dietary counseling and surveillance: Secondary | ICD-10-CM | POA: Insufficient documentation

## 2014-02-10 DIAGNOSIS — E1165 Type 2 diabetes mellitus with hyperglycemia: Secondary | ICD-10-CM | POA: Diagnosis present

## 2014-02-10 DIAGNOSIS — IMO0002 Reserved for concepts with insufficient information to code with codable children: Secondary | ICD-10-CM

## 2014-02-10 DIAGNOSIS — Z794 Long term (current) use of insulin: Secondary | ICD-10-CM | POA: Insufficient documentation

## 2014-02-10 NOTE — Progress Notes (Signed)
  Medical Nutrition Therapy:  Appt start time: 1630 end time:  1700.  Assessment:  Primary concerns today: 02/10/14. Patient here for nutrition education for diabetes. Husband is here with her again, and participates in the visit.Happy with 6 pound weight loss since July. She has started on Afrezza inhaled insulin since last visit. She states it makes her cough and she is not sure she is totally happy with that. She is still SMBG with reported range of 140-280 with occasionally a 300 mg/dl. No reports of any hypoglycemia. Having difficulty with Carb counting.Marland Kitchen   Preferred Learning Style:   No preference indicated   Learning Readiness:   Ready  Change in progress  MEDICATIONS: see list, diabetes medications are Levemir   DIETARY INTAKE:  24-hr recall:  B ( AM): oatmeal, OR eggs, grits with 1 slice bacon, 1 slice toast OR unsweet cereal with whole milk, occasionally whole grapefruit, OR Sausage burrito, coffee with creamer and Splenda Snk ( AM): fresh fruit or chips L ( PM): brings from home: sandwich OR left overs OR frozen dinner, water Snk ( PM): chips OR whole grain bars D ( PM): meat, starch, vegetables, bread, occasionally a salad, water Snk ( PM): tries not to,  Beverages: coffee, water  Usual physical activity: walks a lot at work, otherwise, house work  Estimated energy needs: 1400 calories 158 g carbohydrates 105 g protein 39 g fat  Progress Towards Goal(s):  In progress.   Nutritional Diagnosis:  NB-1.1 Food and nutrition-related knowledge deficit As related to Diabetes.  As evidenced by A1c of 11.2%.    Intervention:  Nutrition counseling and diabetes education initiated. Reviewed Carb Counting as method of portion control and reading food labels.Patient is now on meal time insulin with inhaled Afrezza, she is seeing improvement in her BG's, though not to target yet. Suggested she use her treadmill at home to increase her activity level above walking at work.  Plan:   Aim for 3 Carb Choices per meal (45 grams) +/- 1 either way  Aim for 0-1 Carbs per snack if hungry  Include protein in moderation with your meals and snacks Consider reading food labels for Total Carbohydrate of foods Continue checking BG at alternate times per day as directed by MD  Continue taking medication: Levemir and Afrezza as directed by MD Consider using treadmill at home to increase activity level on rainy days.   Teaching Method Utilized: Visual, Auditory, Hands on  Handouts given during visit include: reviewed previous handouts she brought to this visit  Barriers to learning/adherence to lifestyle change: none  Demonstrated degree of understanding via:  Teach Back   Monitoring/Evaluation:  Dietary intake, exercise, reading food labels SMBG, and body weight 3 months.

## 2014-02-10 NOTE — Patient Instructions (Signed)
Plan:  Aim for 3 Carb Choices per meal (45 grams) +/- 1 either way  Aim for 0-1 Carbs per snack if hungry  Include protein in moderation with your meals and snacks Consider reading food labels for Total Carbohydrate of foods Continue checking BG at alternate times per day as directed by MD  Continue taking medication: Levemir and Afrezza as directed by MD Consider using treadmill at home to increase activity level on rainy days.

## 2014-02-11 ENCOUNTER — Ambulatory Visit: Payer: 59 | Admitting: *Deleted

## 2014-02-11 ENCOUNTER — Ambulatory Visit: Payer: Managed Care, Other (non HMO) | Admitting: Family Medicine

## 2014-02-12 ENCOUNTER — Encounter: Payer: Self-pay | Admitting: Endocrinology

## 2014-02-12 ENCOUNTER — Other Ambulatory Visit: Payer: Self-pay | Admitting: Family Medicine

## 2014-02-12 ENCOUNTER — Ambulatory Visit (INDEPENDENT_AMBULATORY_CARE_PROVIDER_SITE_OTHER): Payer: 59 | Admitting: Endocrinology

## 2014-02-12 VITALS — BP 132/70 | HR 84 | Temp 98.4°F | Ht 64.5 in | Wt 194.0 lb

## 2014-02-12 DIAGNOSIS — T50905A Adverse effect of unspecified drugs, medicaments and biological substances, initial encounter: Secondary | ICD-10-CM | POA: Insufficient documentation

## 2014-02-12 DIAGNOSIS — R05 Cough: Secondary | ICD-10-CM

## 2014-02-12 DIAGNOSIS — T887XXA Unspecified adverse effect of drug or medicament, initial encounter: Secondary | ICD-10-CM

## 2014-02-12 DIAGNOSIS — R059 Cough, unspecified: Secondary | ICD-10-CM

## 2014-02-12 MED ORDER — INSULIN DETEMIR 100 UNIT/ML FLEXPEN: 100.0000 [IU] | PEN_INJECTOR | SUBCUTANEOUS | Status: DC

## 2014-02-12 MED ORDER — FLUTICASONE-SALMETEROL 100-50 MCG/DOSE IN AEPB: 1.0000 | INHALATION_SPRAY | Freq: Two times a day (BID) | RESPIRATORY_TRACT | Status: DC

## 2014-02-12 NOTE — Patient Instructions (Addendum)
check your blood sugar twice a day.  vary the time of day when you check, between before the 3 meals, and at bedtime.  also check if you have symptoms of your blood sugar being too high or too low.  please keep a record of the readings and bring it to your next appointment here.  You can write it on any piece of paper.  please call us sooner if your blood sugar goes below 70, or if you have a lot of readings over 200.   Please increase levemir to 100 units each morning.   Please come back for a follow-up appointment in 3 months.   Please continue to pursue the weight-loss surgery.   Keep the scrape on your leg covered with antibiotic ointment and a bandaid, until it heals.

## 2014-02-12 NOTE — Progress Notes (Signed)
Subjective:    Patient ID: Heidi Howell, female    DOB: December 16, 1955, 58 y.o.   MRN: [id number]  HPI Pt returns for f/u of diabetes mellitus: DM type: Insulin-requiring type 2 Dx'ed: 4098 Complications: none Therapy: insulin since 2014 GDM: never DKA: never Severe hypoglycemia: never Pancreatitis: never Other: she is pursuing weight-loss surgery; she chose a qd insulin schedule Interval history: Since last ov, she has started afrezza.  no cbg record, but states cbg's vary from 140-300's.  It is lowest in the afternoon.  She is still working towards weight-loss surgery. She has a few days of slight abrasion at the right leg, but no assoc drainage.   Past Medical History  Diagnosis Date  . Yeast infection   . Anemia   . H/O fatigue   . Fibroids   . H/O menorrhagia 2001  . Cyst   . Bartholin gland cyst 06/04/2000  . H/O dysmenorrhea 06/04/2000  . Menopausal symptoms   . Diabetes mellitus   . Infertility, female   . Hypertension   . H/O: obesity   . Varicose vein   . Glaucoma   . FH: colon cancer     Past Surgical History  Procedure Laterality Date  . Dilation and curettage of uterus  1989  . Bartholin gland cyst excision  2010  . Cesarean section  [id number]  . Tubal ligation  1996    bilateral    History   Social History  . Marital Status: Married    Spouse Name: N/A    Number of Children: N/A  . Years of Education: N/A   Occupational History  . Not on file.   Social History Main Topics  . Smoking status: Never Smoker   . Smokeless tobacco: Never Used  . Alcohol Use: No  . Drug Use: No  . Sexual Activity: Yes    Partners: Male     Comment: number of sex partners in the last 29 months 1   Other Topics Concern  . Not on file   Social History Narrative   Exercise walk 2-3 times for 30-40 minutes    Current Outpatient Prescriptions on File Prior to Visit  Medication Sig Dispense Refill  . ADVAIR DISKUS 100-50 MCG/DOSE AEPB INHALE 1 PUFF INTO THE  LUNGS TWICE A DAY  1 each  2  . aspirin 81 MG tablet Take 81 mg by mouth daily.      . cyclobenzaprine (FLEXERIL) 5 MG tablet TAKE 1 TABLET TWICE A DAY AS NEEDED FOR MUSCLE SPASMS  180 tablet  0  . Estrogens Conjugated (PREMARIN PO) Take by mouth daily.      Marland Kitchen glucose blood (ONE TOUCH ULTRA TEST) test strip 1 each by Other route as needed for other. Use as instructed      . Insulin Pen Needle 31G X 5 MM MISC lantus pen needles  100 each  2  . Insulin Regular Human (AFREZZA) 4 UNITS POWD Inhale 4 Units into the lungs 3 (three) times daily before meals.  90 each  11  . Lancets MISC Test blood sugar 3 times a day.  100 each  8  . lisinopril-hydrochlorothiazide (ZESTORETIC) 20-12.5 MG per tablet Take 2 tablets by mouth daily.  180 tablet  3  . medroxyPROGESTERone (PROVERA) 5 MG tablet Take 5 mg by mouth daily.      . metFORMIN (GLUCOPHAGE) 1000 MG tablet Take 1 tablet (1,000 mg total) by mouth at bedtime.  90 tablet  3  . metoprolol  succinate (TOPROL-XL) 100 MG 24 hr tablet Take 1 tablet (100 mg total) by mouth daily. Take with or immediately following a meal.  90 tablet  3  . montelukast (SINGULAIR) 10 MG tablet Take 1 tablet (10 mg total) by mouth at bedtime.  90 tablet  3  . Multiple Vitamin (MULTIVITAMIN) tablet Take 1 tablet by mouth daily.      . ONE TOUCH ULTRA TEST test strip USE AS DIRECTED  300 each  3  . Ospemifene (OSPHENA) 60 MG TABS Take by mouth daily.      Marland Kitchen PRESCRIPTION MEDICATION Timolol Mal opt 1 gtt daily      . rOPINIRole (REQUIP) 2 MG tablet TAKE ONE-HALF TABLET (1 MG) AT BEDTIME  30 tablet  4  . Travoprost, BAK Free, (TRAVATAN) 0.004 % SOLN ophthalmic solution 1 drop at bedtime.      Marland Kitchen zolpidem (AMBIEN) 10 MG tablet Take 1 tablet (10 mg total) by mouth at bedtime as needed for sleep.  90 tablet  1   No current facility-administered medications on file prior to visit.    No Known Allergies  Family History  Problem Relation Age of Onset  . Cancer Father     colon  .  Cancer Mother     per patient stomach  . Diabetes Maternal Grandmother   . Mental illness Sister   . Heart disease Brother     BP 132/70  Pulse 84  Temp(Src) 98.4 F (36.9 C) (Oral)  Ht 5' 4.5" (1.638 m)  Wt 194 lb (87.998 kg)  BMI 32.80 kg/m2  SpO2 95%  LMP 01/15/2011    Review of Systems She denies hypoglycemia.  She has lost 5 lbs.     Objective:   Physical Exam VITAL SIGNS:  See vs page.   GENERAL: no distress. Pulses: dorsalis pedis intact bilat.   Feet: no deformity.  no edema.  Skin:  no ulcer on the feet.  normal color and temp.  There is a 2 cm abrasion at the right leg.   Neuro: sensation is intact to touch on the feet.       i reviewed spirometry    Assessment & Plan:  Abrasion, new DM: she needs increased rx. Obesity: she needs surgery.  Patient is advised the following: Patient Instructions  check your blood sugar twice a day.  vary the time of day when you check, between before the 3 meals, and at bedtime.  also check if you have symptoms of your blood sugar being too high or too low.  please keep a record of the readings and bring it to your next appointment here.  You can write it on any piece of paper.  please call us sooner if your blood sugar goes below 70, or if you have a lot of readings over 200.   Please increase levemir to 100 units each morning.   Please come back for a follow-up appointment in 3 months.   Please continue to pursue the weight-loss surgery.   Keep the scrape on your leg covered with antibiotic ointment and a bandaid, until it heals.

## 2014-02-15 ENCOUNTER — Ambulatory Visit: Payer: Managed Care, Other (non HMO) | Admitting: Family Medicine

## 2014-02-16 ENCOUNTER — Telehealth: Payer: Self-pay | Admitting: *Deleted

## 2014-02-16 NOTE — Telephone Encounter (Signed)
Advised pt husband- she has an appt 10/19 and will discuss then.

## 2014-02-16 NOTE — Telephone Encounter (Signed)
Called pharmacy and cancelled script for [place].

## 2014-02-16 NOTE — Telephone Encounter (Signed)
That's correct.  I should not have ordered the afrezza.  Please have patient return to discuss other medications.

## 2014-02-16 NOTE — Telephone Encounter (Signed)
  Pharmacy called to report they are unable to fill Afrezza due to pt also being on Advair. These two medications are contraindicated if pt is using Advair for COPD or Chronic Asthma. Please advise.   [phone] Ref [id number]

## 2014-02-22 ENCOUNTER — Encounter: Payer: Self-pay | Admitting: Family Medicine

## 2014-02-22 ENCOUNTER — Ambulatory Visit (INDEPENDENT_AMBULATORY_CARE_PROVIDER_SITE_OTHER): Payer: Managed Care, Other (non HMO) | Admitting: Family Medicine

## 2014-02-22 VITALS — BP 157/86 | HR 79 | Temp 98.2°F | Resp 16 | Ht 64.25 in | Wt 191.0 lb

## 2014-02-22 DIAGNOSIS — IMO0002 Reserved for concepts with insufficient information to code with codable children: Secondary | ICD-10-CM

## 2014-02-22 DIAGNOSIS — E1165 Type 2 diabetes mellitus with hyperglycemia: Secondary | ICD-10-CM

## 2014-02-22 LAB — POCT GLYCOSYLATED HEMOGLOBIN (HGB A1C): Hemoglobin A1C: 5.4

## 2014-02-22 MED ORDER — INSULIN REGULAR HUMAN 4 UNITS IN POWD: 4.0000 [IU] | Freq: Three times a day (TID) | RESPIRATORY_TRACT | Status: DC

## 2014-02-22 MED ORDER — PNEUMOCOCCAL 13-VAL CONJ VACC IM SUSP: 0.5000 mL | INTRAMUSCULAR | Status: DC

## 2014-02-22 NOTE — Progress Notes (Signed)
Patient ID: Heidi Howell MRN: [id number], DOB: [birth year], 58 y.o. Date of Encounter: 02/22/2014, 9:54 AM  Primary Physician: [name], MD  Chief Complaint: Diabetes follow up  HPI: 58 y.o. year old female with history below presents for follow up of diabetes mellitus. Pt states she is happy with her new powdered insulin, and has seen a positive reduction in her blood sugar. Pt states she is taking 100 units of insulin per day, in one sitting. Pt is considering gastric bypass surgery. Pt has lost six pounds, and is hopeful that she will continue losing weight. Pt states she is doing well. No issues or complaints. Taking medications daily without adverse effects. No polydipsia, polyphagia, polyuria, or nocturia. Pt states she is exercising occasionally, and is planning on increasing her exercise. Pt received a flu vaccine this season. Patient works at the [employer] [address]. Last A1C: 11.2  Past Medical History  Diagnosis Date  . Yeast infection   . Anemia   . H/O fatigue   . Fibroids   . H/O menorrhagia 2001  . Cyst   . Bartholin gland cyst 06/04/2000  . H/O dysmenorrhea 06/04/2000  . Menopausal symptoms   . Diabetes mellitus   . Infertility, female   . Hypertension   . H/O: obesity   . Varicose vein   . Glaucoma   . FH: colon cancer      Home Meds: Prior to Admission medications   Medication Sig Start Date End Date Taking? Authorizing Provider  ADVAIR DISKUS 100-50 MCG/DOSE AEPB INHALE 1 PUFF INTO THE LUNGS TWICE A DAY 01/26/14  Yes Mancel Bale, PA-C  aspirin 81 MG tablet Take 81 mg by mouth daily.   Yes Historical Provider, MD  cyclobenzaprine (FLEXERIL) 5 MG tablet TAKE 1 TABLET TWICE A DAY AS NEEDED FOR MUSCLE SPASMS 01/20/14  Yes [name], MD  Estrogens Conjugated (PREMARIN PO) Take by mouth daily.   Yes Historical Provider, MD  Fluticasone-Salmeterol (ADVAIR) 100-50 MCG/DOSE AEPB Inhale 1 puff into the lungs 2 (two) times daily. 02/12/14  Yes  [name], MD  glucose blood (ONE TOUCH ULTRA TEST) test strip 1 each by Other route as needed for other. Use as instructed   Yes Historical Provider, MD  Insulin Detemir (LEVEMIR) 100 UNIT/ML Pen Inject 100 Units into the skin every morning. And pen needles 1/day 02/12/14  Yes Renato Shin, MD  Insulin Pen Needle 31G X 5 MM MISC lantus pen needles 02/26/13  Yes [name], MD  Insulin Regular Human (AFREZZA) 4 UNITS POWD Inhale 4 Units into the lungs 3 (three) times daily before meals. 01/14/14  Yes [name], MD  Lancets MISC Test blood sugar 3 times a day. 05/26/13  Yes Eleanore Kurtis Bushman, PA-C  lisinopril-hydrochlorothiazide (ZESTORETIC) 20-12.5 MG per tablet Take 2 tablets by mouth daily. 10/15/13  Yes [name], MD  medroxyPROGESTERone (PROVERA) 5 MG tablet Take 5 mg by mouth daily.   Yes Historical Provider, MD  metFORMIN (GLUCOPHAGE) 1000 MG tablet Take 1 tablet (1,000 mg total) by mouth at bedtime. 01/14/14  Yes [name], MD  metoprolol succinate (TOPROL-XL) 100 MG 24 hr tablet Take 1 tablet (100 mg total) by mouth daily. Take with or immediately following a meal. 02/26/13  Yes [name], MD  montelukast (SINGULAIR) 10 MG tablet Take 1 tablet (10 mg total) by mouth at bedtime. 02/26/13  Yes [name], MD  Multiple Vitamin (MULTIVITAMIN) tablet Take 1 tablet by mouth daily.   Yes Historical Provider,  MD  ONE TOUCH ULTRA TEST test strip USE AS DIRECTED   Yes [name], MD  Ospemifene (OSPHENA) 60 MG TABS Take by mouth daily.   Yes Historical Provider, MD  PRESCRIPTION MEDICATION Timolol Mal opt 1 gtt daily   Yes Historical Provider, MD  rOPINIRole (REQUIP) 2 MG tablet TAKE ONE-HALF TABLET (1 MG) AT BEDTIME 01/26/14  Yes Sarah Alleen Borne, PA-C  Travoprost, BAK Free, (TRAVATAN) 0.004 % SOLN ophthalmic solution 1 drop at bedtime.   Yes Historical Provider, MD  zolpidem (AMBIEN) 10 MG tablet Take 1 tablet (10 mg total) by mouth at bedtime as needed for sleep.  04/28/13 07/09/13  [name], MD    Allergies: No Known Allergies  History   Social History  . Marital Status: Married    Spouse Name: N/A    Number of Children: N/A  . Years of Education: N/A   Occupational History  . Not on file.   Social History Main Topics  . Smoking status: Never Smoker   . Smokeless tobacco: Never Used  . Alcohol Use: No  . Drug Use: No  . Sexual Activity: Yes    Partners: Male     Comment: number of sex partners in the last 91 months 1   Other Topics Concern  . Not on file   Social History Narrative   Exercise walk 2-3 times for 30-40 minutes     Lab Results  Component Value Date   HGBA1C 11.2 01/14/2014    Review of Systems: Constitutional: negative for chills, fever, night sweats, weight changes, or fatigue  HEENT: negative for vision changes, hearing loss, congestion, rhinorrhea, or epistaxis Cardiovascular: negative for chest pain, palpitations, diaphoresis, DOE, orthopnea, or edema Respiratory: negative for hemoptysis, wheezing, shortness of breath, dyspnea, or cough Abdominal: negative for abdominal pain, nausea, vomiting, diarrhea, or constipation Dermatological: negative for rash, erythema, or wounds Neurologic: negative for headache, dizziness, or syncope Renal:  Negative for polyuria, polydipsia, or dysuria All other systems reviewed and are otherwise negative with the exception to those above and in the HPI.   Physical Exam: Blood pressure 157/86, pulse 79, temperature 98.2 F (36.8 C), temperature source Oral, resp. rate 16, height 5' 4.25" (1.632 m), weight 191 lb (86.637 kg), last menstrual period 01/15/2011, SpO2 100.00%., Body mass index is 32.53 kg/(m^2). Wt Readings from Last 3 Encounters:  02/22/14 191 lb (86.637 kg)  02/12/14 194 lb (87.998 kg)  01/14/14 194 lb (87.998 kg)   BP Readings from Last 3 Encounters:  02/22/14 157/86  02/12/14 132/70  01/14/14 168/90   General: Well developed, well nourished, in no  acute distress. Head: Normocephalic, atraumatic, eyes without discharge, sclera non-icteric, nares are without discharge. Bilateral auditory canals clear, TM's are without perforation, pearly grey and translucent with reflective cone of light bilaterally. Oral cavity moist, posterior pharynx without exudate, erythema, peritonsillar abscess, or post nasal drip.  Neck: Supple. No thyromegaly. Full ROM. No lymphadenopathy. Lungs: Clear bilaterally to auscultation without wheezes, rales, or rhonchi. Breathing is unlabored. Heart: RRR with S1 S2. No murmurs, rubs, or gallops appreciated. Abdomen: Soft, non-tender, non-distended with normoactive bowel sounds. No hepatosplenomegaly. No rebound/guarding. No obvious abdominal masses. Msk:  Strength and tone normal for age. Extremities/Skin: Warm and dry. No clubbing or cyanosis. No edema. No rashes, wounds, or suspicious lesions.  Neuro: Alert and oriented X 3. Moves all extremities spontaneously. Gait is normal. CNII-XII grossly in tact. Psych:  Responds to questions appropriately with a normal affect.   Labs: Results for  orders placed in visit on 01/14/14  COMPREHENSIVE METABOLIC PANEL      Result Value Ref Range   Sodium 137  135 - 145 mEq/L   Potassium 4.2  3.5 - 5.3 mEq/L   Chloride 101  96 - 112 mEq/L   CO2 26  19 - 32 mEq/L   Glucose, Bld 267 (*) 70 - 99 mg/dL   BUN 13  6 - 23 mg/dL   Creat 0.74  0.50 - 1.10 mg/dL   Total Bilirubin 0.6  0.2 - 1.2 mg/dL   Alkaline Phosphatase 91  39 - 117 U/L   AST 15  0 - 37 U/L   ALT 21  0 - 35 U/L   Total Protein 6.9  6.0 - 8.3 g/dL   Albumin 4.2  3.5 - 5.2 g/dL   Calcium 9.7  8.4 - 10.5 mg/dL  POCT GLYCOSYLATED HEMOGLOBIN (HGB A1C)      Result Value Ref Range   Hemoglobin A1C 11.2     hgb A1C:  10.5   ASSESSMENT AND PLAN:  58 y.o. year old female with type 2 diabetes. She sounds good response to her efforts over the last month with a 3 pound weight loss and a drop in the A1c. I realize that her  inhaled insulin is contraindicated with asthma, but she's been tested with pulmonary function and found to have no decrease with the inhaled insulin. Moreover, she is losing weight  in response to it. I've asked her to come back in a month so we can recheck things. Also asked her to split her insulin dose to 50 units in the morning and 50 at night. We encouraged regular exercise and continue weight loss.  Signed, [name], MD 02/22/2014 9:54 AM

## 2014-02-22 NOTE — Patient Instructions (Addendum)
Return in one month for followup to see if you can continue losing weight.  Pneumococcal Conjugate Vaccine: What You Need to Know Your doctor recommends that you, or your child, get a dose of PCV13 today. 1. Why get vaccinated? Pneumococcal conjugate vaccine (called PCV13 or Prevnar 13) is recommended to protect infants and toddlers, and some older children and adults with certain health conditions, from pneumococcal disease. Pneumococcal disease is caused by infection with Streptococcus pneumoniae bacteria. These bacteria can spread from person to person through close contact. Pneumococcal disease can lead to severe health problems, including pneumonia, blood infections, and meningitis. Meningitis is an infection of the covering of the brain. Pneumococcal meningitis is fairly rare (less than 1 case per 100,000 people each year), but it leads to other health problems, including deafness and brain damage. In children, it is fatal in about 1 case out of 10. Children younger than two are at higher risk for serious disease than older children. People with certain medical conditions, people over age 70, and cigarette smokers are also at higher risk. Before vaccine, pneumococcal infections caused many problems each year in the [place] in children younger than 5, including:  more than 700 cases of meningitis,  13,000 blood infections,  about 5 million ear infections, and  about 200 deaths. About 4,000 adults still die each year because of pneumococcal infections. Pneumococcal infections can be hard to treat because some strains are resistant to antibiotics. This makes prevention through vaccination even more important. 2. PCV13 vaccine There are more than 90 types of pneumococcal bacteria. PCV13 protects against 13 of them. These 13 strains cause most severe infections in children and about half of infections in adults.  PCV13 is routinely given to children at 2, 4, 6, and 79-67 months of age.  Children in this age range are at greatest risk for serious diseases caused by pneumococcal infection. PCV13 vaccine may also be recommended for some older children or adults. Your doctor can give you details. A second type of pneumococcal vaccine, called PPSV23, may also be given to some children and adults, including anyone over age 71. There is a separate Vaccine Information Statement for this vaccine. 3. Precautions  Anyone who has ever had a life-threatening allergic reaction to a dose of this vaccine, to an earlier pneumococcal vaccine called PCV7 (or Prevnar), or to any vaccine containing diphtheria toxoid (for example, DTaP), should not get PCV13. Anyone with a severe allergy to any component of PCV13 should not get the vaccine. Tell your doctor if the person being vaccinated has any severe allergies. If the person scheduled for vaccination is sick, your doctor might decide to reschedule the shot on another day. Your doctor can give you more information about any of these precautions. 4. What are the risks of PCV13 vaccine?  With any medicine, including vaccines, there is a chance of side effects. These are usually mild and go away on their own, but serious reactions are also possible. Reported problems associated with PCV13 vary by dose and age, but generally:  About half of children became drowsy after the shot, had a temporary loss of appetite, or had redness or tenderness where the shot was given.  About 1 out of 3 had swelling where the shot was given.  About 1 out of 3 had a mild fever, and about 1 in 20 had a higher fever (over 102.35F).  Up to about 8 out of 10 became fussy or irritable. Adults receiving the vaccine have reported  redness, pain, and swelling where the shot was given. Mild fever, fatigue, headache, chills, or muscle pain have also been reported. Life-threatening allergic reactions from any vaccine are very rare. 5. What if there is a serious reaction? What should  I look for?  Look for anything that concerns you, such as signs of a severe allergic reaction, very high fever, or behavior changes. Signs of a severe allergic reaction can include hives, swelling of the face and throat, difficulty breathing, a fast heartbeat, dizziness, and weakness. These would start a few minutes to a few hours after the vaccination. What should I do?  If you think it is a severe allergic reaction or other emergency that can't wait, call 9-1-1 or get the person to the nearest hospital. Otherwise, call your doctor.  Afterward, the reaction should be reported to the Vaccine Adverse Event Reporting System (VAERS). Your doctor might file this report, or you can do it yourself through the VAERS web site at www.vaers.SamedayNews.es, or by calling (209)180-1183. VAERS is only for reporting reactions. They do not give medical advice. 6. The National Vaccine Injury Compensation Program The [employer] Vaccine Injury Compensation Program (VICP) is a federal program that was created to compensate people who may have been injured by certain vaccines. Persons who believe they may have been injured by a vaccine can learn about the program and about filing a claim by calling 606-687-7385 or visiting the [place] website at GoldCloset.com.ee. 7. How can I learn more?  Ask your doctor.  Call your local or state health department.  Contact the Centers for Disease Control and Prevention (CDC):  Call 309-685-2165 (1-800-CDC-INFO) or  Visit CDC's website at http://hunter.com/ CDC PCV13 Vaccine VIS (Interim) (07/04/11) Document Released: 02/18/2006 Document Revised: 09/07/2013 Document Reviewed: 06/12/2013 [facility] Patient Information 2015 [place], [place]. This information is not intended to replace advice given to you by your health care provider. Make sure you discuss any questions you have with your health care provider.

## 2014-02-24 ENCOUNTER — Ambulatory Visit: Payer: 59 | Admitting: *Deleted

## 2014-02-25 NOTE — Telephone Encounter (Signed)
Sent fax to [facility] to cancel this prescription per telephone messages. This medication is not appropriate for pt due to COPD/Asthma medications.

## 2014-02-25 NOTE — Telephone Encounter (Signed)
[facility] cannot read the fax script that was sent over for [place].  Please resend  [phone]  Ref [id number]

## 2014-02-26 ENCOUNTER — Telehealth: Payer: Self-pay | Admitting: Family Medicine

## 2014-02-26 NOTE — Telephone Encounter (Signed)
Please call the patient.  There is a concern with her use of advair and Afrezza.  Dr Joseph Art would like her to stop the Advair until he sees her next month.  If her breathing is acting up she may have to stop the Afrezza but if her breathing is doing well that is great.

## 2014-02-26 NOTE — Telephone Encounter (Addendum)
Spoke with the pharmacist Olean Ree - who is concerned about her use of Afrezza and Advair.  I researched her record and it looks like she is tolerating the Afrezza well with a normal spirometry prior to the start of this medication.  She is on Advair but does not have a diagnosis of asthma or COPD - it looks like she was placed on the Advair for a reactive airway problem, ? Related to allergies.  The p[harmacy will give her a 3 month supply with 3 RFs.  She will continue the f/u for 3 weeks to recheck with [place].  Between now and then she will stop her Advair and see how her breathing goes.  If her breathing gets worse she will RTC sooner - if she has trouble with her breathing we will need to change her Afrezza per the pharmacy.  Written by Lupe Carney

## 2014-02-26 NOTE — Telephone Encounter (Deleted)
Please

## 2014-02-26 NOTE — Telephone Encounter (Signed)
Left message on machine to call back  

## 2014-02-28 NOTE — Telephone Encounter (Signed)
lmom to cb. 

## 2014-03-01 NOTE — Telephone Encounter (Signed)
LM for rtn call How did pt start [place] when prescription was cancelled 10/16? New script was sent and pharmacy has pending.

## 2014-03-04 ENCOUNTER — Other Ambulatory Visit: Payer: Self-pay

## 2014-03-04 DIAGNOSIS — Z9109 Other allergy status, other than to drugs and biological substances: Secondary | ICD-10-CM

## 2014-03-04 MED ORDER — MONTELUKAST SODIUM 10 MG PO TABS: 10.0000 mg | ORAL_TABLET | Freq: Every day | ORAL | Status: DC

## 2014-03-04 NOTE — Telephone Encounter (Signed)
Dr L, you just saw pt for DM check up, but don't see allergies discussed recently. Do you want to give RFs?

## 2014-03-08 ENCOUNTER — Ambulatory Visit (INDEPENDENT_AMBULATORY_CARE_PROVIDER_SITE_OTHER): Payer: Managed Care, Other (non HMO) | Admitting: Family Medicine

## 2014-03-08 VITALS — BP 170/80 | HR 87 | Temp 98.2°F | Resp 18 | Ht 65.0 in | Wt 194.2 lb

## 2014-03-08 DIAGNOSIS — R110 Nausea: Secondary | ICD-10-CM

## 2014-03-08 DIAGNOSIS — R42 Dizziness and giddiness: Secondary | ICD-10-CM

## 2014-03-08 DIAGNOSIS — R519 Headache, unspecified: Secondary | ICD-10-CM

## 2014-03-08 DIAGNOSIS — R51 Headache: Secondary | ICD-10-CM

## 2014-03-08 LAB — GLUCOSE, POCT (MANUAL RESULT ENTRY): POC Glucose: 247 mg/dL — AB (ref 70–99)

## 2014-03-08 NOTE — Patient Instructions (Signed)
Increase the Levemir to 60 units twice daily  See how you do with the ears washed out. If the dizziness is getting it all worse come in and get rechecked at anytime sooner. Otherwise plan to keep your regular appointment with Dr. Joseph Art.

## 2014-03-08 NOTE — Progress Notes (Signed)
Subjective: Regular patient of Dr. Carlean Jews who comes in here tonight with history of having had dizziness for the last several days. Her husband urged her to come in. She has been feeling stuffy and developed hearing on the right side. She just doesn't feel well. They have been contemplating getting a surgical gastric bypass type procedure because of her obesity and difficulty losing weight and persistent poorly controlled diabetes despite a high dose of insulin and metformin. She does check her sugars at home. She has been working with a nutritionist.  Objective: Patient obviously doesn't feel real well tonight. Her TMs are normal the left but occluded with a large plug of cerumen on the right. Eyes PERRLA. Throat clear. Neck supple without nodes. Chest clear. Heart regularno arrhythmias. Alternating movements finger to nose normal. Gait started out with a little bit of staggering but then she's strengthened up and walk fine. Romberg essentially negative her balance is not perfect. Patient is alert and oriented.  Assessment: Cerumen impaction right ear Dizziness Poorly controlled diabetes mellitus  Plan: We will see how she does after the irrigating of the wax. The TM is entirely normal after a large plug of wax in been removed.  Increase the Levemir to 60 units twice daily.  I decided to not add any other new diabetic medications, but told the patient and her husband that I would suggest Dr. Joseph Art might want to consider the possibility of adding Invokana to her regimen. It will probably be a small before she gets to the process of approval for any gastric bypass type surgeries. This could enable some weight loss along with improved control of the diabetes in the meanwhile.I will leave that decision to Dr. Carlean Jews. She is scheduled to see him the end of November.   Results for orders placed or performed in visit on 03/08/14  POCT glucose (manual entry)  Result Value Ref Range   POC Glucose 247 (A)  70 - 99 mg/dl

## 2014-03-10 ENCOUNTER — Other Ambulatory Visit: Payer: Self-pay | Admitting: Family Medicine

## 2014-03-18 ENCOUNTER — Encounter: Payer: Self-pay | Admitting: Family Medicine

## 2014-03-22 ENCOUNTER — Telehealth: Payer: Self-pay | Admitting: Family Medicine

## 2014-03-22 NOTE — Telephone Encounter (Signed)
FMLA paperwork completed on 03/19/2014 and faxed on 03/22/2014. Called patient to come pick up original copy.

## 2014-03-24 ENCOUNTER — Ambulatory Visit (INDEPENDENT_AMBULATORY_CARE_PROVIDER_SITE_OTHER): Payer: 59 | Admitting: Family Medicine

## 2014-03-24 VITALS — BP 160/84 | HR 86 | Temp 98.2°F | Resp 20 | Ht 64.0 in | Wt 192.2 lb

## 2014-03-24 DIAGNOSIS — E119 Type 2 diabetes mellitus without complications: Secondary | ICD-10-CM | POA: Diagnosis not present

## 2014-03-24 DIAGNOSIS — J0101 Acute recurrent maxillary sinusitis: Secondary | ICD-10-CM

## 2014-03-24 DIAGNOSIS — R059 Cough, unspecified: Secondary | ICD-10-CM

## 2014-03-24 DIAGNOSIS — R05 Cough: Secondary | ICD-10-CM | POA: Diagnosis not present

## 2014-03-24 MED ORDER — AMOXICILLIN 875 MG PO TABS: 875.0000 mg | ORAL_TABLET | Freq: Two times a day (BID) | ORAL | Status: DC

## 2014-03-24 MED ORDER — HYDROCOD POLST-CHLORPHEN POLST 10-8 MG/5ML PO LQCR: 5.0000 mL | Freq: Two times a day (BID) | ORAL | Status: DC | PRN

## 2014-03-24 MED ORDER — CANAGLIFLOZIN 100 MG PO TABS: 100.0000 mg | ORAL_TABLET | Freq: Every day | ORAL | Status: DC

## 2014-03-24 NOTE — Patient Instructions (Signed)
Stop the [employer] Drop the levemir back to 40 units daily Start amoxicillin 875 twice a day Cough medicine at night

## 2014-03-24 NOTE — Progress Notes (Signed)
Patient ID: Heidi Howell MRN: [id number], DOB: 10/03/1955, 58 y.o. Date of Encounter: 03/24/2014, 8:14 PM  This chart was scribed for Dr. [name], MD by Erling Conte, Medical Scribe. This patient was seen in Room 13 and the patient's care was started at 8:02 PM.  Primary Physician: [name], MD  Chief Complaint:  Chief Complaint  Patient presents with  . Cough    sinus drainage.  cough that is not productive x 2 weeks.       HPI: 58 y.o. year old female with history below presents with a non productive cough for two weeks. She is having associated right otalgia, decreased hearing in right ear, tinnitus, congestion and dizziness. She denies any sore throat, fever, chills, or abdominal complaints.    Pt was in the office on 03/08/14 with a HA and dizziness. She was seen by Dr. Linna Darner and he increased her Levemir to 60 units. She states that she has not seen any relief with that and would like to go back to her previous dose of 40 units.   Past Medical History  Diagnosis Date  . Yeast infection   . Anemia   . H/O fatigue   . Fibroids   . H/O menorrhagia 2001  . Cyst   . Bartholin gland cyst 06/04/2000  . H/O dysmenorrhea 06/04/2000  . Menopausal symptoms   . Diabetes mellitus   . Infertility, female   . Hypertension   . H/O: obesity   . Varicose vein   . Glaucoma   . FH: colon cancer      Home Meds: Prior to Admission medications   Medication Sig Start Date End Date Taking? Authorizing Provider  ADVAIR DISKUS 100-50 MCG/DOSE AEPB INHALE 1 PUFF INTO THE LUNGS TWICE A DAY 01/26/14  Yes Mancel Bale, PA-C  aspirin 81 MG tablet Take 81 mg by mouth daily.   Yes Historical Provider, MD  cyclobenzaprine (FLEXERIL) 5 MG tablet TAKE 1 TABLET TWICE A DAY AS NEEDED FOR MUSCLE SPASMS 01/20/14  Yes [name], MD  Estrogens Conjugated (PREMARIN PO) Take by mouth daily.   Yes Historical Provider, MD  Fluticasone-Salmeterol (ADVAIR) 100-50 MCG/DOSE AEPB Inhale 1  puff into the lungs 2 (two) times daily. 02/12/14  Yes [name], MD  glucose blood (ONE TOUCH ULTRA TEST) test strip 1 each by Other route as needed for other. Use as instructed   Yes Historical Provider, MD  Insulin Detemir (LEVEMIR) 100 UNIT/ML Pen Inject 100 Units into the skin every morning. And pen needles 1/day 02/12/14  Yes Renato Shin, MD  Insulin Pen Needle 31G X 5 MM MISC lantus pen needles 02/26/13  Yes [name], MD  Insulin Regular Human (AFREZZA) 4 UNITS POWD Inhale 4 Units into the lungs 3 (three) times daily before meals. 02/22/14  Yes [name], MD  Lancets MISC Test blood sugar 3 times a day. 05/26/13  Yes Eleanore Kurtis Bushman, PA-C  lisinopril-hydrochlorothiazide (ZESTORETIC) 20-12.5 MG per tablet Take 2 tablets by mouth daily. 10/15/13  Yes [name], MD  medroxyPROGESTERone (PROVERA) 5 MG tablet Take 5 mg by mouth daily.   Yes Historical Provider, MD  metFORMIN (GLUCOPHAGE) 1000 MG tablet Take 1 tablet (1,000 mg total) by mouth at bedtime. 01/14/14  Yes [name], MD  metoprolol succinate (TOPROL-XL) 100 MG 24 hr tablet TAKE 1 TABLET DAILY WITH OR IMMEDIATELY FOLLOWING A MEAL 03/11/14  Yes [name], MD  montelukast (SINGULAIR) 10 MG tablet Take 1 tablet (10 mg total) by mouth  at bedtime. 03/04/14  Yes [name], MD  Multiple Vitamin (MULTIVITAMIN) tablet Take 1 tablet by mouth daily.   Yes Historical Provider, MD  ONE TOUCH ULTRA TEST test strip USE AS DIRECTED   Yes [name], MD  Ospemifene (OSPHENA) 60 MG TABS Take by mouth daily.   Yes Historical Provider, MD  PRESCRIPTION MEDICATION Timolol Mal opt 1 gtt daily   Yes Historical Provider, MD  rOPINIRole (REQUIP) 2 MG tablet TAKE ONE-HALF TABLET (1 MG) AT BEDTIME 01/26/14  Yes Sarah Alleen Borne, PA-C  Travoprost, BAK Free, (TRAVATAN) 0.004 % SOLN ophthalmic solution 1 drop at bedtime.   Yes Historical Provider, MD  zolpidem (AMBIEN) 10 MG tablet Take 1 tablet (10 mg total) by mouth at bedtime  as needed for sleep. 04/28/13 07/09/13  [name], MD    Allergies: No Known Allergies  History   Social History  . Marital Status: Married    Spouse Name: N/A    Number of Children: N/A  . Years of Education: N/A   Occupational History  . Not on file.   Social History Main Topics  . Smoking status: Never Smoker   . Smokeless tobacco: Never Used  . Alcohol Use: No  . Drug Use: No  . Sexual Activity:    Partners: Male     Comment: number of sex partners in the last 2 months 1   Other Topics Concern  . Not on file   Social History Narrative   Exercise walk 2-3 times for 30-40 minutes     Review of Systems: Constitutional: negative for chills, fever, night sweats, weight changes, or fatigue  HEENT: positive for cough, congestion, otalgia, post nasal drip, decreased hearing and rhinorrhea. negative for vision changes, epistaxis, or sore throat Cardiovascular: negative for chest pain or palpitations Respiratory: negative for hemoptysis, wheezing, shortness of breath, or cough Abdominal: negative for abdominal pain, nausea, vomiting, diarrhea, or constipation Dermatological: negative for rash Neurologic: negative for headache, dizziness, or syncope All other systems reviewed and are otherwise negative with the exception to those above and in the HPI.   Physical Exam: Blood pressure 160/84, pulse 86, temperature 98.2 F (36.8 C), temperature source Oral, resp. rate 20, height 5\' 4"  (1.626 m), weight 192 lb 3.2 oz (87.181 kg), last menstrual period 01/15/2011, SpO2 98 %., Body mass index is 32.97 kg/(m^2). General: Well developed, well nourished, in no acute distress. Head: Normocephalic, atraumatic, eyes without discharge, sclera non-icteric, nares are without discharge. Bilateral auditory canals clear, TM's are bilaterally opaque. Both TMs are thickened with fluid behind them. Oral cavity moist, posterior pharynx without exudate, erythema, peritonsillar abscess, or post  nasal drip.  Neck: Supple. No thyromegaly. Full ROM. No lymphadenopathy. Lungs: Clear bilaterally to auscultation without wheezes or rhonchi. Bibasilar rales present. Breathing is unlabored. Heart: RRR with S1 S2. No murmurs, rubs, or gallops appreciated. Abdomen: Soft, non-tender, non-distended with normoactive bowel sounds. No hepatomegaly. No rebound/guarding. No obvious abdominal masses. Msk:  Strength and tone normal for age. Extremities/Skin: Warm and dry. No clubbing or cyanosis. No edema. No rashes or suspicious lesions. Neuro: Alert and oriented X 3. Moves all extremities spontaneously. Gait is normal. CNII-XII grossly in tact. Psych:  Responds to questions appropriately with a normal affect.    ASSESSMENT AND PLAN:  58 y.o. year old female with   1. Cough   2. Recurrent maxillary sinusitis, unspecified chronicity   3. Diabetes mellitus type 2, uncomplicated     Meds ordered this encounter  Medications  .  canagliflozin (INVOKANA) 100 MG TABS tablet    Sig: Take 1 tablet (100 mg total) by mouth daily.    Dispense:  30 tablet    Refill:  6  . amoxicillin (AMOXIL) 875 MG tablet    Sig: Take 1 tablet (875 mg total) by mouth 2 (two) times daily.    Dispense:  20 tablet    Refill:  0  . chlorpheniramine-HYDROcodone (TUSSIONEX PENNKINETIC ER) 10-8 MG/5ML LQCR    Sig: Take 5 mLs by mouth every 12 (twelve) hours as needed.    Dispense:  115 mL    Refill:  0    I personally performed the services described in this documentation, which was scribed in my presence. The recorded information has been reviewed and is accurate.    Signed, [name], MD 03/24/2014 8:14 PM

## 2014-03-26 DIAGNOSIS — Z0271 Encounter for disability determination: Secondary | ICD-10-CM

## 2014-04-05 ENCOUNTER — Ambulatory Visit: Payer: Managed Care, Other (non HMO) | Admitting: Family Medicine

## 2014-04-08 ENCOUNTER — Telehealth: Payer: Self-pay

## 2014-04-08 DIAGNOSIS — R05 Cough: Secondary | ICD-10-CM

## 2014-04-08 DIAGNOSIS — R059 Cough, unspecified: Secondary | ICD-10-CM

## 2014-04-08 NOTE — Telephone Encounter (Signed)
Pt called in and states she is still sick. She has used the cough medication and antibiotic. She wants to know if Dr Joseph Art will refill these scripts. She an be reached @  [phone]. Thank you

## 2014-04-09 MED ORDER — HYDROCODONE-HOMATROPINE 5-1.5 MG/5ML PO SYRP: 5.0000 mL | ORAL_SOLUTION | Freq: Three times a day (TID) | ORAL | Status: DC | PRN

## 2014-04-09 NOTE — Telephone Encounter (Signed)
Pt still has a lot of sinus congestion and drainage. Pt says the cough is worse than before; It is keeping her up at night. I told her she may need to RTC since her cough is worse. She has an appointment with Dr. Joseph Art on 04/15/14. Can we prescribe her anything to help with her cough?

## 2014-04-09 NOTE — Telephone Encounter (Signed)
Notified pt cough med Rx ready to p/up.

## 2014-04-14 ENCOUNTER — Other Ambulatory Visit: Payer: Self-pay | Admitting: Family Medicine

## 2014-04-15 ENCOUNTER — Encounter (HOSPITAL_COMMUNITY): Payer: Self-pay | Admitting: Cardiology

## 2014-04-15 ENCOUNTER — Encounter: Payer: Self-pay | Admitting: Family Medicine

## 2014-04-15 ENCOUNTER — Ambulatory Visit (INDEPENDENT_AMBULATORY_CARE_PROVIDER_SITE_OTHER): Payer: 59 | Admitting: Family Medicine

## 2014-04-15 ENCOUNTER — Emergency Department (HOSPITAL_COMMUNITY): Payer: 59

## 2014-04-15 ENCOUNTER — Emergency Department (HOSPITAL_COMMUNITY)
Admission: EM | Admit: 2014-04-15 | Discharge: 2014-04-15 | Disposition: A | Discharge status: Home / Self Care | Payer: 59 | Attending: Emergency Medicine | Admitting: Emergency Medicine

## 2014-04-15 ENCOUNTER — Encounter: Payer: Managed Care, Other (non HMO) | Admitting: Family Medicine

## 2014-04-15 VITALS — BP 175/82 | HR 97 | Temp 97.9°F | Resp 16 | Ht 64.0 in | Wt 190.0 lb

## 2014-04-15 DIAGNOSIS — Z79899 Other long term (current) drug therapy: Secondary | ICD-10-CM | POA: Diagnosis not present

## 2014-04-15 DIAGNOSIS — E1165 Type 2 diabetes mellitus with hyperglycemia: Secondary | ICD-10-CM | POA: Insufficient documentation

## 2014-04-15 DIAGNOSIS — H409 Unspecified glaucoma: Secondary | ICD-10-CM | POA: Insufficient documentation

## 2014-04-15 DIAGNOSIS — Z794 Long term (current) use of insulin: Secondary | ICD-10-CM | POA: Insufficient documentation

## 2014-04-15 DIAGNOSIS — I10 Essential (primary) hypertension: Secondary | ICD-10-CM | POA: Insufficient documentation

## 2014-04-15 DIAGNOSIS — Z7951 Long term (current) use of inhaled steroids: Secondary | ICD-10-CM | POA: Diagnosis not present

## 2014-04-15 DIAGNOSIS — Z87448 Personal history of other diseases of urinary system: Secondary | ICD-10-CM | POA: Diagnosis not present

## 2014-04-15 DIAGNOSIS — K5900 Constipation, unspecified: Secondary | ICD-10-CM | POA: Diagnosis not present

## 2014-04-15 DIAGNOSIS — R799 Abnormal finding of blood chemistry, unspecified: Secondary | ICD-10-CM | POA: Diagnosis present

## 2014-04-15 DIAGNOSIS — R059 Cough, unspecified: Secondary | ICD-10-CM

## 2014-04-15 DIAGNOSIS — R05 Cough: Secondary | ICD-10-CM

## 2014-04-15 DIAGNOSIS — Z8742 Personal history of other diseases of the female genital tract: Secondary | ICD-10-CM | POA: Diagnosis not present

## 2014-04-15 DIAGNOSIS — H60391 Other infective otitis externa, right ear: Secondary | ICD-10-CM

## 2014-04-15 DIAGNOSIS — Z7982 Long term (current) use of aspirin: Secondary | ICD-10-CM | POA: Insufficient documentation

## 2014-04-15 DIAGNOSIS — R739 Hyperglycemia, unspecified: Secondary | ICD-10-CM

## 2014-04-15 DIAGNOSIS — J209 Acute bronchitis, unspecified: Secondary | ICD-10-CM

## 2014-04-15 DIAGNOSIS — Z8619 Personal history of other infectious and parasitic diseases: Secondary | ICD-10-CM | POA: Insufficient documentation

## 2014-04-15 DIAGNOSIS — Z862 Personal history of diseases of the blood and blood-forming organs and certain disorders involving the immune mechanism: Secondary | ICD-10-CM | POA: Diagnosis not present

## 2014-04-15 DIAGNOSIS — E119 Type 2 diabetes mellitus without complications: Secondary | ICD-10-CM

## 2014-04-15 DIAGNOSIS — E669 Obesity, unspecified: Secondary | ICD-10-CM | POA: Insufficient documentation

## 2014-04-15 DIAGNOSIS — Z0000 Encounter for general adult medical examination without abnormal findings: Secondary | ICD-10-CM

## 2014-04-15 LAB — COMPREHENSIVE METABOLIC PANEL
ALK PHOS: 80 U/L (ref 39–117)
ALT: 19 U/L (ref 0–35)
AST: 16 U/L (ref 0–37)
Albumin: 4.1 g/dL (ref 3.5–5.2)
Anion gap: 18 — ABNORMAL HIGH (ref 5–15)
BUN: 15 mg/dL (ref 6–23)
CHLORIDE: 98 meq/L (ref 96–112)
CO2: 23 mEq/L (ref 19–32)
Calcium: 9.9 mg/dL (ref 8.4–10.5)
Creatinine, Ser: 0.7 mg/dL (ref 0.50–1.10)
GFR calc Af Amer: >90 mL/min (ref >90)
GFR calc non Af Amer: >90 mL/min (ref >90)
GLUCOSE: 264 mg/dL — AB (ref 70–99)
Potassium: 3.9 mEq/L (ref 3.7–5.3)
SODIUM: 139 meq/L (ref 137–147)
TOTAL PROTEIN: 8 g/dL (ref 6.0–8.3)
Total Bilirubin: 0.5 mg/dL (ref 0.3–1.2)

## 2014-04-15 LAB — CBC
HEMATOCRIT: 35.9 % — AB (ref 36.0–46.0)
Hemoglobin: 12.2 g/dL (ref 12.0–15.0)
MCH: 27.7 pg (ref 26.0–34.0)
MCHC: 34 g/dL (ref 30.0–36.0)
MCV: 81.4 fL (ref 78.0–100.0)
Platelets: 289 10*3/uL (ref 150–400)
RBC: 4.41 MIL/uL (ref 3.87–5.11)
RDW: 12.9 % (ref 11.5–15.5)
WBC: 18.1 10*3/uL — AB (ref 4.0–10.5)

## 2014-04-15 LAB — POCT URINALYSIS DIPSTICK
Bilirubin, UA: NEGATIVE
Glucose, UA: >=1000
Ketones, UA: 15
Nitrite, UA: NEGATIVE
Spec Grav, UA: 1.01
Urobilinogen, UA: 0.2
pH, UA: 5

## 2014-04-15 LAB — CBG MONITORING, ED
GLUCOSE-CAPILLARY: 217 mg/dL — AB (ref 70–99)
Glucose-Capillary: 252 mg/dL — ABNORMAL HIGH (ref 70–99)
Glucose-Capillary: 276 mg/dL — ABNORMAL HIGH (ref 70–99)
Glucose-Capillary: 320 mg/dL — ABNORMAL HIGH (ref 70–99)

## 2014-04-15 MED ORDER — SODIUM CHLORIDE 0.9 % IV BOLUS (SEPSIS)
1000.0000 mL | Freq: Once | INTRAVENOUS | Status: AC
  Administered 2014-04-15: 1000 mL via INTRAVENOUS

## 2014-04-15 MED ORDER — INSULIN ASPART 100 UNIT/ML ~~LOC~~ SOLN
7.0000 [IU] | Freq: Once | SUBCUTANEOUS | Status: AC
  Administered 2014-04-15: 7 [IU] via INTRAVENOUS
  Filled 2014-04-15: qty 1

## 2014-04-15 MED ORDER — BENZONATATE 100 MG PO CAPS
200.0000 mg | ORAL_CAPSULE | Freq: Once | ORAL | Status: AC
  Administered 2014-04-15: 200 mg via ORAL
  Filled 2014-04-15: qty 2

## 2014-04-15 MED ORDER — INSULIN ASPART 100 UNIT/ML ~~LOC~~ SOLN
5.0000 [IU] | Freq: Once | SUBCUTANEOUS | Status: AC
  Administered 2014-04-15: 5 [IU] via INTRAVENOUS
  Filled 2014-04-15: qty 1

## 2014-04-15 MED ORDER — BENZONATATE 200 MG PO CAPS: 200.0000 mg | ORAL_CAPSULE | Freq: Three times a day (TID) | ORAL | Status: DC | PRN

## 2014-04-15 NOTE — ED Notes (Signed)
Pt reports she was at her PCP office for a routine physical, states that she had labs drawn and was called and told to come to the hospital for follow up on abnormal lab. Pt states that she is unsure about what lab was abnormal.

## 2014-04-15 NOTE — Patient Instructions (Signed)

## 2014-04-15 NOTE — ED Notes (Signed)
CBG Taken = 217

## 2014-04-15 NOTE — Progress Notes (Signed)
Subjective:    Patient ID: Heidi Howell, female    DOB: 07-07-1955, 58 y.o.   MRN: [id number]  This chart was scribed for [name], MD by Chester Holstein, ED Scribe. This patient was seen in room 26 and the patient's care was started at 2:49 PM.   HPI  HPI Comments: Heidi Howell is a 58 y.o. female who presents to the [facility] for her annual exam.  Pt has a dry cough.  She notes the cough disrupts her sleep. She states reclining causes her to feel as if something is "scratching," causing her to cough. Pt has not coughed in the last 3 hours. Husband notes the sound is disturbing and interrupts his sleep as well. Pt has been taking her montelukast and states it is giving her constipation, causing her visible discomfort. Pt notes she took an enema on the way here for relief. Pt is fasting. Pt has otalgia and swelling in her ear. She notes she has noticed some drainage. She states it has improved since her last visit.  Pt has a PMHx of glaucoma and uses her eye drops.  She denies any eye pain. Pt is utd on her mammogram and vaccines. Pt has not been missing work. She would like a note for tomorrow. Her husband [name] is with her today.   Past Medical History  Diagnosis Date  . Yeast infection   . Anemia   . H/O fatigue   . Fibroids   . H/O menorrhagia 2001  . Cyst   . Bartholin gland cyst 06/04/2000  . H/O dysmenorrhea 06/04/2000  . Menopausal symptoms   . Diabetes mellitus   . Infertility, female   . Hypertension   . H/O: obesity   . Varicose vein   . Glaucoma   . FH: colon cancer     Past Surgical History  Procedure Laterality Date  . Dilation and curettage of uterus  1989  . Bartholin gland cyst excision  2010  . Cesarean section  [id number]  . Tubal ligation  1996    bilateral    Family History  Problem Relation Age of Onset  . Cancer Father     colon  . Cancer Mother     per patient stomach  . Diabetes Maternal Grandmother   . Mental illness  Sister   . Heart disease Brother     History   Social History  . Marital Status: Married    Spouse Name: Madelaine Etienne    Number of Children: N/A  . Years of Education: N/A   Occupational History  . Not on file.   Social History Main Topics  . Smoking status: Never Smoker   . Smokeless tobacco: Never Used  . Alcohol Use: No  . Drug Use: No  . Sexual Activity:    Partners: Male     Comment: number of sex partners in the last 29 months 1   Other Topics Concern  . Not on file   Social History Narrative   Exercise walk 2-3 times for 30-40 minutes    No Known Allergies    Review of Systems  HENT: Positive for ear discharge and ear pain.   Eyes: Negative for pain.  Respiratory: Positive for cough.   Gastrointestinal: Positive for constipation.       Objective:   Physical Exam  Constitutional: She is oriented to person, place, and time. She appears well-developed and well-nourished.  HENT:  Head: Normocephalic.  Eyes:  Conjunctivae are normal.  Neck: Normal range of motion. Neck supple.  Pulmonary/Chest: Effort normal.  Musculoskeletal: Normal range of motion.  Neurological: She is alert and oriented to person, place, and time.  Skin: Skin is warm and dry.  Psychiatric: She has a normal mood and affect. Her behavior is normal.  Nursing note and vitals reviewed.  Examination chaperoned by Chester Holstein.   Results for orders placed or performed in visit on 04/15/14  POCT urinalysis dipstick  Result Value Ref Range   Color, UA Yellow    Clarity, UA Clear    Glucose, UA >=1000    Bilirubin, UA Negative    Ketones, UA 15    Spec Grav, UA 1.010    Blood, UA Small    pH, UA 5.0    Protein, UA Trace    Urobilinogen, UA 0.2    Nitrite, UA Negative    Leukocytes, UA Trace    Patient's gait is unstable and she appears somnolent and somewhat depressed.     BP 175/82 mmHg  Pulse 97  Temp(Src) 97.9 F (36.6 C)  Resp 16  Ht 5\' 4"  (1.626 m)  Wt 190 lb  (86.183 kg)  BMI 32.60 kg/m2  SpO2 99%  LMP 01/15/2011  Assessment & Plan:   This chart was scribed in my presence and reviewed by me personally.    ICD-9-CM ICD-10-CM   1. Annual physical exam V70.0 Z00.00 Comprehensive metabolic panel  2. Type 2 diabetes mellitus without complication 188.41 Y60.6 Microalbumin, urine     Lipid panel     POCT urinalysis dipstick     Hemoglobin A1c  3. Cough 786.2 R05 benzonatate (TESSALON) 200 MG capsule  4. Acute bronchitis, unspecified organism 466.0 J20.9   5. Otitis, externa, infective, right 380.10 H60.391    After leaving the office, the urine sugar came back greater than 1000. In addition the urine shows ketones. She's been called to advise her to go to the emergency room to get this sugar under better control tonight.  Signed, [name], MD      BP 175/82 mmHg  Pulse 97  Temp(Src) 97.9 F (36.6 C)  Resp 16  Ht 5\' 4"  (1.626 m)  Wt 190 lb (86.183 kg)  BMI 32.60 kg/m2  SpO2 99%  LMP 01/15/2011

## 2014-04-15 NOTE — ED Notes (Signed)
Pt reports having an annual visit with PCP with labs drawn. Pt was told to come to the ED for followup of glucose in urine. Pt is diabetic, reports readings "have been pretty good numbers." Pt also reports taking medication for cough x 1 month in which constipation is stated as a side effect. Has been taking OTC medications for constipation with relief. Reports burning with urination. Denies fever and chills. Has been unable to lay flat due to cough. Productive cough with clear phlegm

## 2014-04-15 NOTE — Discharge Instructions (Signed)
Cough, Adult  A cough is a reflex. It helps you clear your throat and airways. A cough can help heal your body. A cough can last 2 or 3 weeks (acute) or may last more than 8 weeks (chronic). Some common causes of a cough can include an infection, allergy, or a cold. HOME CARE  Only take medicine as told by your doctor.  If given, take your medicines (antibiotics) as told. Finish them even if you start to feel better.  Use a cold steam vaporizer or humidifier in your home. This can help loosen thick spit (secretions).  Sleep so you are almost sitting up (semi-upright). Use pillows to do this. This helps reduce coughing.  Rest as needed.  Stop smoking if you smoke. GET HELP RIGHT AWAY IF:  You have yellowish-white fluid (pus) in your thick spit.  Your cough gets worse.  Your medicine does not reduce coughing, and you are losing sleep.  You cough up blood.  You have trouble breathing.  Your pain gets worse and medicine does not help.  You have a fever. MAKE SURE YOU:   Understand these instructions.  Will watch your condition.  Will get help right away if you are not doing well or get worse. Document Released: 01/04/2011 Document Revised: 09/07/2013 Document Reviewed: 01/04/2011 [facility] Patient Information 2015 [place], [place]. This information is not intended to replace advice given to you by your health care provider. Make sure you discuss any questions you have with your health care provider.   Hyperglycemia Hyperglycemia occurs when the glucose (sugar) in your blood is too high. Hyperglycemia can happen for many reasons, but it most often happens to people who do not know they have diabetes or are not managing their diabetes properly.  CAUSES  Whether you have diabetes or not, there are other causes of hyperglycemia. Hyperglycemia can occur when you have diabetes, but it can also occur in other situations that you might not be as aware of, such as: Diabetes  If you  have diabetes and are having problems controlling your blood glucose, hyperglycemia could occur because of some of the following reasons:  Not following your meal plan.  Not taking your diabetes medications or not taking it properly.  Exercising less or doing less activity than you normally do.  Being sick. Pre-diabetes  This cannot be ignored. Before people develop Type 2 diabetes, they almost always have "pre-diabetes." This is when your blood glucose levels are higher than normal, but not yet high enough to be diagnosed as diabetes. Research has shown that some long-term damage to the body, especially the heart and circulatory system, may already be occurring during pre-diabetes. If you take action to manage your blood glucose when you have pre-diabetes, you may delay or prevent Type 2 diabetes from developing. Stress  If you have diabetes, you may be "diet" controlled or on oral medications or insulin to control your diabetes. However, you may find that your blood glucose is higher than usual in the hospital whether you have diabetes or not. This is often referred to as "stress hyperglycemia." Stress can elevate your blood glucose. This happens because of hormones put out by the body during times of stress. If stress has been the cause of your high blood glucose, it can be followed regularly by your caregiver. That way he/she can make sure your hyperglycemia does not continue to get worse or progress to diabetes. Steroids  Steroids are medications that act on the infection fighting system (immune system)  to block inflammation or infection. One side effect can be a rise in blood glucose. Most people can produce enough extra insulin to allow for this rise, but for those who cannot, steroids make blood glucose levels go even higher. It is not unusual for steroid treatments to "uncover" diabetes that is developing. It is not always possible to determine if the hyperglycemia will go away after the  steroids are stopped. A special blood test called an A1c is sometimes done to determine if your blood glucose was elevated before the steroids were started. SYMPTOMS  Thirsty.  Frequent urination.  Dry mouth.  Blurred vision.  Tired or fatigue.  Weakness.  Sleepy.  Tingling in feet or leg. DIAGNOSIS  Diagnosis is made by monitoring blood glucose in one or all of the following ways:  A1c test. This is a chemical found in your blood.  Fingerstick blood glucose monitoring.  Laboratory results. TREATMENT  First, knowing the cause of the hyperglycemia is important before the hyperglycemia can be treated. Treatment may include, but is not be limited to:  Education.  Change or adjustment in medications.  Change or adjustment in meal plan.  Treatment for an illness, infection, etc.  More frequent blood glucose monitoring.  Change in exercise plan.  Decreasing or stopping steroids.  Lifestyle changes. HOME CARE INSTRUCTIONS   Test your blood glucose as directed.  Exercise regularly. Your caregiver will give you instructions about exercise. Pre-diabetes or diabetes which comes on with stress is helped by exercising.  Eat wholesome, balanced meals. Eat often and at regular, fixed times. Your caregiver or nutritionist will give you a meal plan to guide your sugar intake.  Being at an ideal weight is important. If needed, losing as little as 10 to 15 pounds may help improve blood glucose levels. SEEK MEDICAL CARE IF:   You have questions about medicine, activity, or diet.  You continue to have symptoms (problems such as increased thirst, urination, or weight gain). SEEK IMMEDIATE MEDICAL CARE IF:   You are vomiting or have diarrhea.  Your breath smells fruity.  You are breathing faster or slower.  You are very sleepy or incoherent.  You have numbness, tingling, or pain in your feet or hands.  You have chest pain.  Your symptoms get worse even though you have  been following your caregiver's orders.  If you have any other questions or concerns. Document Released: 10/17/2000 Document Revised: 07/16/2011 Document Reviewed: 08/20/2011 [facility] Patient Information 2015 [place], [place]. This information is not intended to replace advice given to you by your health care provider. Make sure you discuss any questions you have with your health care provider.

## 2014-04-15 NOTE — ED Provider Notes (Signed)
CSN: [id number]     Arrival date & time 04/15/14  1744 History   First MD Initiated Contact with Patient 04/15/14 1909     Chief Complaint  Patient presents with  . Abnormal Lab     (Consider location/radiation/quality/duration/timing/severity/associated sxs/prior Treatment) HPI Comments: Patient was called by PCPs office and told to come to the emergency room today for an abnormal lab.  She states she went to the office for a yearly checkup, but that she has also had one month of frequent coughing with clear sputum.  No fever, chills, chest pain, shortness of breath, leg pain or swelling.  She has been on 2 different officer presents with codeine and has since become constipated.  She took a laxative today was able to have a bowel movement this morning.   Past Medical History  Diagnosis Date  . Yeast infection   . Anemia   . H/O fatigue   . Fibroids   . H/O menorrhagia 2001  . Cyst   . Bartholin gland cyst 06/04/2000  . H/O dysmenorrhea 06/04/2000  . Menopausal symptoms   . Diabetes mellitus   . Infertility, female   . Hypertension   . H/O: obesity   . Varicose vein   . Glaucoma   . FH: colon cancer    Past Surgical History  Procedure Laterality Date  . Dilation and curettage of uterus  1989  . Bartholin gland cyst excision  2010  . Cesarean section  [id number]  . Tubal ligation  1996    bilateral   Family History  Problem Relation Age of Onset  . Cancer Father     colon  . Cancer Mother     per patient stomach  . Diabetes Maternal Grandmother   . Mental illness Sister   . Heart disease Brother    History  Substance Use Topics  . Smoking status: Never Smoker   . Smokeless tobacco: Never Used  . Alcohol Use: No   OB History    No data available     Review of Systems  Constitutional: Negative for fever, chills, diaphoresis, activity change, appetite change and fatigue.  HENT: Negative for congestion, facial swelling, rhinorrhea and sore throat.   Eyes:  Negative for photophobia and discharge.  Respiratory: Positive for cough. Negative for chest tightness and shortness of breath.   Cardiovascular: Negative for chest pain, palpitations and leg swelling.  Gastrointestinal: Positive for constipation. Negative for nausea, vomiting, abdominal pain and diarrhea.  Endocrine: Negative for polydipsia and polyuria.  Genitourinary: Negative for dysuria, frequency, difficulty urinating and pelvic pain.  Musculoskeletal: Negative for back pain, arthralgias, neck pain and neck stiffness.  Skin: Negative for color change and wound.  Allergic/Immunologic: Negative for immunocompromised state.  Neurological: Negative for facial asymmetry, weakness, numbness and headaches.  Hematological: Does not bruise/bleed easily.  Psychiatric/Behavioral: Negative for confusion and agitation.      Allergies  Review of patient's allergies indicates no known allergies.  Home Medications   Prior to Admission medications   Medication Sig Start Date End Date Taking? Authorizing Provider  aspirin 81 MG tablet Take 81 mg by mouth daily.   Yes Historical Provider, MD  canagliflozin (INVOKANA) 100 MG TABS tablet Take 1 tablet (100 mg total) by mouth daily. 03/24/14  Yes [name], MD  cyclobenzaprine (FLEXERIL) 5 MG tablet TAKE 1 TABLET TWICE A DAY AS NEEDED FOR MUSCLE SPASMS 01/20/14  Yes [name], MD  estrogens, conjugated, (PREMARIN) 0.3 MG tablet Take 0.3 mg by mouth  daily. Take daily for 21 days then do not take for 7 days.   Yes Historical Provider, MD  Fluticasone-Salmeterol (ADVAIR) 100-50 MCG/DOSE AEPB Inhale 1 puff into the lungs 2 (two) times daily. 02/12/14  Yes [name], MD  Insulin Detemir (LEVEMIR) 100 UNIT/ML Pen Inject 100 Units into the skin every morning. And pen needles 1/day Patient taking differently: Inject 40 Units into the skin every morning. And pen needles 1/day 02/12/14  Yes Renato Shin, MD  lisinopril-hydrochlorothiazide  (ZESTORETIC) 20-12.5 MG per tablet Take 2 tablets by mouth daily. 10/15/13  Yes [name], MD  medroxyPROGESTERone (PROVERA) 5 MG tablet Take 5 mg by mouth daily.   Yes Historical Provider, MD  metFORMIN (GLUCOPHAGE) 1000 MG tablet Take 1 tablet (1,000 mg total) by mouth at bedtime. 01/14/14  Yes [name], MD  metoprolol succinate (TOPROL-XL) 100 MG 24 hr tablet TAKE 1 TABLET DAILY WITH OR IMMEDIATELY FOLLOWING A MEAL 03/11/14  Yes [name], MD  montelukast (SINGULAIR) 10 MG tablet Take 1 tablet (10 mg total) by mouth at bedtime. 03/04/14  Yes [name], MD  Multiple Vitamin (MULTIVITAMIN) tablet Take 1 tablet by mouth daily.   Yes Historical Provider, MD  rOPINIRole (REQUIP) 2 MG tablet TAKE ONE-HALF TABLET (1 MG) AT BEDTIME 01/26/14  Yes Sarah Alleen Borne, PA-C  Travoprost, BAK Free, (TRAVATAN) 0.004 % SOLN ophthalmic solution Place 1 drop into both eyes at bedtime.    Yes Historical Provider, MD  ADVAIR DISKUS 100-50 MCG/DOSE AEPB INHALE 1 PUFF INTO THE LUNGS TWICE A DAY Patient not taking: Reported on 04/15/2014 01/26/14   Mancel Bale, PA-C  benzonatate (TESSALON) 200 MG capsule Take 1 capsule (200 mg total) by mouth 3 (three) times daily as needed for cough. 04/15/14   [name], MD  zolpidem (AMBIEN) 10 MG tablet Take 1 tablet (10 mg total) by mouth at bedtime as needed for sleep. 04/28/13 07/09/13  [name], MD   BP 145/74 mmHg  Pulse 102  Temp(Src) 98.7 F (37.1 C) (Oral)  Resp 16  SpO2 100%  LMP 01/15/2011 Physical Exam  Constitutional: She is oriented to person, place, and time. She appears well-developed and well-nourished. No distress.  HENT:  Head: Normocephalic and atraumatic.  Mouth/Throat: No oropharyngeal exudate.  Eyes: Pupils are equal, round, and reactive to light.  Neck: Normal range of motion. Neck supple.  Cardiovascular: Normal rate, regular rhythm and normal heart sounds.  Exam reveals no gallop and no friction rub.   No murmur  heard. Pulmonary/Chest: Effort normal and breath sounds normal. No respiratory distress. She has no wheezes. She has no rales.  Abdominal: Soft. Bowel sounds are normal. She exhibits no distension and no mass. There is no tenderness. There is no rebound and no guarding.  Musculoskeletal: Normal range of motion. She exhibits no edema or tenderness.  Neurological: She is alert and oriented to person, place, and time.  Skin: Skin is warm and dry.  Psychiatric: She has a normal mood and affect.    ED Course  Procedures (including critical care time) Labs Review Labs Reviewed  CBC - Abnormal; Notable for the following:    WBC 18.1 (*)    HCT 35.9 (*)    All other components within normal limits  COMPREHENSIVE METABOLIC PANEL - Abnormal; Notable for the following:    Glucose, Bld 264 (*)    Anion gap 18 (*)    All other components within normal limits  CBG MONITORING, ED - Abnormal; Notable for the following:  Glucose-Capillary 252 (*)    All other components within normal limits  CBG MONITORING, ED - Abnormal; Notable for the following:    Glucose-Capillary 276 (*)    All other components within normal limits  CBG MONITORING, ED - Abnormal; Notable for the following:    Glucose-Capillary 320 (*)    All other components within normal limits  CBG MONITORING, ED - Abnormal; Notable for the following:    Glucose-Capillary 217 (*)    All other components within normal limits    Imaging Review Dg Chest 2 View  04/15/2014   CLINICAL DATA:  Cough for 3 weeks  EXAM: CHEST  2 VIEW  COMPARISON:  None.  FINDINGS: The heart size and mediastinal contours are stable. The aorta is tortuous. The lung volumes are low there is no focal infiltrate, pulmonary edema, or pleural effusion. The visualized skeletal structures are unremarkable.  IMPRESSION: No active cardiopulmonary disease.   Electronically Signed   By: Abelardo Diesel M.D.   On: 04/15/2014 21:48     EKG Interpretation None      MDM    Final diagnoses:  Cough  Hyperglycemia without ketosis   Pt is a 58 y.o. female with Pmhx as above who presents enterprise from PCP office after she had a point-of-care urine done in the office today with greater than 1000 glucose and positive ketones.  She states that she has had a cough for 1 month and had been taking a cough syrup with codeine, which caused her to be constipated.  She did have a bowel movement today after taking a laxative.  She denies fevers, chills or shortness of breath, no cough is worse when she lays flat because it causes a tickle in her throat.  On physical exam, patient mildly tachycardic, vital signs otherwise stable.  She appears in no acute distress.  Cardiopulmonary exam is benign.  Abdominal exam is benign.  Initial glucose 264 with anion gap of 18, however.  CO2 is normal.  White blood cell count is 18.1 and given continued cough.  Chest x-ray ordered, which was normal.  Patient given a total of 2 L normal saline as well as total of 10 units of regular insulin with improvement of glucose 217.  Do not feel the patient is in DKA, I see do not feel that she has pneumonia.  I think she is safe to be discharged home and can continue treatment of her cough with prescriptions written by PCP earlier this afternoon.     Mee Hives Mincer evaluation in the Emergency Department is complete. It has been determined that no acute conditions requiring further emergency intervention are present at this time. The patient/guardian have been advised of the diagnosis and plan. We have discussed signs and symptoms that warrant return to the ED, such as changes or worsening in symptoms, chest pain, trouble breathing, inability to tolerate liquids     Ernestina Patches, MD 04/15/14 2349

## 2014-04-15 NOTE — Progress Notes (Signed)
Per Dr. Joseph Art due to lab results pt is to report to ED as soon as possible. Spoke to [place] husband and advised. He stated they are on their way now.

## 2014-04-16 LAB — COMPREHENSIVE METABOLIC PANEL
ALT: 20 U/L (ref 0–35)
AST: 17 U/L (ref 0–37)
Albumin: 4.7 g/dL (ref 3.5–5.2)
Alkaline Phosphatase: 84 U/L (ref 39–117)
BUN: 15 mg/dL (ref 6–23)
CO2: 23 mEq/L (ref 19–32)
Calcium: 10.2 mg/dL (ref 8.4–10.5)
Chloride: 102 mEq/L (ref 96–112)
Creat: 0.87 mg/dL (ref 0.50–1.10)
Glucose, Bld: 195 mg/dL — ABNORMAL HIGH (ref 70–99)
Potassium: 3.8 mEq/L (ref 3.5–5.3)
Sodium: 141 mEq/L (ref 135–145)
Total Bilirubin: 0.6 mg/dL (ref 0.2–1.2)
Total Protein: 8 g/dL (ref 6.0–8.3)

## 2014-04-16 LAB — LIPID PANEL
Cholesterol: 157 mg/dL (ref 0–200)
HDL: 56 mg/dL (ref >39)
LDL Cholesterol: 88 mg/dL (ref 0–99)
Total CHOL/HDL Ratio: 2.8 Ratio
Triglycerides: 64 mg/dL (ref <150)
VLDL: 13 mg/dL (ref 0–40)

## 2014-04-16 LAB — MICROALBUMIN, URINE: Microalb, Ur: 4.7 mg/dL — ABNORMAL HIGH (ref <2.0)

## 2014-04-16 LAB — HEMOGLOBIN A1C
Hgb A1c MFr Bld: 10.5 % — ABNORMAL HIGH (ref <5.7)
Mean Plasma Glucose: 255 mg/dL — ABNORMAL HIGH (ref <117)

## 2014-04-21 ENCOUNTER — Other Ambulatory Visit: Payer: Self-pay

## 2014-04-21 DIAGNOSIS — E119 Type 2 diabetes mellitus without complications: Secondary | ICD-10-CM

## 2014-04-21 MED ORDER — CANAGLIFLOZIN 100 MG PO TABS: 100.0000 mg | ORAL_TABLET | Freq: Every day | ORAL | Status: DC

## 2014-04-26 ENCOUNTER — Telehealth: Payer: Self-pay

## 2014-04-26 DIAGNOSIS — E119 Type 2 diabetes mellitus without complications: Secondary | ICD-10-CM

## 2014-04-26 MED ORDER — CANAGLIFLOZIN 100 MG PO TABS: 100.0000 mg | ORAL_TABLET | Freq: Every day | ORAL | Status: DC

## 2014-04-26 NOTE — Telephone Encounter (Signed)
Sent in #30 day supply.  Lm to advise pt

## 2014-04-26 NOTE — Telephone Encounter (Signed)
Pt states we called in a 90 day supply for her Ivonkana,but she cannot afford that amount, Please call in 30 day supply instead  Best phone for pt is [phone]   [facility]

## 2014-04-30 ENCOUNTER — Encounter: Payer: Self-pay | Admitting: Family Medicine

## 2014-05-07 ENCOUNTER — Ambulatory Visit (INDEPENDENT_AMBULATORY_CARE_PROVIDER_SITE_OTHER): Payer: 59 | Admitting: Internal Medicine

## 2014-05-07 ENCOUNTER — Telehealth: Payer: Self-pay

## 2014-05-07 VITALS — BP 138/76 | HR 86 | Temp 98.0°F | Resp 18 | Ht 64.0 in | Wt 193.2 lb

## 2014-05-07 DIAGNOSIS — R059 Cough, unspecified: Secondary | ICD-10-CM

## 2014-05-07 DIAGNOSIS — T464X5A Adverse effect of angiotensin-converting-enzyme inhibitors, initial encounter: Secondary | ICD-10-CM

## 2014-05-07 DIAGNOSIS — Z23 Encounter for immunization: Secondary | ICD-10-CM

## 2014-05-07 DIAGNOSIS — R058 Other specified cough: Secondary | ICD-10-CM

## 2014-05-07 DIAGNOSIS — R05 Cough: Secondary | ICD-10-CM

## 2014-05-07 DIAGNOSIS — I10 Essential (primary) hypertension: Secondary | ICD-10-CM

## 2014-05-07 MED ORDER — LOSARTAN POTASSIUM-HCTZ 100-12.5 MG PO TABS: 1.0000 | ORAL_TABLET | Freq: Every day | ORAL | Status: DC

## 2014-05-07 NOTE — Telephone Encounter (Signed)
Discussed with Dr Laney Pastor, sent in

## 2014-05-07 NOTE — Telephone Encounter (Signed)
I think you wanted to send in Losartan , but not sent. Please advise

## 2014-05-07 NOTE — Progress Notes (Signed)
Subjective:  This chart was scribed for Tami Lin, MD by Dellis Filbert, ED Scribe at [facility].The patient was seen in exam room 10 and the patient's care was started at 12:13 PM.   Patient ID: Heidi Howell, female    DOB: [birth year], 57 y.o.   MRN: [id number] Chief Complaint  Patient presents with  . Cough    Persistent x2 months- productive. Keeping her up at night    HPI HPI Comments: Heidi Howell is a 59 y.o. female with a history of diabetes requiring insulin and Invokana, who presents to [facility] complaining of a chronic cough that has been ongoing for two months. Her cough is occas productive with phlegm. Pt describes the cough stimulous as a tickling in her throat. She has very occas sneezing and post nasal drip as associated symptoms. Laying down worsens her cough, but activity does not. She coughs more at home than at work. Pt does have occasional seasonal allergies. Pt was seen her by Dr. Federico Flake on 04/15/2014, he ordered a chest-xray, there were no abnormal findings. Pt is unsure of a Hx asthma or indigestion. She has been taking Levemir for her DM since the onset 3 mos ago and her husband believes this could the cause of her cough. She denies SOB, wheezing and leg swelling. They are unsure how long she's been on lisinopril.  No fever chills night sweats or weight loss.  She continues on Singulair Advair and antihistamines but without much effect on this cough( With nasal steroids)  Patient Active Problem List   Diagnosis Date Noted  . Adverse drug effect 02/12/2014  . Cortical cataract 08/17/2013  . Primary open angle glaucoma 08/17/2013  . No diabetic retinopathy in both eyes 08/17/2013  . Hyperopia 08/17/2013  . Astigmatism 08/17/2013  . Presbyopia 08/17/2013  . Type I (juvenile type) diabetes mellitus without mention of complication, not stated as uncontrolled 07/15/2013  . Obesity 08/20/2011  . Symptomatic menopausal or female climacteric  states 08/20/2011  . Insomnia 08/20/2011  . Fibroids 08/20/2011   Past Medical History  Diagnosis Date  . Yeast infection   . Anemia   . H/O fatigue   . Fibroids   . H/O menorrhagia 2001  . Cyst   . Bartholin gland cyst 06/04/2000  . H/O dysmenorrhea 06/04/2000  . Menopausal symptoms   . Diabetes mellitus   . Infertility, female   . Hypertension   . H/O: obesity   . Varicose vein   . Glaucoma   . FH: colon cancer    Past Surgical History  Procedure Laterality Date  . Dilation and curettage of uterus  1989  . Bartholin gland cyst excision  2010  . Cesarean section  [id number]  . Tubal ligation  1996    bilateral   No Known Allergies Prior to Admission medications   Medication Sig Start Date End Date Taking? Authorizing Provider  ADVAIR DISKUS 100-50 MCG/DOSE AEPB INHALE 1 PUFF INTO THE LUNGS TWICE A DAY 01/26/14  Yes Mancel Bale, PA-C  aspirin 81 MG tablet Take 81 mg by mouth daily.   Yes Historical Provider, MD  canagliflozin (INVOKANA) 100 MG TABS tablet Take 1 tablet (100 mg total) by mouth daily. 04/26/14  Yes Chelle S Jeffery, PA-C  cyclobenzaprine (FLEXERIL) 5 MG tablet TAKE 1 TABLET TWICE A DAY AS NEEDED FOR MUSCLE SPASMS 01/20/14  Yes [name], MD  estrogens, conjugated, (PREMARIN) 0.3 MG tablet Take 0.3 mg by mouth daily. Take  daily for 21 days then do not take for 7 days.   Yes Historical Provider, MD  Insulin Detemir (LEVEMIR) 100 UNIT/ML Pen Inject 100 Units into the skin every morning. And pen needles 1/day Patient taking differently: Inject 40 Units into the skin every morning. And pen needles 1/day 02/12/14  Yes Renato Shin, MD  lisinopril-hydrochlorothiazide (ZESTORETIC) 20-12.5 MG per tablet Take 2 tablets by mouth daily. 10/15/13  Yes [name], MD  medroxyPROGESTERone (PROVERA) 5 MG tablet Take 5 mg by mouth daily.   Yes Historical Provider, MD  metFORMIN (GLUCOPHAGE) 1000 MG tablet Take 1 tablet (1,000 mg total) by mouth at bedtime. 01/14/14  Yes  [name], MD  metoprolol succinate (TOPROL-XL) 100 MG 24 hr tablet TAKE 1 TABLET DAILY WITH OR IMMEDIATELY FOLLOWING A MEAL 03/11/14  Yes [name], MD  montelukast (SINGULAIR) 10 MG tablet Take 1 tablet (10 mg total) by mouth at bedtime. 03/04/14  Yes [name], MD  Multiple Vitamin (MULTIVITAMIN) tablet Take 1 tablet by mouth daily.   Yes Historical Provider, MD  rOPINIRole (REQUIP) 2 MG tablet TAKE ONE-HALF TABLET (1 MG) AT BEDTIME 01/26/14  Yes Sarah Alleen Borne, PA-C  Travoprost, BAK Free, (TRAVATAN) 0.004 % SOLN ophthalmic solution Place 1 drop into both eyes at bedtime.    Yes Historical Provider, MD  zolpidem (AMBIEN) 10 MG tablet Take 1 tablet (10 mg total) by mouth at bedtime as needed for sleep. 04/28/13 05/07/14 Yes [name], MD  benzonatate (TESSALON) 200 MG capsule Take 1 capsule (200 mg total) by mouth 3 (three) times daily as needed for cough. Patient not taking: Reported on 05/07/2014 04/15/14   [name], MD  Fluticasone-Salmeterol (ADVAIR) 100-50 MCG/DOSE AEPB Inhale 1 puff into the lungs 2 (two) times daily. Patient not taking: Reported on 05/07/2014 02/12/14   [name], MD    Review of Systems  Constitutional: Negative for fever, activity change, fatigue and unexpected weight change.  HENT: Negative for nosebleeds, trouble swallowing and voice change.   Respiratory: Negative for chest tightness, shortness of breath and wheezing.   Cardiovascular: Negative for chest pain, palpitations and leg swelling.  Allergic/Immunologic: Positive for environmental allergies.  Neurological: Negative for headaches.      Objective:  Blood pressure 138/76, pulse 86, temperature 98 F (36.7 C), temperature source Oral, resp. rate 18, height 5\' 4"  (1.626 m), weight 193 lb 3.2 oz (87.635 kg), last menstrual period 01/15/2011, SpO2 99 %.  Physical Exam  Constitutional: She is oriented to person, place, and time. She appears well-developed and well-nourished. No  distress.  HENT:  Head: Normocephalic.  Right Ear: External ear normal.  Left Ear: External ear normal.  Nose: Nose normal.  Mouth/Throat: Oropharynx is clear and moist.  Boggy turbinates.  Eyes: Conjunctivae and EOM are normal. Pupils are equal, round, and reactive to light.  Neck: Normal range of motion. Neck supple. No thyromegaly present.  Cardiovascular: Normal rate, regular rhythm, normal heart sounds and intact distal pulses.   No murmur heard. Pulmonary/Chest: Effort normal and breath sounds normal. She has no wheezes. She has no rales.  Musculoskeletal: Normal range of motion. She exhibits no edema.  Lymphadenopathy:    She has no cervical adenopathy.  Neurological: She is alert and oriented to person, place, and time.  Skin: Skin is warm and dry. No rash noted.  Psychiatric: She has a normal mood and affect. Her behavior is normal. Judgment and thought content normal.  Nursing note and vitals reviewed. BP 138/76 mmHg  Pulse  86  Temp(Src) 98 F (36.7 C) (Oral)  Resp 18  Ht 5\' 4"  (1.626 m)  Wt 193 lb 3.2 oz (87.635 kg)  BMI 33.15 kg/m2  SpO2 99%  LMP 01/15/2011 Review of chest x-ray had no abnormalities     Assessment & Plan:  Cough  ACE-inhibitor cough  Essential hypertension  Discontinue lisinopril and replace with ARB Follow-up one month if not resolved   Meds ordered this encounter  Medications  . losartan-hydrochlorothiazide (HYZAAR) 100-12.5 MG per tablet    Sig: Take 1 tablet by mouth daily.    Dispense:  90 tablet    Refill:  3        I have completed the patient encounter in its entirety as documented by the scribe, with editing by me where necessary. Robert P. Laney Pastor, M.D.

## 2014-05-07 NOTE — Telephone Encounter (Signed)
Patient was seen today and stated Dr Laney Pastor was going to send a new BP  Medication to [facility] at [facility] for her. Patient went to the pharmacy and they didn't receive anything from our office. Patients call back number is [phone]

## 2014-05-13 ENCOUNTER — Ambulatory Visit: Payer: 59 | Admitting: *Deleted

## 2014-05-14 ENCOUNTER — Ambulatory Visit (INDEPENDENT_AMBULATORY_CARE_PROVIDER_SITE_OTHER): Payer: 59 | Admitting: Endocrinology

## 2014-05-14 ENCOUNTER — Encounter: Payer: Self-pay | Admitting: Endocrinology

## 2014-05-14 VITALS — BP 140/90 | HR 89 | Temp 99.0°F | Ht 64.0 in | Wt 193.0 lb

## 2014-05-14 DIAGNOSIS — E109 Type 1 diabetes mellitus without complications: Secondary | ICD-10-CM

## 2014-05-14 NOTE — Progress Notes (Signed)
Subjective:    Patient ID: Heidi Howell, female    DOB: [birth year], 57 y.o.   MRN: [id number]  HPI Pt returns for f/u of diabetes mellitus: DM type: Insulin-requiring type 2 Dx'ed: 4332 Complications: none Therapy: insulin since 2014 GDM: never DKA: never Severe hypoglycemia: never Pancreatitis: never Other: she is pursuing weight-loss surgery; she chose a qd insulin schedule Interval history: she is still working towards weight-loss surgery.  She reduced levemir to 40 units qd, due to excessive appetite.  She also takes invokana.  no cbg record, but states cbg's vary from 110-230.  There is no trend throughout the day.   Past Medical History  Diagnosis Date  . Yeast infection   . Anemia   . H/O fatigue   . Fibroids   . H/O menorrhagia 2001  . Cyst   . Bartholin gland cyst 06/04/2000  . H/O dysmenorrhea 06/04/2000  . Menopausal symptoms   . Diabetes mellitus   . Infertility, female   . Hypertension   . H/O: obesity   . Varicose vein   . Glaucoma   . FH: colon cancer     Past Surgical History  Procedure Laterality Date  . Dilation and curettage of uterus  1989  . Bartholin gland cyst excision  2010  . Cesarean section  [id number]  . Tubal ligation  1996    bilateral    History   Social History  . Marital Status: Married    Spouse Name: Madelaine Etienne    Number of Children: N/A  . Years of Education: N/A   Occupational History  . Not on file.   Social History Main Topics  . Smoking status: Never Smoker   . Smokeless tobacco: Never Used  . Alcohol Use: No  . Drug Use: No  . Sexual Activity:    Partners: Male     Comment: number of sex partners in the last 20 months 1   Other Topics Concern  . Not on file   Social History Narrative   Exercise walk 2-3 times for 30-40 minutes    Current Outpatient Prescriptions on File Prior to Visit  Medication Sig Dispense Refill  . ADVAIR DISKUS 100-50 MCG/DOSE AEPB INHALE 1 PUFF INTO THE LUNGS TWICE A DAY 1  each 2  . AFREZZA 4 UNITS POWD     . aspirin 81 MG tablet Take 81 mg by mouth daily.    . benzonatate (TESSALON) 200 MG capsule Take 1 capsule (200 mg total) by mouth 3 (three) times daily as needed for cough. 20 capsule 0  . canagliflozin (INVOKANA) 100 MG TABS tablet Take 1 tablet (100 mg total) by mouth daily. 30 tablet 5  . cyclobenzaprine (FLEXERIL) 5 MG tablet TAKE 1 TABLET TWICE A DAY AS NEEDED FOR MUSCLE SPASMS 180 tablet 0  . estrogens, conjugated, (PREMARIN) 0.3 MG tablet Take 0.3 mg by mouth daily. Take daily for 21 days then do not take for 7 days.    . Fluticasone-Salmeterol (ADVAIR) 100-50 MCG/DOSE AEPB Inhale 1 puff into the lungs 2 (two) times daily. 3 each 0  . Insulin Detemir (LEVEMIR) 100 UNIT/ML Pen Inject 100 Units into the skin every morning. And pen needles 1/day 105 mL 3  . losartan-hydrochlorothiazide (HYZAAR) 100-12.5 MG per tablet Take 1 tablet by mouth daily. 90 tablet 3  . medroxyPROGESTERone (PROVERA) 5 MG tablet Take 5 mg by mouth daily.    . metFORMIN (GLUCOPHAGE) 1000 MG tablet Take 1 tablet (1,000 mg total) by mouth  at bedtime. 90 tablet 3  . metoprolol succinate (TOPROL-XL) 100 MG 24 hr tablet TAKE 1 TABLET DAILY WITH OR IMMEDIATELY FOLLOWING A MEAL 90 tablet 0  . montelukast (SINGULAIR) 10 MG tablet Take 1 tablet (10 mg total) by mouth at bedtime. 90 tablet 3  . Multiple Vitamin (MULTIVITAMIN) tablet Take 1 tablet by mouth daily.    . OSPHENA 60 MG TABS     . rOPINIRole (REQUIP) 2 MG tablet TAKE ONE-HALF TABLET (1 MG) AT BEDTIME 30 tablet 4  . Travoprost, BAK Free, (TRAVATAN) 0.004 % SOLN ophthalmic solution Place 1 drop into both eyes at bedtime.     Marland Kitchen zolpidem (AMBIEN) 10 MG tablet Take 1 tablet (10 mg total) by mouth at bedtime as needed for sleep. 90 tablet 1   Current Facility-Administered Medications on File Prior to Visit  Medication Dose Route Frequency Provider Last Rate Last Dose  . pneumococcal 13-valent conjugate vaccine (PREVNAR 13) injection 0.5  mL  0.5 mL Intramuscular Tomorrow-1000 [name], MD        Allergies  Allergen Reactions  . Ace Inhibitors Cough    Family History  Problem Relation Age of Onset  . Cancer Father     colon  . Cancer Mother     per patient stomach  . Diabetes Maternal Grandmother   . Mental illness Sister   . Heart disease Brother     BP 140/90 mmHg  Pulse 89  Temp(Src) 99 F (37.2 C) (Oral)  Ht 5\' 4"  (1.626 m)  Wt 193 lb (87.544 kg)  BMI 33.11 kg/m2  SpO2 95%  LMP 01/15/2011   Review of Systems She denies hypoglycemia and weight change.    Objective:   Physical Exam VITAL SIGNS:  See vs page GENERAL: no distress Pulses: dorsalis pedis intact bilat.   MSK: no deformity of the feet CV: no leg edema Skin:  no ulcer on the feet.  normal color and temp on the feet. Neuro: sensation is intact to touch on the feet.    Lab Results  Component Value Date   HGBA1C 10.5* 04/15/2014       Assessment & Plan:  Obesity, unchanged.  She wants to continue the invokana--ok with me.   DM: severe exacerbation  Patient is advised the following: Patient Instructions  check your blood sugar twice a day.  vary the time of day when you check, between before the 3 meals, and at bedtime.  also check if you have symptoms of your blood sugar being too high or too low.  please keep a record of the readings and bring it to your next appointment here.  You can write it on any piece of paper.  please call us sooner if your blood sugar goes below 70, or if you have a lot of readings over 200.    Please come back for a follow-up appointment in 2 months.   Please continue to pursue the weight-loss surgery.    Please increase the insulin bask to 100 units each morning.

## 2014-05-14 NOTE — Patient Instructions (Addendum)
check your blood sugar twice a day.  vary the time of day when you check, between before the 3 meals, and at bedtime.  also check if you have symptoms of your blood sugar being too high or too low.  please keep a record of the readings and bring it to your next appointment here.  You can write it on any piece of paper.  please call us sooner if your blood sugar goes below 70, or if you have a lot of readings over 200.    Please come back for a follow-up appointment in 2 months.   Please continue to pursue the weight-loss surgery.    Please increase the insulin bask to 100 units each morning.

## 2014-05-31 ENCOUNTER — Other Ambulatory Visit: Payer: Self-pay

## 2014-05-31 MED ORDER — LOSARTAN POTASSIUM-HCTZ 100-12.5 MG PO TABS
1.0000 | ORAL_TABLET | Freq: Every day | ORAL | Status: DC
Start: 2014-05-31 — End: 2014-07-15

## 2014-06-15 ENCOUNTER — Telehealth: Payer: Self-pay

## 2014-06-15 NOTE — Telephone Encounter (Signed)
Pt stopped in and dropped off form for Dr Joseph Art to fill out. Please fax to [phone] when complete and call pt to pick up a copy of completed forms.The forms were placed in nurses box at [place]. Pt's [phone]. Thank you

## 2014-06-15 NOTE — Telephone Encounter (Signed)
Placed in Dr. Carlean Jews box.

## 2014-06-28 ENCOUNTER — Telehealth: Payer: Self-pay | Admitting: Family Medicine

## 2014-06-28 NOTE — Telephone Encounter (Signed)
Patient is requesting the letter of medical necessity - for a meeting with surgeon on this Wednesday.   Okay to wait until Friday.    [phone]

## 2014-06-30 NOTE — Telephone Encounter (Signed)
Dr L, is it OK to RF flexeril? You just saw pt, but I don't see this discussed. I have form and will complete as much as I can and then put back in Dr Lenn Cal box.

## 2014-07-05 NOTE — Telephone Encounter (Signed)
Flexeril was refilled.

## 2014-07-08 ENCOUNTER — Telehealth: Payer: Self-pay

## 2014-07-08 NOTE — Telephone Encounter (Signed)
Called pt concerning a [facility] form for weight loss surgery. We need to have documented attempts at weight loss and asked pt to call or fax me this info. Pt stated she will fax it to me so that I can complete the form. This may have already been done (see notes under 06/28/14 message), but pt did not know if [facility] has this already or not so I will send it when completed.

## 2014-07-12 NOTE — Telephone Encounter (Signed)
Pt sent info about weight loss attempts and I completed form. Put in Dr Lenn Cal box for signature.

## 2014-07-13 ENCOUNTER — Encounter: Payer: Self-pay | Admitting: Endocrinology

## 2014-07-13 ENCOUNTER — Ambulatory Visit (INDEPENDENT_AMBULATORY_CARE_PROVIDER_SITE_OTHER): Payer: 59 | Admitting: Endocrinology

## 2014-07-13 ENCOUNTER — Encounter: Payer: Self-pay | Admitting: Family Medicine

## 2014-07-13 VITALS — BP 168/80 | HR 68 | Temp 98.2°F | Wt 197.4 lb

## 2014-07-13 DIAGNOSIS — E109 Type 1 diabetes mellitus without complications: Secondary | ICD-10-CM

## 2014-07-13 NOTE — Progress Notes (Signed)
Subjective:    Patient ID: Heidi Howell, female    DOB: 1955/11/18, 59 y.o.   MRN: [id number]  HPI Pt returns for f/u of diabetes mellitus: DM type: Insulin-requiring type 2 Dx'ed: 4315 Complications: none Therapy: insulin since 2014, and invokana GDM: never DKA: never Severe hypoglycemia: never Pancreatitis: never Other: she is pursuing weight-loss surgery; she chose a qd insulin schedule Interval history: she is still working towards weight-loss surgery.  no cbg record, but states cbg's vary from 50-170.  It is in general higher as the day goes on.  Past Medical History  Diagnosis Date  . Yeast infection   . Anemia   . H/O fatigue   . Fibroids   . H/O menorrhagia 2001  . Cyst   . Bartholin gland cyst 06/04/2000  . H/O dysmenorrhea 06/04/2000  . Menopausal symptoms   . Diabetes mellitus   . Infertility, female   . Hypertension   . H/O: obesity   . Varicose vein   . Glaucoma   . FH: colon cancer     Past Surgical History  Procedure Laterality Date  . Dilation and curettage of uterus  1989  . Bartholin gland cyst excision  2010  . Cesarean section  [id number]  . Tubal ligation  1996    bilateral    History   Social History  . Marital Status: Married    Spouse Name: Madelaine Etienne  . Number of Children: N/A  . Years of Education: N/A   Occupational History  . Not on file.   Social History Main Topics  . Smoking status: Never Smoker   . Smokeless tobacco: Never Used  . Alcohol Use: No  . Drug Use: No  . Sexual Activity:    Partners: Male     Comment: number of sex partners in the last 26 months 1   Other Topics Concern  . Not on file   Social History Narrative   Exercise walk 2-3 times for 30-40 minutes    Current Outpatient Prescriptions on File Prior to Visit  Medication Sig Dispense Refill  . ADVAIR DISKUS 100-50 MCG/DOSE AEPB INHALE 1 PUFF INTO THE LUNGS TWICE A DAY 1 each 2  . aspirin 81 MG tablet Take 81 mg by mouth daily.    .  canagliflozin (INVOKANA) 100 MG TABS tablet Take 1 tablet (100 mg total) by mouth daily. 30 tablet 5  . cyclobenzaprine (FLEXERIL) 5 MG tablet TAKE 1 TABLET TWICE A DAY AS NEEDED FOR MUSCLE SPASM 180 tablet 1  . estrogens, conjugated, (PREMARIN) 0.3 MG tablet Take 0.3 mg by mouth daily. Take daily for 21 days then do not take for 7 days.    . Fluticasone-Salmeterol (ADVAIR) 100-50 MCG/DOSE AEPB Inhale 1 puff into the lungs 2 (two) times daily. 3 each 0  . Insulin Detemir (LEVEMIR) 100 UNIT/ML Pen Inject 100 Units into the skin every morning. And pen needles 1/day 105 mL 3  . losartan-hydrochlorothiazide (HYZAAR) 100-12.5 MG per tablet Take 1 tablet by mouth daily. 90 tablet 3  . medroxyPROGESTERone (PROVERA) 5 MG tablet Take 5 mg by mouth daily.    . metFORMIN (GLUCOPHAGE) 1000 MG tablet Take 1 tablet (1,000 mg total) by mouth at bedtime. 90 tablet 3  . metoprolol succinate (TOPROL-XL) 100 MG 24 hr tablet TAKE 1 TABLET DAILY WITH/OR IMMEDIATELY FOLLOWING A MEAL 90 tablet 1  . montelukast (SINGULAIR) 10 MG tablet Take 1 tablet (10 mg total) by mouth at bedtime. 90 tablet 3  .  Multiple Vitamin (MULTIVITAMIN) tablet Take 1 tablet by mouth daily.    . OSPHENA 60 MG TABS     . rOPINIRole (REQUIP) 2 MG tablet TAKE ONE-HALF TABLET (1 MG) AT BEDTIME 30 tablet 4  . Travoprost, BAK Free, (TRAVATAN) 0.004 % SOLN ophthalmic solution Place 1 drop into both eyes at bedtime.     Marland Kitchen zolpidem (AMBIEN) 10 MG tablet Take 1 tablet (10 mg total) by mouth at bedtime as needed for sleep. 90 tablet 1   Current Facility-Administered Medications on File Prior to Visit  Medication Dose Route Frequency Provider Last Rate Last Dose  . pneumococcal 13-valent conjugate vaccine (PREVNAR 13) injection 0.5 mL  0.5 mL Intramuscular Tomorrow-1000 [name], MD        Allergies  Allergen Reactions  . Ace Inhibitors Cough    Family History  Problem Relation Age of Onset  . Cancer Father     colon  . Cancer Mother      per patient stomach  . Diabetes Maternal Grandmother   . Mental illness Sister   . Heart disease Brother     BP 168/80 mmHg  Pulse 68  Temp(Src) 98.2 F (36.8 C) (Oral)  Wt 197 lb 6.4 oz (89.54 kg)  LMP 01/15/2011  Review of Systems Denies LOC, but she has gained weight.      Objective:   Physical Exam VITAL SIGNS:  See vs page GENERAL: no distress. Pulses: dorsalis pedis intact bilat.   MSK: no deformity of the feet CV: no leg edema Skin:  no ulcer on the feet.  normal color and temp on the feet. Neuro: sensation is intact to touch on the feet.    Lab Results  Component Value Date   HGBA1C 10.5* 04/15/2014       Assessment & Plan:  DM: she needs increased rx HTN: control is improved. Obesity: persistent.    Patient is advised the following: Patient Instructions  check your blood sugar twice a day.  vary the time of day when you check, between before the 3 meals, and at bedtime.  also check if you have symptoms of your blood sugar being too high or too low.  please keep a record of the readings and bring it to your next appointment here.  You can write it on any piece of paper.  please call us sooner if your blood sugar goes below 70, or if you have a lot of readings over 200.    Please come back for a follow-up appointment in 2-3 months.   Please continue to pursue the weight-loss surgery.    blood tests are being requested for you today.  We'll let you know about the results. Please see Dr Joseph Art as scheduled, for your blood pressure.   Please try taking your insulin a little later, to avoid it going low before lunch.

## 2014-07-13 NOTE — Patient Instructions (Addendum)
check your blood sugar twice a day.  vary the time of day when you check, between before the 3 meals, and at bedtime.  also check if you have symptoms of your blood sugar being too high or too low.  please keep a record of the readings and bring it to your next appointment here.  You can write it on any piece of paper.  please call us sooner if your blood sugar goes below 70, or if you have a lot of readings over 200.    Please come back for a follow-up appointment in 2-3 months.   Please continue to pursue the weight-loss surgery.    blood tests are being requested for you today.  We'll let you know about the results. Please see Dr Joseph Art as scheduled, for your blood pressure.   Please try taking your insulin a little later, to avoid it going low before lunch.

## 2014-07-13 NOTE — Telephone Encounter (Signed)
Dr L, sorry to have to send this back to you, but I noticed at bottom of form instr's say that [facility] also needs you to write a letter of medical necessity for pt's bariatric surgery. Can you please write this or let me know what you want it to say and I will print out and fax along w/form. Thank you.

## 2014-07-15 ENCOUNTER — Other Ambulatory Visit: Payer: Self-pay | Admitting: *Deleted

## 2014-07-15 ENCOUNTER — Ambulatory Visit (INDEPENDENT_AMBULATORY_CARE_PROVIDER_SITE_OTHER): Payer: 59 | Admitting: Family Medicine

## 2014-07-15 ENCOUNTER — Encounter: Payer: Self-pay | Admitting: Family Medicine

## 2014-07-15 ENCOUNTER — Telehealth: Payer: Self-pay | Admitting: *Deleted

## 2014-07-15 VITALS — BP 174/77 | HR 67 | Temp 98.0°F | Resp 16 | Ht 64.5 in | Wt 194.0 lb

## 2014-07-15 DIAGNOSIS — E119 Type 2 diabetes mellitus without complications: Secondary | ICD-10-CM

## 2014-07-15 DIAGNOSIS — N3000 Acute cystitis without hematuria: Secondary | ICD-10-CM

## 2014-07-15 DIAGNOSIS — I10 Essential (primary) hypertension: Secondary | ICD-10-CM

## 2014-07-15 LAB — COMPLETE METABOLIC PANEL WITH GFR
ALT: 23 U/L (ref 0–35)
AST: 18 U/L (ref 0–37)
Albumin: 4 g/dL (ref 3.5–5.2)
Alkaline Phosphatase: 84 U/L (ref 39–117)
BUN: 14 mg/dL (ref 6–23)
CO2: 24 mEq/L (ref 19–32)
Calcium: 9.4 mg/dL (ref 8.4–10.5)
Chloride: 105 mEq/L (ref 96–112)
Creat: 0.66 mg/dL (ref 0.50–1.10)
GFR, Est African American: >89 mL/min
GFR, Est Non African American: >89 mL/min
Glucose, Bld: 66 mg/dL — ABNORMAL LOW (ref 70–99)
Potassium: 3.9 mEq/L (ref 3.5–5.3)
Sodium: 140 mEq/L (ref 135–145)
Total Bilirubin: 0.5 mg/dL (ref 0.2–1.2)
Total Protein: 7.1 g/dL (ref 6.0–8.3)

## 2014-07-15 LAB — POCT URINALYSIS DIPSTICK
Bilirubin, UA: NEGATIVE
Glucose, UA: >=1000
Ketones, UA: NEGATIVE
Nitrite, UA: NEGATIVE
Protein, UA: NEGATIVE
Spec Grav, UA: 1.02
Urobilinogen, UA: 0.2
pH, UA: 5.5

## 2014-07-15 LAB — POCT GLYCOSYLATED HEMOGLOBIN (HGB A1C): Hemoglobin A1C: 8.1

## 2014-07-15 MED ORDER — LISINOPRIL-HYDROCHLOROTHIAZIDE 20-12.5 MG PO TABS: 1.0000 | ORAL_TABLET | Freq: Every day | ORAL | Status: DC

## 2014-07-15 MED ORDER — CIPROFLOXACIN HCL 250 MG PO TABS: 250.0000 mg | ORAL_TABLET | Freq: Two times a day (BID) | ORAL | Status: DC

## 2014-07-15 MED ORDER — CIPROFLOXACIN HCL 250 MG PO TABS
250.0000 mg | ORAL_TABLET | Freq: Two times a day (BID) | ORAL | Status: DC
Start: 2014-07-15 — End: 2014-07-15

## 2014-07-15 NOTE — Addendum Note (Signed)
Addended by: Burnis Kingfisher on: 07/15/2014 07:32 PM   Modules accepted: Orders

## 2014-07-15 NOTE — Telephone Encounter (Signed)
Pt called and stated she was seen today by Dr. Carlean Jews and her antibiotic was not at the pharmacy.  Upon checking her medication of Cipro 250mg  was sent by [facility].  Pt advised that it was suppose to go to [facility].    A new order was sent over to the correct pharmacy.    Pt was advised not to take the medication that will be sent by [facility].  Pt understood

## 2014-07-15 NOTE — Telephone Encounter (Signed)
Letter done and both it and form faxed to [facility] w/confirmation. Pt was at 104 for appt and I gave her copies of both for her records. Put forms to be scanned.

## 2014-07-15 NOTE — Patient Instructions (Signed)

## 2014-07-15 NOTE — Progress Notes (Addendum)
Subjective:  This chart was scribed for [name], MD by Randa Evens, ED Scribe. This Patient was seen in room 26 and the patients care was started at 3:05 PM   Patient ID: Heidi Howell, female    DOB: 08-06-1955, 59 y.o.   MRN: [id number]  Chief Complaint  Patient presents with  . Follow-up  . Diabetes    HPI HPI Comments: Heidi Howell is a 59 y.o. female with PMHx Dm and HTN who presents to the [facility] for follow up for diabetes and cough. Pt states that the cough has resolved. Pt states the cough was due to a change in her hypertension medication. Pt states that after stopping the medication the cough resolved. Pt states that her diabetes has been well controlled recently. Pt states that she would like her A1C checked today. Pt states that she still plans on going through with the bariatric surgery. Pt states she has tried diet change, medication changes, and exercise with no relief of her diabetes and HTN symptoms. Pt states she has had diabetes since 2005. Pt denies loss of sensation or other related symptoms.  Patient saw Dr. Benjie Karvonen for pap and pelvic last week.  Tests including TSH were normal  BP during office visit 150/80  Past Medical History  Diagnosis Date  . Yeast infection   . Anemia   . H/O fatigue   . Fibroids   . H/O menorrhagia 2001  . Cyst   . Bartholin gland cyst 06/04/2000  . H/O dysmenorrhea 06/04/2000  . Menopausal symptoms   . Diabetes mellitus   . Infertility, female   . Hypertension   . H/O: obesity   . Varicose vein   . Glaucoma   . FH: colon cancer    Prior to Admission medications   Medication Sig Start Date End Date Taking? Authorizing Provider  ADVAIR DISKUS 100-50 MCG/DOSE AEPB INHALE 1 PUFF INTO THE LUNGS TWICE A DAY 01/26/14  Yes Mancel Bale, PA-C  aspirin 81 MG tablet Take 81 mg by mouth daily.   Yes Historical Provider, MD  canagliflozin (INVOKANA) 100 MG TABS tablet Take 1 tablet (100 mg total) by mouth  daily. 04/26/14  Yes Chelle S Jeffery, PA-C  cyclobenzaprine (FLEXERIL) 5 MG tablet TAKE 1 TABLET TWICE A DAY AS NEEDED FOR MUSCLE SPASM 07/02/14  Yes [name], MD  estrogens, conjugated, (PREMARIN) 0.3 MG tablet Take 0.3 mg by mouth daily. Take daily for 21 days then do not take for 7 days.   Yes Historical Provider, MD  Fluticasone-Salmeterol (ADVAIR) 100-50 MCG/DOSE AEPB Inhale 1 puff into the lungs 2 (two) times daily. 02/12/14  Yes [name], MD  Insulin Detemir (LEVEMIR) 100 UNIT/ML Pen Inject 100 Units into the skin every morning. And pen needles 1/day 02/12/14  Yes Renato Shin, MD  losartan-hydrochlorothiazide (HYZAAR) 100-12.5 MG per tablet Take 1 tablet by mouth daily. 05/31/14  Yes [name], MD  medroxyPROGESTERone (PROVERA) 5 MG tablet Take 5 mg by mouth daily.   Yes Historical Provider, MD  metFORMIN (GLUCOPHAGE) 1000 MG tablet Take 1 tablet (1,000 mg total) by mouth at bedtime. 01/14/14  Yes [name], MD  metoprolol succinate (TOPROL-XL) 100 MG 24 hr tablet TAKE 1 TABLET DAILY WITH/OR IMMEDIATELY FOLLOWING A MEAL 06/30/14  Yes [name], MD  montelukast (SINGULAIR) 10 MG tablet Take 1 tablet (10 mg total) by mouth at bedtime. 03/04/14  Yes [name], MD  Multiple Vitamin (MULTIVITAMIN) tablet Take 1 tablet  by mouth daily.   Yes Historical Provider, MD  OSPHENA 60 MG TABS  03/10/14  Yes Historical Provider, MD  Travoprost, BAK Free, (TRAVATAN) 0.004 % SOLN ophthalmic solution Place 1 drop into both eyes at bedtime.    Yes Historical Provider, MD  rOPINIRole (REQUIP) 2 MG tablet TAKE ONE-HALF TABLET (1 MG) AT BEDTIME Patient not taking: Reported on 07/15/2014 01/26/14   Mancel Bale, PA-C  zolpidem (AMBIEN) 10 MG tablet Take 1 tablet (10 mg total) by mouth at bedtime as needed for sleep. 04/28/13 05/07/14  [name], MD    Review of Systems  Constitutional: Negative for fever.  Neurological: Negative for numbness.  All other systems reviewed and  are negative.    Objective:   BP 174/77 mmHg  Pulse 67  Temp(Src) 98 F (36.7 C)  Resp 16  Ht 5' 4.5" (1.638 m)  Wt 194 lb (87.998 kg)  BMI 32.80 kg/m2  SpO2 100%  LMP 01/15/2011   Physical Exam  Constitutional: She is oriented to person, place, and time. She appears well-developed and well-nourished. No distress.  HENT:  Head: Normocephalic and atraumatic.  Eyes: Conjunctivae and EOM are normal.  Neck: Neck supple. No tracheal deviation present.  Cardiovascular: Normal rate.   Pulmonary/Chest: Effort normal. No respiratory distress.  Musculoskeletal: Normal range of motion.  Neurological: She is alert and oriented to person, place, and time.  Skin: Skin is warm and dry.  Psychiatric: She has a normal mood and affect. Her behavior is normal.  Nursing note and vitals reviewed.  EKG normal  Assessment & Plan:   This chart was scribed in my presence and reviewed by me personally.    ICD-9-CM ICD-10-CM   1. Type 2 diabetes mellitus without complication 956.21 H08.6 POCT glycosylated hemoglobin (Hb A1C)     POCT urinalysis dipstick     Microalbumin, urine     COMPLETE METABOLIC PANEL WITH GFR  2. Acute cystitis without hematuria 595.0 N30.00 ciprofloxacin (CIPRO) 250 MG tablet  3. Essential hypertension 401.9 I10 lisinopril-hydrochlorothiazide (ZESTORETIC) 20-12.5 MG per tablet     Signed, [name], MD

## 2014-07-16 LAB — MICROALBUMIN, URINE: Microalb, Ur: 1.4 mg/dL (ref <2.0)

## 2014-07-19 LAB — HM DIABETES EYE EXAM

## 2014-07-29 ENCOUNTER — Other Ambulatory Visit: Payer: Self-pay | Admitting: General Surgery

## 2014-07-29 NOTE — Addendum Note (Signed)
Addended by: Redmond Pulling, Maria Coin M on: 07/29/2014 11:17 AM   Modules accepted: Orders

## 2014-07-29 NOTE — Addendum Note (Signed)
Addended by: Redmond Pulling, Ellisha Bankson M on: 07/29/2014 11:22 AM   Modules accepted: Orders

## 2014-08-08 ENCOUNTER — Telehealth: Payer: Self-pay | Admitting: Endocrinology

## 2014-08-08 ENCOUNTER — Encounter: Payer: Self-pay | Admitting: Endocrinology

## 2014-08-08 NOTE — Telephone Encounter (Signed)
please call patient: In order to do letter, i need to know what weight loss efforts she has undertaken.

## 2014-08-09 ENCOUNTER — Telehealth: Payer: Self-pay | Admitting: Endocrinology

## 2014-08-09 ENCOUNTER — Other Ambulatory Visit: Payer: Self-pay | Admitting: Family Medicine

## 2014-08-09 NOTE — Telephone Encounter (Signed)
Patient is returning your call.  

## 2014-08-09 NOTE — Telephone Encounter (Signed)
Left voicemail advising pt to call back and let us know what weight loss efforts she has tried.

## 2014-08-10 ENCOUNTER — Other Ambulatory Visit: Payer: Self-pay

## 2014-08-10 NOTE — Telephone Encounter (Signed)
Left voicemail requesting call back from pt. Pt was advised via voicemail to leave what weight loss efforts she has tried with the receptionist.

## 2014-08-12 ENCOUNTER — Encounter: Payer: Self-pay | Admitting: Endocrinology

## 2014-08-12 ENCOUNTER — Encounter: Payer: Self-pay | Admitting: *Deleted

## 2014-08-17 ENCOUNTER — Other Ambulatory Visit: Payer: Self-pay | Admitting: General Surgery

## 2014-09-01 ENCOUNTER — Encounter: Payer: 59 | Attending: General Surgery | Admitting: Dietician

## 2014-09-01 ENCOUNTER — Encounter: Payer: Self-pay | Admitting: Dietician

## 2014-09-01 VITALS — Ht 64.0 in | Wt 196.3 lb

## 2014-09-01 DIAGNOSIS — E669 Obesity, unspecified: Secondary | ICD-10-CM

## 2014-09-01 DIAGNOSIS — Z713 Dietary counseling and surveillance: Secondary | ICD-10-CM | POA: Diagnosis not present

## 2014-09-01 DIAGNOSIS — Z6833 Body mass index (BMI) 33.0-33.9, adult: Secondary | ICD-10-CM | POA: Diagnosis not present

## 2014-09-01 NOTE — Patient Instructions (Signed)
Follow Pre-Op Goals Try Protein Shakes Talk to [place] to see if you need Supervised Weight Loss Appointment: [phone] Call [facility] at [phone] when surgery is scheduled to enroll in Pre-Op Class  Things to remember:  Please always be honest with [place]. We want to support you!  If you have any questions or concerns in between appointments, please call or email Ferol Luz, or Margarita Grizzle.  The diet after surgery will be high protein and low in carbohydrate.  Vitamins and calcium need to be taken for the rest of your life.  Feel free to include support people in any classes or appointments.

## 2014-09-01 NOTE — Progress Notes (Signed)
  Pre-Op Assessment Visit:  Pre-Operative Bariatric Surgery  Medical Nutrition Therapy:  Appt start time: 8676   End time:  0845.  Patient was seen on 09/01/2014 for Pre-Operative Nutrition Assessment. Assessment and letter of approval faxed to [facility] Surgery Bariatric Surgery Program coordinator on 09/01/2014.   Preferred Learning Style:   No preference indicated   Learning Readiness:   Ready  Handouts given during visit include:  Pre-Op Goals Bariatric Surgery Protein Shakes  During the appointment today the following Pre-Op Goals were reviewed with the patient: Maintain or lose weight as instructed by your surgeon Make healthy food choices Begin to limit portion sizes Limited concentrated sugars and fried foods Keep fat/sugar in the single digits per serving on   food labels Practice CHEWING your food  (aim for 30 chews per bite or until applesauce consistency) Practice not drinking 15 minutes before, during, and 30 minutes after each meal/snack Avoid all carbonated beverages  Avoid/limit caffeinated beverages  Avoid all sugar-sweetened beverages Consume 3 meals per day; eat every 3-5 hours Make a list of non-food related activities Aim for 64-100 ounces of FLUID daily  Aim for at least 60-80 grams of PROTEIN daily Look for a liquid protein source that contain ?15 g protein and ?5 g carbohydrate  (ex: shakes, drinks, shots)  Patient-Centered Goals: She would like to come off of some of her medications. 8 level of confidence/10  Level of importance  Demonstrated degree of understanding via:  Teach Back  Teaching Method Utilized:  Visual Auditory Hands on  Barriers to learning/adherence to lifestyle change: none  Patient to call the [facility] to enroll in Pre-Op and Post-Op Nutrition Education when surgery date is scheduled.

## 2014-09-07 ENCOUNTER — Other Ambulatory Visit: Payer: Self-pay

## 2014-09-07 ENCOUNTER — Ambulatory Visit (HOSPITAL_COMMUNITY)
Admission: RE | Admit: 2014-09-07 | Discharge: 2014-09-07 | Disposition: A | Discharge status: Home / Self Care | Payer: 59 | Source: Ambulatory Visit | Attending: General Surgery | Admitting: General Surgery

## 2014-09-07 DIAGNOSIS — Z01818 Encounter for other preprocedural examination: Secondary | ICD-10-CM | POA: Insufficient documentation

## 2014-09-07 DIAGNOSIS — Z6833 Body mass index (BMI) 33.0-33.9, adult: Secondary | ICD-10-CM | POA: Diagnosis not present

## 2014-09-20 ENCOUNTER — Encounter: Payer: Self-pay | Admitting: *Deleted

## 2014-09-23 ENCOUNTER — Encounter: Payer: Self-pay | Admitting: Family Medicine

## 2014-09-23 ENCOUNTER — Ambulatory Visit: Payer: 59 | Admitting: Psychiatry

## 2014-09-23 ENCOUNTER — Ambulatory Visit (INDEPENDENT_AMBULATORY_CARE_PROVIDER_SITE_OTHER): Payer: 59 | Admitting: Family Medicine

## 2014-09-23 VITALS — BP 176/82 | HR 69 | Temp 98.7°F | Resp 16 | Ht 64.0 in | Wt 196.2 lb

## 2014-09-23 DIAGNOSIS — I10 Essential (primary) hypertension: Secondary | ICD-10-CM | POA: Insufficient documentation

## 2014-09-23 DIAGNOSIS — E109 Type 1 diabetes mellitus without complications: Secondary | ICD-10-CM

## 2014-09-23 LAB — GLUCOSE, POCT (MANUAL RESULT ENTRY): POC Glucose: 101 mg/dl — AB (ref 70–99)

## 2014-09-23 LAB — COMPLETE METABOLIC PANEL WITH GFR
ALT: 26 U/L (ref 0–35)
AST: 21 U/L (ref 0–37)
Albumin: 4.2 g/dL (ref 3.5–5.2)
Alkaline Phosphatase: 88 U/L (ref 39–117)
BUN: 9 mg/dL (ref 6–23)
CO2: 29 mEq/L (ref 19–32)
Calcium: 9.6 mg/dL (ref 8.4–10.5)
Chloride: 101 mEq/L (ref 96–112)
Creat: 0.62 mg/dL (ref 0.50–1.10)
GFR, Est African American: >89 mL/min
GFR, Est Non African American: >89 mL/min
Glucose, Bld: 88 mg/dL (ref 70–99)
Potassium: 4 mEq/L (ref 3.5–5.3)
Sodium: 140 mEq/L (ref 135–145)
Total Bilirubin: 0.6 mg/dL (ref 0.2–1.2)
Total Protein: 7.3 g/dL (ref 6.0–8.3)

## 2014-09-23 LAB — POCT GLYCOSYLATED HEMOGLOBIN (HGB A1C): Hemoglobin A1C: 8.1

## 2014-09-23 MED ORDER — LOSARTAN POTASSIUM-HCTZ 100-12.5 MG PO TABS: 1.0000 | ORAL_TABLET | Freq: Every day | ORAL | Status: DC

## 2014-09-23 MED ORDER — LOSARTAN POTASSIUM 100 MG PO TABS: 100.0000 mg | ORAL_TABLET | Freq: Every day | ORAL | Status: DC

## 2014-09-23 NOTE — Progress Notes (Signed)
Subjective:  This chart was scribed for Heidi Haber, MD by Leandra Kern, Medical Scribe. This patient was seen in Room 25 and the patient's care was started at 11:30 AM .   Patient ID: Heidi Howell, female    DOB: 24-Jan-1956, 59 y.o.   MRN: [id number]  HPI HPI Comments: Heidi Howell is a 59 y.o. female with a past medical history of DM, and obesity, who presents to [facility] for a follow-up.  Patient reports having a productive cough back after being resolved, worse at night time. The patient believes that Lisinopril has caused that. She states that taking zantac has helped her cough resolve for 4-5 days, however it has came back since. She also believes that eating bananas increase her coughing episodes. Symptoms are also accompanied with rhinorrhea and congestion.  Patient presents here with elevated blood pressure, 176/82 today, and she believes this is due to not having any rest due to not getting enough sleep at night as a result of the coughing.  She reports weight gain, and feeling hungry all the time. Her blood sugar in the morning usually runs in the 60's, max 203 in the evenings depending on the diet. Patient suggests that maybe changing the times that she takes her insulin may resolve that problem. Patient reports her snacks to include: fruits such as bananas, and apples.  She reports feeling fatigued at times.   Review of Systems  Constitutional: Positive for appetite change (increase in apetite) and fatigue.  HENT: Positive for congestion and rhinorrhea.   Respiratory: Positive for cough.        Objective:   Physical Exam  Constitutional: She is oriented to person, place, and time. She appears well-developed and well-nourished. No distress.  HENT:  Head: Normocephalic and atraumatic.  Mouth/Throat: Oropharynx is clear and moist. No oropharyngeal exudate.  Eyes: Pupils are equal, round, and reactive to light.  Neck: Neck supple.    Cardiovascular: Normal rate, regular rhythm and normal heart sounds.   Pulmonary/Chest: Effort normal. No respiratory distress. She has wheezes (faint).  Musculoskeletal: She exhibits no edema.  Neurological: She is alert and oriented to person, place, and time. No cranial nerve deficit.  Skin: Skin is warm and dry. No rash noted.  Psychiatric: She has a normal mood and affect. Her behavior is normal.  Nursing note and vitals reviewed.  BP now is : 148/86    Assessment & Plan:   Rather have the pt has elevated blood sugar than insulin reactions. Moreover, the insulin she is currently taking is making her hungry and causing her to gain weight. Therefore, I am asking her to reduce her insulin to 80mg  daily. If she has any further hypoglycemic episodes or blood sugar below 100, she is instructed to reduce it to 80 units per day, and she take a minimum of 40 units daily.   Follow up with Dr. Tamala Julian and Dr. Loanne Drilling  This chart was scribed in my presence and reviewed by me personally.    ICD-9-CM ICD-10-CM   1. Essential hypertension 401.9 I10 losartan-hydrochlorothiazide (HYZAAR) 100-12.5 MG per tablet     DISCONTINUED: losartan-hydrochlorothiazide (HYZAAR) 100-12.5 MG per tablet  2. Type 1 diabetes mellitus without complication 470.96 G83.6 COMPLETE METABOLIC PANEL WITH GFR     POCT glycosylated hemoglobin (Hb A1C)     POCT glucose (manual entry)     losartan-hydrochlorothiazide (HYZAAR) 100-12.5 MG per tablet     DISCONTINUED: losartan-hydrochlorothiazide (HYZAAR) 100-12.5 MG per  tablet     Signed, Heidi Haber, MD

## 2014-09-23 NOTE — Patient Instructions (Addendum)
If cough is not going away in 2 days, let me know.  I will have you follow up with Dr. Reginia Forts, MD  Also, I want you to decrease the Levemir to 80 units daily for a week. If you still have hypoglycemic (below 100 with shakes) episodes, reduce another 10 units and keep reducing the Levemir weekly by 10 units until the blood sugar remains between 100 and 150 at the lowest.

## 2014-09-28 ENCOUNTER — Ambulatory Visit (INDEPENDENT_AMBULATORY_CARE_PROVIDER_SITE_OTHER): Payer: PRIVATE HEALTH INSURANCE | Admitting: Psychiatry

## 2014-09-28 DIAGNOSIS — F0630 Mood disorder due to known physiological condition, unspecified: Secondary | ICD-10-CM | POA: Diagnosis not present

## 2014-10-11 ENCOUNTER — Encounter: Payer: Self-pay | Admitting: Dietician

## 2014-10-11 ENCOUNTER — Encounter: Payer: 59 | Attending: General Surgery | Admitting: Dietician

## 2014-10-11 VITALS — Ht 64.0 in | Wt 198.5 lb

## 2014-10-11 DIAGNOSIS — Z6833 Body mass index (BMI) 33.0-33.9, adult: Secondary | ICD-10-CM | POA: Insufficient documentation

## 2014-10-11 DIAGNOSIS — E669 Obesity, unspecified: Secondary | ICD-10-CM | POA: Diagnosis not present

## 2014-10-11 DIAGNOSIS — Z713 Dietary counseling and surveillance: Secondary | ICD-10-CM | POA: Diagnosis not present

## 2014-10-11 NOTE — Progress Notes (Signed)
  3 Months Supervised Weight Loss Visit:   Pre-Operative Bariatric Surgery  Medical Nutrition Therapy:  Appt start time: 0815 end time:  0830.  Primary concerns today: Supervised Weight Loss Visit #1. Returns with a 2 lbs weight gain. Has been working on her portions. Has been walking some. Eating 3 meals per day. Does not eat fried foods but feels like she needs to eat less sugar. Drinking 1 cup of coffee with flavored cream and sugar per day and water. Has not tried protein shakes.   Weight: 198.5 lbs BMI: 34.1  Preferred Learning Style:   No preference indicated   Learning Readiness:   Contemplating  Medications: see list  Recent physical activity:  Walking 2 x week for 20 minutes  Progress Towards Goal(s):  In progress.   Nutritional Diagnosis:  [place]-3.3 Obesity related to past poor dietary habits and physical inactivity as evidenced by patient attending supervised weight loss for insurance approval of bariatric surgery.    Intervention:  Nutrition counseling provided. Plan: Plan to not drink 15 minutes before, during, and up through 30 minutes after a meal. Switch to decaf coffee. Try using stevia and milk in coffee. Plan to walk 3-4 x week for 20 minutes. Try protein shakes. Work on not buying foods with sugar.  Think about trying to chew 20-30 x per bite.   Teaching Method Utilized:  Visual Auditory Hands on  Barriers to learning/adherence to lifestyle change: none  Demonstrated degree of understanding via:  Teach Back   Monitoring/Evaluation:  Dietary intake, exercise, and body weight. Follow up in 1 months for 3 month supervised weight loss visit.

## 2014-10-11 NOTE — Patient Instructions (Signed)
Plan to not drink 15 minutes before, during, and up through 30 minutes after a meal. Switch to decaf coffee. Try using stevia and milk in coffee. Plan to walk 3-4 x week for 20 minutes. Try protein shakes. Work on not buying foods with sugar.  Think about trying to chew 20-30 x per bite.

## 2014-10-13 ENCOUNTER — Encounter: Payer: Self-pay | Admitting: Endocrinology

## 2014-10-13 ENCOUNTER — Ambulatory Visit (INDEPENDENT_AMBULATORY_CARE_PROVIDER_SITE_OTHER): Payer: 59 | Admitting: Endocrinology

## 2014-10-13 VITALS — BP 132/86 | HR 81 | Temp 98.2°F | Wt 199.0 lb

## 2014-10-13 DIAGNOSIS — E109 Type 1 diabetes mellitus without complications: Secondary | ICD-10-CM

## 2014-10-13 NOTE — Patient Instructions (Addendum)
check your blood sugar twice a day.  vary the time of day when you check, between before the 3 meals, and at bedtime.  also check if you have symptoms of your blood sugar being too high or too low.  please keep a record of the readings and bring it to your next appointment here.  You can write it on any piece of paper.  please call us sooner if your blood sugar goes below 70, or if you have a lot of readings over 200.    Please come back for a follow-up appointment in 2-3 months.   Please continue to pursue the weight-loss surgery.

## 2014-10-13 NOTE — Progress Notes (Signed)
Subjective:    Patient ID: Heidi Howell, female    DOB: [birth year], 57 y.o.   MRN: [id number]  HPI Pt returns for f/u of diabetes mellitus: DM type: Insulin-requiring type 2 Dx'ed: 2263 Complications: none Therapy: insulin since 2014, and invokana.  GDM: never DKA: never Severe hypoglycemia: never Pancreatitis: never Other: she is pursuing weight-loss surgery; she chose a qd insulin schedule Interval history: Pt reduced her insulin to 80 units qam.  no cbg record, but states cbg's vary from 65 (prior to the insulin reduction), up to 200.  It is in general higher as the day goes on.  Since then, cbg's are in the 100's.   Past Medical History  Diagnosis Date  . Yeast infection   . Anemia   . H/O fatigue   . Fibroids   . H/O menorrhagia 2001  . Cyst   . Bartholin gland cyst 06/04/2000  . H/O dysmenorrhea 06/04/2000  . Menopausal symptoms   . Diabetes mellitus   . Infertility, female   . Hypertension   . H/O: obesity   . Varicose vein   . Glaucoma   . FH: colon cancer     Past Surgical History  Procedure Laterality Date  . Dilation and curettage of uterus  1989  . Bartholin gland cyst excision  2010  . Cesarean section  [id number]  . Tubal ligation  1996    bilateral    History   Social History  . Marital Status: Married    Spouse Name: Madelaine Etienne  . Number of Children: N/A  . Years of Education: N/A   Occupational History  . Not on file.   Social History Main Topics  . Smoking status: Never Smoker   . Smokeless tobacco: Never Used  . Alcohol Use: No  . Drug Use: No  . Sexual Activity:    Partners: Male     Comment: number of sex partners in the last 30 months 1   Other Topics Concern  . Not on file   Social History Narrative   Exercise walk 2-3 times for 30-40 minutes    Current Outpatient Prescriptions on File Prior to Visit  Medication Sig Dispense Refill  . ADVAIR DISKUS 100-50 MCG/DOSE AEPB INHALE 1 PUFF INTO THE LUNGS TWICE A DAY 1 each  2  . aspirin 81 MG tablet Take 81 mg by mouth daily.    . canagliflozin (INVOKANA) 100 MG TABS tablet Take 1 tablet (100 mg total) by mouth daily. 30 tablet 5  . cyclobenzaprine (FLEXERIL) 5 MG tablet TAKE 1 TABLET TWICE A DAY AS NEEDED FOR MUSCLE SPASM 180 tablet 1  . estrogens, conjugated, (PREMARIN) 0.3 MG tablet Take 0.3 mg by mouth daily. Take daily for 21 days then do not take for 7 days.    . Fluticasone-Salmeterol (ADVAIR) 100-50 MCG/DOSE AEPB Inhale 1 puff into the lungs 2 (two) times daily. 3 each 0  . glucose blood (ONE TOUCH ULTRA TEST) test strip Test blood sugar 3 times daily. Dx code: E11.9 300 each 3  . Insulin Detemir (LEVEMIR) 100 UNIT/ML Pen Inject 100 Units into the skin every morning. And pen needles 1/day 105 mL 3  . losartan-hydrochlorothiazide (HYZAAR) 100-12.5 MG per tablet Take 1 tablet by mouth daily. 90 tablet 3  . losartan-hydrochlorothiazide (HYZAAR) 100-12.5 MG per tablet Take 1 tablet by mouth daily. 30 tablet 3  . medroxyPROGESTERone (PROVERA) 5 MG tablet Take 5 mg by mouth daily.    . metFORMIN (GLUCOPHAGE) 1000 MG  tablet Take 1 tablet (1,000 mg total) by mouth at bedtime. 90 tablet 3  . metoprolol succinate (TOPROL-XL) 100 MG 24 hr tablet TAKE 1 TABLET DAILY WITH/OR IMMEDIATELY FOLLOWING A MEAL 90 tablet 1  . montelukast (SINGULAIR) 10 MG tablet Take 1 tablet (10 mg total) by mouth at bedtime. 90 tablet 3  . Multiple Vitamin (MULTIVITAMIN) tablet Take 1 tablet by mouth daily.    . OSPHENA 60 MG TABS     . rOPINIRole (REQUIP) 2 MG tablet TAKE ONE-HALF TABLET (1 MG) AT BEDTIME 30 tablet 4  . Travoprost, BAK Free, (TRAVATAN) 0.004 % SOLN ophthalmic solution Place 1 drop into both eyes at bedtime.     Marland Kitchen zolpidem (AMBIEN) 10 MG tablet Take 1 tablet (10 mg total) by mouth at bedtime as needed for sleep. 90 tablet 1   No current facility-administered medications on file prior to visit.    Allergies  Allergen Reactions  . Ace Inhibitors Cough    Family History    Problem Relation Age of Onset  . Cancer Father     colon  . Cancer Mother     per patient stomach  . Diabetes Maternal Grandmother   . Mental illness Sister   . Heart disease Brother     BP 132/86 mmHg  Pulse 81  Temp(Src) 98.2 F (36.8 C) (Oral)  Wt 199 lb (90.266 kg)  SpO2 94%  LMP 01/15/2011    Review of Systems She denies hypoglycemia.      Objective:   Physical Exam VITAL SIGNS:  See vs page GENERAL: no distress Pulses: dorsalis pedis intact bilat.   MSK: no deformity of the feet CV: no leg edema Skin:  no ulcer on the feet.  normal color and temp on the feet. Neuro: sensation is intact to touch on the feet.    Lab Results  Component Value Date   HGBA1C 8.1 09/23/2014      Assessment & Plan:  DM: i offered to increase rx (her choice of oral vs insulin), but pt has declined. Obesity: persistent  Patient is advised the following: Patient Instructions  check your blood sugar twice a day.  vary the time of day when you check, between before the 3 meals, and at bedtime.  also check if you have symptoms of your blood sugar being too high or too low.  please keep a record of the readings and bring it to your next appointment here.  You can write it on any piece of paper.  please call us sooner if your blood sugar goes below 70, or if you have a lot of readings over 200.    Please come back for a follow-up appointment in 2-3 months.   Please continue to pursue the weight-loss surgery.

## 2014-10-26 ENCOUNTER — Ambulatory Visit (INDEPENDENT_AMBULATORY_CARE_PROVIDER_SITE_OTHER): Payer: PRIVATE HEALTH INSURANCE | Admitting: Psychiatry

## 2014-10-26 DIAGNOSIS — F0630 Mood disorder due to known physiological condition, unspecified: Secondary | ICD-10-CM | POA: Diagnosis not present

## 2014-10-28 ENCOUNTER — Other Ambulatory Visit: Payer: Self-pay | Admitting: Physician Assistant

## 2014-10-28 NOTE — Telephone Encounter (Signed)
Meds ordered this encounter  Medications  . INVOKANA 100 MG TABS tablet    Sig: TAKE 1 TABLET DAILY    Dispense:  30 tablet    Refill:  4   When you let her know I authorized this, remind her that she still should follow-up with Dr. Loanne Drilling in 2-3 months, as he advised.

## 2014-10-28 NOTE — Telephone Encounter (Signed)
It looks like she is followed by an endocrine doctor. Can we refill this?

## 2014-10-29 NOTE — Telephone Encounter (Signed)
Notified pt on VM RF was sent, but she still needs to f/up w/Dr Loanne Drilling.

## 2014-11-10 ENCOUNTER — Ambulatory Visit: Payer: 59 | Admitting: Dietician

## 2014-11-15 ENCOUNTER — Encounter: Payer: 59 | Attending: General Surgery | Admitting: Dietician

## 2014-11-15 VITALS — Ht 63.0 in | Wt 196.6 lb

## 2014-11-15 DIAGNOSIS — Z713 Dietary counseling and surveillance: Secondary | ICD-10-CM | POA: Diagnosis not present

## 2014-11-15 DIAGNOSIS — Z6833 Body mass index (BMI) 33.0-33.9, adult: Secondary | ICD-10-CM | POA: Insufficient documentation

## 2014-11-15 DIAGNOSIS — E669 Obesity, unspecified: Secondary | ICD-10-CM | POA: Insufficient documentation

## 2014-11-15 NOTE — Patient Instructions (Signed)
Plan to not drink 15 minutes before, during, and up through 30 minutes after a meal. Switch to decaf coffee. Try using stevia and milk in coffee. Plan to walk 3-4 x week for 20 minutes. Try protein shakes. Work on not buying foods with sugar.  Think about trying to chew 20-30 x per bite.

## 2014-11-15 NOTE — Progress Notes (Signed)
  3 Months Supervised Weight Loss Visit:   Pre-Operative Bariatric Surgery  Medical Nutrition Therapy:  Appt start time: 1030 end time:    Primary concerns today: Supervised Weight Loss Visit #2. Returns with a 2 lbs weight loss. She states that she has been working on chewing food 30 chews per bite. Still has not tried a protein shake. Trying to walk more overall.  Weight: 196.6 lbs BMI: 34.9  Preferred Learning Style:   No preference indicated   Learning Readiness:   Contemplating  Medications: see list  Recent physical activity:  Walking 2 x week for 20 minutes  Progress Towards Goal(s):  In progress.   Nutritional Diagnosis:  [place]-3.3 Obesity related to past poor dietary habits and physical inactivity as evidenced by patient attending supervised weight loss for insurance approval of bariatric surgery.    Intervention:  Nutrition counseling provided. Plan: Plan to not drink 15 minutes before, during, and up through 30 minutes after a meal. Switch to decaf coffee. Try using stevia and milk in coffee. Plan to walk 3-4 x week for 20 minutes. Try protein shakes. Work on not buying foods with sugar.  Think about trying to chew 20-30 x per bite.   Teaching Method Utilized:  Visual Auditory Hands on  Barriers to learning/adherence to lifestyle change: none  Demonstrated degree of understanding via:  Teach Back   Monitoring/Evaluation:  Dietary intake, exercise, and body weight. Follow up in 1 months for 3 month supervised weight loss visit.

## 2014-11-29 ENCOUNTER — Ambulatory Visit: Payer: 59 | Admitting: Family Medicine

## 2014-12-10 ENCOUNTER — Ambulatory Visit: Payer: 59 | Admitting: Dietician

## 2014-12-13 ENCOUNTER — Encounter: Payer: Self-pay | Admitting: Dietician

## 2014-12-13 ENCOUNTER — Encounter: Payer: 59 | Attending: General Surgery | Admitting: Dietician

## 2014-12-13 VITALS — Ht 63.0 in | Wt 195.6 lb

## 2014-12-13 DIAGNOSIS — Z713 Dietary counseling and surveillance: Secondary | ICD-10-CM | POA: Insufficient documentation

## 2014-12-13 DIAGNOSIS — E669 Obesity, unspecified: Secondary | ICD-10-CM | POA: Diagnosis present

## 2014-12-13 DIAGNOSIS — Z6833 Body mass index (BMI) 33.0-33.9, adult: Secondary | ICD-10-CM | POA: Diagnosis not present

## 2014-12-13 NOTE — Patient Instructions (Signed)
Plan to not drink 15 minutes before, during, and up through 30 minutes after a meal. Switch to decaf coffee. Try using stevia and milk in coffee. Plan to walk 3-4 x week for 20 minutes. Try protein shakes (premier) Work on not buying foods with sugar.  Think about trying to chew 20-30 x per bite.

## 2014-12-13 NOTE — Progress Notes (Signed)
  3 Months Supervised Weight Loss Visit:   Pre-Operative Bariatric Surgery  Medical Nutrition Therapy:  Appt start time: 400 end time: 930   Primary concerns today: Supervised Weight Loss Visit #3. Returns with a 1 lb weight loss. She reports she has been walking a lot lately. States that chewing thoroughly and not drinking while eating is a work in progress. Drinks only water.   Weight: 195.6 lbs BMI: 34.7  Preferred Learning Style:   No preference indicated   Learning Readiness:   Ready  Medications: see list  Recent physical activity:  Walking 2 x week for 20 minutes  Progress Towards Goal(s):  In progress.   Nutritional Diagnosis:  [place]-3.3 Obesity related to past poor dietary habits and physical inactivity as evidenced by patient attending supervised weight loss for insurance approval of bariatric surgery.    Intervention:  Nutrition counseling provided. Plan: Plan to not drink 15 minutes before, during, and up through 30 minutes after a meal. Switch to decaf coffee. Try using stevia and milk in coffee. Plan to walk 3-4 x week for 20 minutes. Try protein shakes. Work on not buying foods with sugar.  Think about trying to chew 20-30 x per bite.   Teaching Method Utilized:  Visual Auditory Hands on  Barriers to learning/adherence to lifestyle change: none  Demonstrated degree of understanding via:  Teach Back   Monitoring/Evaluation:  Dietary intake, exercise, and body weight. Follow up in 1 months for 3 month supervised weight loss visit.

## 2014-12-27 ENCOUNTER — Ambulatory Visit: Payer: 59 | Admitting: Family Medicine

## 2015-01-05 ENCOUNTER — Other Ambulatory Visit: Payer: Self-pay | Admitting: Dermatology

## 2015-01-11 ENCOUNTER — Encounter: Payer: Self-pay | Admitting: Dietician

## 2015-01-11 ENCOUNTER — Encounter: Payer: 59 | Attending: General Surgery | Admitting: Dietician

## 2015-01-11 VITALS — Ht 63.0 in | Wt 194.2 lb

## 2015-01-11 DIAGNOSIS — Z6833 Body mass index (BMI) 33.0-33.9, adult: Secondary | ICD-10-CM | POA: Insufficient documentation

## 2015-01-11 DIAGNOSIS — Z713 Dietary counseling and surveillance: Secondary | ICD-10-CM | POA: Diagnosis not present

## 2015-01-11 DIAGNOSIS — E669 Obesity, unspecified: Secondary | ICD-10-CM | POA: Diagnosis present

## 2015-01-11 NOTE — Progress Notes (Signed)
  3 Months Supervised Weight Loss Visit:   Pre-Operative Bariatric Surgery  Medical Nutrition Therapy:  Appt start time: 200 end time: 215  Primary concerns today: Supervised Weight Loss Visit #4. Returns with a 1 lb weight loss. Walking 2-3 x week for 20 minutes. Continuing to work on chewing well. Still working on not drinking while eating.   Weight: 194.2 lbs BMI: 34.5  Preferred Learning Style:   No preference indicated   Learning Readiness:   Ready  Medications: see list  Recent physical activity:  Walking 2 x week for 20 minutes  Progress Towards Goal(s):  In progress.   Nutritional Diagnosis:  [place]-3.3 Obesity related to past poor dietary habits and physical inactivity as evidenced by patient attending supervised weight loss for insurance approval of bariatric surgery.    Intervention:  Nutrition counseling provided. Plan: Plan to not drink 15 minutes before, during, and up through 30 minutes after a meal. Plan to walk 3-4 x week for 20 minutes. Try protein shakes (premier) Work on chewing 20-30 x per bite.   Teaching Method Utilized:  Visual Auditory Hands on  Barriers to learning/adherence to lifestyle change: none  Demonstrated degree of understanding via:  Teach Back   Monitoring/Evaluation:  Dietary intake, exercise, and body weight. Follow up to attend Post Op class.

## 2015-01-11 NOTE — Patient Instructions (Signed)
Plan to not drink 15 minutes before, during, and up through 30 minutes after a meal. Plan to walk 3-4 x week for 20 minutes. Try protein shakes (premier) Work on chewing 20-30 x per bite.   Call [facility] at [phone] when surgery is scheduled to enroll in Pre-Op Class

## 2015-01-13 ENCOUNTER — Encounter: Payer: Self-pay | Admitting: Endocrinology

## 2015-01-13 ENCOUNTER — Ambulatory Visit (INDEPENDENT_AMBULATORY_CARE_PROVIDER_SITE_OTHER): Payer: 59 | Admitting: Endocrinology

## 2015-01-13 VITALS — BP 140/76 | HR 76 | Temp 98.4°F | Ht 64.0 in | Wt 194.0 lb

## 2015-01-13 DIAGNOSIS — E109 Type 1 diabetes mellitus without complications: Secondary | ICD-10-CM | POA: Diagnosis not present

## 2015-01-13 LAB — POCT GLYCOSYLATED HEMOGLOBIN (HGB A1C): Hemoglobin A1C: 8.7

## 2015-01-13 MED ORDER — INSULIN DETEMIR 100 UNIT/ML FLEXPEN: 80.0000 [IU] | PEN_INJECTOR | SUBCUTANEOUS | Status: DC

## 2015-01-13 NOTE — Progress Notes (Signed)
Subjective:    Patient ID: Heidi Howell, female    DOB: 09-Jan-1956, 59 y.o.   MRN: [id number]  HPI Pt returns for f/u of diabetes mellitus: DM type: Insulin-requiring type 2 Dx'ed: 3875 Complications: none Therapy: insulin since 2014, and invokana.  GDM: never DKA: never Severe hypoglycemia: never Pancreatitis: never Other: she is pursuing weight-loss surgery; she chose a qd insulin schedule Interval history: Pt reduced her insulin to 60 units qam, because she says the insulin increases her appetite.  no cbg record, but states cbg's vary from 120-200.  It is in general higher as the day goes on.    Past Medical History  Diagnosis Date  . Yeast infection   . Anemia   . H/O fatigue   . Fibroids   . H/O menorrhagia 2001  . Cyst   . Bartholin gland cyst 06/04/2000  . H/O dysmenorrhea 06/04/2000  . Menopausal symptoms   . Diabetes mellitus   . Infertility, female   . Hypertension   . H/O: obesity   . Varicose vein   . Glaucoma   . FH: colon cancer     Past Surgical History  Procedure Laterality Date  . Dilation and curettage of uterus  1989  . Bartholin gland cyst excision  2010  . Cesarean section  [id number]  . Tubal ligation  1996    bilateral    Social History   Social History  . Marital Status: Married    Spouse Name: Madelaine Etienne  . Number of Children: N/A  . Years of Education: N/A   Occupational History  . Not on file.   Social History Main Topics  . Smoking status: Never Smoker   . Smokeless tobacco: Never Used  . Alcohol Use: No  . Drug Use: No  . Sexual Activity:    Partners: Male     Comment: number of sex partners in the last 45 months 1   Other Topics Concern  . Not on file   Social History Narrative   Exercise walk 2-3 times for 30-40 minutes    Current Outpatient Prescriptions on File Prior to Visit  Medication Sig Dispense Refill  . ADVAIR DISKUS 100-50 MCG/DOSE AEPB INHALE 1 PUFF INTO THE LUNGS TWICE A DAY 1 each 2  . aspirin  81 MG tablet Take 81 mg by mouth daily.    . cyclobenzaprine (FLEXERIL) 5 MG tablet TAKE 1 TABLET TWICE A DAY AS NEEDED FOR MUSCLE SPASM 180 tablet 1  . estrogens, conjugated, (PREMARIN) 0.3 MG tablet Take 0.3 mg by mouth daily. Take daily for 21 days then do not take for 7 days.    . Fluticasone-Salmeterol (ADVAIR) 100-50 MCG/DOSE AEPB Inhale 1 puff into the lungs 2 (two) times daily. 3 each 0  . glucose blood (ONE TOUCH ULTRA TEST) test strip Test blood sugar 3 times daily. Dx code: E11.9 300 each 3  . INVOKANA 100 MG TABS tablet TAKE 1 TABLET DAILY 30 tablet 4  . losartan-hydrochlorothiazide (HYZAAR) 100-12.5 MG per tablet Take 1 tablet by mouth daily. 90 tablet 3  . medroxyPROGESTERone (PROVERA) 5 MG tablet Take 5 mg by mouth daily.    . metFORMIN (GLUCOPHAGE) 1000 MG tablet Take 1 tablet (1,000 mg total) by mouth at bedtime. 90 tablet 3  . metoprolol succinate (TOPROL-XL) 100 MG 24 hr tablet TAKE 1 TABLET DAILY WITH/OR IMMEDIATELY FOLLOWING A MEAL 90 tablet 1  . montelukast (SINGULAIR) 10 MG tablet Take 1 tablet (10 mg total) by mouth at  bedtime. 90 tablet 3  . Multiple Vitamin (MULTIVITAMIN) tablet Take 1 tablet by mouth daily.    . OSPHENA 60 MG TABS     . rOPINIRole (REQUIP) 2 MG tablet TAKE ONE-HALF TABLET (1 MG) AT BEDTIME 30 tablet 4  . Travoprost, BAK Free, (TRAVATAN) 0.004 % SOLN ophthalmic solution Place 1 drop into both eyes at bedtime.     Marland Kitchen zolpidem (AMBIEN) 10 MG tablet Take 1 tablet (10 mg total) by mouth at bedtime as needed for sleep. 90 tablet 1   No current facility-administered medications on file prior to visit.    Allergies  Allergen Reactions  . Ace Inhibitors Cough    Family History  Problem Relation Age of Onset  . Cancer Father     colon  . Cancer Mother     per patient stomach  . Diabetes Maternal Grandmother   . Mental illness Sister   . Heart disease Brother     BP 140/76 mmHg  Pulse 76  Temp(Src) 98.4 F (36.9 C) (Oral)  Ht 5\' 4"  (1.626 m)   Wt 194 lb (87.998 kg)  BMI 33.28 kg/m2  SpO2 97%  LMP 01/15/2011  Review of Systems She denies hypoglycemia    Objective:   Physical Exam VITAL SIGNS:  See vs page GENERAL: no distress Pulses: dorsalis pedis intact bilat.   MSK: no deformity of the feet CV: no leg edema Skin:  no ulcer on the feet.  normal color and temp on the feet. Neuro: sensation is intact to touch on the feet.     A1c=8.7%    Assessment & Plan:  DM: glycemic control is worse.   Obesity, persistent: I offered to add victoza.  She declines, saying that she doesn't want to be disqualified from weight loss surgery.    Patient is advised the following: Patient Instructions  check your blood sugar twice a day.  vary the time of day when you check, between before the 3 meals, and at bedtime.  also check if you have symptoms of your blood sugar being too high or too low.  please keep a record of the readings and bring it to your next appointment here.  You can write it on any piece of paper.  please call us sooner if your blood sugar goes below 70, or if you have a lot of readings over 200.    Please come back for a follow-up appointment in 2-3 months.   Please increase the insulin to 80 units each morning. Please continue to pursue the weight-loss surgery.

## 2015-01-13 NOTE — Patient Instructions (Addendum)
check your blood sugar twice a day.  vary the time of day when you check, between before the 3 meals, and at bedtime.  also check if you have symptoms of your blood sugar being too high or too low.  please keep a record of the readings and bring it to your next appointment here.  You can write it on any piece of paper.  please call us sooner if your blood sugar goes below 70, or if you have a lot of readings over 200.    Please come back for a follow-up appointment in 2-3 months.   Please increase the insulin to 80 units each morning. Please continue to pursue the weight-loss surgery.

## 2015-01-28 ENCOUNTER — Other Ambulatory Visit: Payer: Self-pay | Admitting: Family Medicine

## 2015-04-14 ENCOUNTER — Encounter: Payer: Self-pay | Admitting: Endocrinology

## 2015-04-14 ENCOUNTER — Ambulatory Visit (INDEPENDENT_AMBULATORY_CARE_PROVIDER_SITE_OTHER): Payer: 59 | Admitting: Endocrinology

## 2015-04-14 VITALS — BP 142/76 | HR 63 | Temp 98.0°F | Ht 64.0 in | Wt 199.0 lb

## 2015-04-14 DIAGNOSIS — Z23 Encounter for immunization: Secondary | ICD-10-CM | POA: Diagnosis not present

## 2015-04-14 DIAGNOSIS — Z794 Long term (current) use of insulin: Secondary | ICD-10-CM | POA: Diagnosis not present

## 2015-04-14 DIAGNOSIS — E109 Type 1 diabetes mellitus without complications: Secondary | ICD-10-CM | POA: Diagnosis not present

## 2015-04-14 DIAGNOSIS — E119 Type 2 diabetes mellitus without complications: Secondary | ICD-10-CM | POA: Diagnosis not present

## 2015-04-14 LAB — POCT GLYCOSYLATED HEMOGLOBIN (HGB A1C): HEMOGLOBIN A1C: 8.4

## 2015-04-14 MED ORDER — INSULIN DETEMIR 100 UNIT/ML FLEXPEN: 70.0000 [IU] | PEN_INJECTOR | SUBCUTANEOUS | Status: DC

## 2015-04-14 MED ORDER — INSULIN LISPRO 100 UNIT/ML (KWIKPEN): 10.0000 [IU] | PEN_INJECTOR | Freq: Every day | SUBCUTANEOUS | Status: DC

## 2015-04-14 NOTE — Patient Instructions (Addendum)
check your blood sugar twice a day.  vary the time of day when you check, between before the 3 meals, and at bedtime.  also check if you have symptoms of your blood sugar being too high or too low.  please keep a record of the readings and bring it to your next appointment here.  You can write it on any piece of paper.  please call us sooner if your blood sugar goes below 70, or if you have a lot of readings over 200.    Please come back for a follow-up appointment in 2-3 months.   Please reduce the levemir to 70 units each morning, and add humalog 10 units with your largest meal.   Please continue to pursue the weight-loss surgery.    Please see Vaughan Basta, to consider a "V-GO" pump.

## 2015-04-14 NOTE — Progress Notes (Signed)
Subjective:    Patient ID: Heidi Howell, female    DOB: 1955/10/17, 59 y.o.   MRN: [id number]  HPI Pt returns for f/u of diabetes mellitus: DM type: Insulin-requiring type 2. Dx'ed: [id number] Complications: none. Therapy: insulin since 2014, and invokana.  GDM: never DKA: never Severe hypoglycemia: never Pancreatitis: never.  Other: she is pursuing weight-loss surgery; she chose a qd insulin schedule. Interval history: Pt says she never misses the insulin.  no cbg record, but states cbg's vary from 70-220.  She recently had a steroid injection into her foot.  It is lowest in the mid-morning.  She says it is highest in am, due to eating at hs.  Past Medical History  Diagnosis Date  . Yeast infection   . Anemia   . H/O fatigue   . Fibroids   . H/O menorrhagia 2001  . Cyst   . Bartholin gland cyst 06/04/2000  . H/O dysmenorrhea 06/04/2000  . Menopausal symptoms   . Diabetes mellitus   . Infertility, female   . Hypertension   . H/O: obesity   . Varicose vein   . Glaucoma   . FH: colon cancer     Past Surgical History  Procedure Laterality Date  . Dilation and curettage of uterus  1989  . Bartholin gland cyst excision  2010  . Cesarean section  [id number]  . Tubal ligation  1996    bilateral    Social History   Social History  . Marital Status: Married    Spouse Name: Madelaine Etienne  . Number of Children: N/A  . Years of Education: N/A   Occupational History  . Not on file.   Social History Main Topics  . Smoking status: Never Smoker   . Smokeless tobacco: Never Used  . Alcohol Use: No  . Drug Use: No  . Sexual Activity:    Partners: Male     Comment: number of sex partners in the last 46 months 1   Other Topics Concern  . Not on file   Social History Narrative   Exercise walk 2-3 times for 30-40 minutes    Current Outpatient Prescriptions on File Prior to Visit  Medication Sig Dispense Refill  . ADVAIR DISKUS 100-50 MCG/DOSE AEPB INHALE 1 PUFF INTO  THE LUNGS TWICE A DAY 1 each 2  . aspirin 81 MG tablet Take 81 mg by mouth daily.    . cyclobenzaprine (FLEXERIL) 5 MG tablet TAKE 1 TABLET TWICE A DAY AS NEEDED FOR MUSCLE SPASM 180 tablet 1  . estrogens, conjugated, (PREMARIN) 0.3 MG tablet Take 0.3 mg by mouth daily. Take daily for 21 days then do not take for 7 days.    . Fluticasone-Salmeterol (ADVAIR) 100-50 MCG/DOSE AEPB Inhale 1 puff into the lungs 2 (two) times daily. 3 each 0  . glucose blood (ONE TOUCH ULTRA TEST) test strip Test blood sugar 3 times daily. Dx code: E11.9 300 each 3  . INVOKANA 100 MG TABS tablet TAKE 1 TABLET DAILY 30 tablet 4  . losartan-hydrochlorothiazide (HYZAAR) 100-12.5 MG per tablet Take 1 tablet by mouth daily. 90 tablet 3  . medroxyPROGESTERone (PROVERA) 5 MG tablet Take 5 mg by mouth daily.    . metFORMIN (GLUCOPHAGE) 1000 MG tablet Take 1 tablet (1,000 mg total) by mouth at bedtime. 90 tablet 3  . metoprolol succinate (TOPROL-XL) 100 MG 24 hr tablet TAKE 1 TABLET DAILY WITH/OR IMMEDIATELY FOLLOWING A MEAL 90 tablet 1  . montelukast (SINGULAIR) 10 MG tablet  TAKE 1 TABLET AT BEDTIME.  "OV NEEDED FOR ADDITIONAL REFILLS" 30 tablet 0  . Multiple Vitamin (MULTIVITAMIN) tablet Take 1 tablet by mouth daily.    . OSPHENA 60 MG TABS     . rOPINIRole (REQUIP) 2 MG tablet TAKE ONE-HALF TABLET (1 MG) AT BEDTIME 30 tablet 4  . Travoprost, BAK Free, (TRAVATAN) 0.004 % SOLN ophthalmic solution Place 1 drop into both eyes at bedtime.     Marland Kitchen zolpidem (AMBIEN) 10 MG tablet Take 1 tablet (10 mg total) by mouth at bedtime as needed for sleep. 90 tablet 1   No current facility-administered medications on file prior to visit.    Allergies  Allergen Reactions  . Ace Inhibitors Cough    Family History  Problem Relation Age of Onset  . Cancer Father     colon  . Cancer Mother     per patient stomach  . Diabetes Maternal Grandmother   . Mental illness Sister   . Heart disease Brother     BP 142/76 mmHg  Pulse 63   Temp(Src) 98 F (36.7 C) (Oral)  Ht 5\' 4"  (1.626 m)  Wt 199 lb (90.266 kg)  BMI 34.14 kg/m2  SpO2 96%  LMP 01/15/2011  Review of Systems She denies hypoglycemia    Objective:   Physical Exam VITAL SIGNS:  See vs page GENERAL: no distress SKIN:  Insulin injection sites at the anterior abdomen are normal.   A1c=8.8%     Assessment & Plan:  DM: she needs increased rx Morbid obesity, persistent Foot pain: the steroid injection could be affecting her a1c, but she still needs increased rx.   Patient is advised the following: Patient Instructions  check your blood sugar twice a day.  vary the time of day when you check, between before the 3 meals, and at bedtime.  also check if you have symptoms of your blood sugar being too high or too low.  please keep a record of the readings and bring it to your next appointment here.  You can write it on any piece of paper.  please call us sooner if your blood sugar goes below 70, or if you have a lot of readings over 200.    Please come back for a follow-up appointment in 2-3 months.   Please reduce the levemir to 70 units each morning, and add humalog 10 units with your largest meal.   Please continue to pursue the weight-loss surgery.    Please see Vaughan Basta, to consider a "V-GO" pump.

## 2015-05-30 ENCOUNTER — Other Ambulatory Visit: Payer: Self-pay | Admitting: Family Medicine

## 2015-05-30 ENCOUNTER — Ambulatory Visit
Admission: RE | Admit: 2015-05-30 | Discharge: 2015-05-30 | Disposition: A | Discharge status: Home / Self Care | Payer: 59 | Source: Ambulatory Visit | Attending: Family Medicine | Admitting: Family Medicine

## 2015-05-30 DIAGNOSIS — M130 Polyarthritis, unspecified: Secondary | ICD-10-CM

## 2015-05-30 DIAGNOSIS — R52 Pain, unspecified: Secondary | ICD-10-CM

## 2015-07-13 ENCOUNTER — Ambulatory Visit (INDEPENDENT_AMBULATORY_CARE_PROVIDER_SITE_OTHER): Payer: 59 | Admitting: Endocrinology

## 2015-07-13 ENCOUNTER — Encounter: Payer: Self-pay | Admitting: Endocrinology

## 2015-07-13 VITALS — BP 134/82 | HR 72 | Temp 98.4°F | Ht 64.0 in | Wt 201.0 lb

## 2015-07-13 DIAGNOSIS — Z794 Long term (current) use of insulin: Secondary | ICD-10-CM | POA: Diagnosis not present

## 2015-07-13 DIAGNOSIS — E119 Type 2 diabetes mellitus without complications: Secondary | ICD-10-CM | POA: Diagnosis not present

## 2015-07-13 LAB — POCT GLYCOSYLATED HEMOGLOBIN (HGB A1C): Hemoglobin A1C: 8.2

## 2015-07-13 MED ORDER — INSULIN LISPRO 100 UNIT/ML (KWIKPEN): 15.0000 [IU] | PEN_INJECTOR | Freq: Every day | SUBCUTANEOUS | Status: DC

## 2015-07-13 NOTE — Progress Notes (Signed)
Subjective:    Patient ID: Heidi Howell, female    DOB: [birth year], 60 y.o.   MRN: [id number]  HPI Pt returns for f/u of diabetes mellitus: DM type: Insulin-requiring type 2. Dx'ed: 2007.  Complications: none.  Therapy: insulin since 2014, and invokana.   GDM: never DKA: never Severe hypoglycemia: never Pancreatitis: never.  Other: she is pursuing weight-loss surgery; she chose a simple insulin schedule. Interval history: Pt says she seldom misses the insulin.  no cbg record, but states cbg's vary from 70-200's.  no recent steroids. It is lowest at approx 8 am (she gets to work at 6 am, and lunch is at 8 am).  She does not check at HS.  Past Medical History  Diagnosis Date  . Yeast infection   . Anemia   . H/O fatigue   . Fibroids   . H/O menorrhagia 2001  . Cyst   . Bartholin gland cyst 06/04/2000  . H/O dysmenorrhea 06/04/2000  . Menopausal symptoms   . Diabetes mellitus   . Infertility, female   . Hypertension   . H/O: obesity   . Varicose vein   . Glaucoma   . FH: colon cancer     Past Surgical History  Procedure Laterality Date  . Dilation and curettage of uterus  1989  . Bartholin gland cyst excision  2010  . Cesarean section  [id number]  . Tubal ligation  1996    bilateral    Social History   Social History  . Marital Status: Married    Spouse Name: Madelaine Etienne  . Number of Children: N/A  . Years of Education: N/A   Occupational History  . Not on file.   Social History Main Topics  . Smoking status: Never Smoker   . Smokeless tobacco: Never Used  . Alcohol Use: No  . Drug Use: No  . Sexual Activity:    Partners: Male     Comment: number of sex partners in the last 2 months 1   Other Topics Concern  . Not on file   Social History Narrative   Exercise walk 2-3 times for 30-40 minutes    Current Outpatient Prescriptions on File Prior to Visit  Medication Sig Dispense Refill  . ADVAIR DISKUS 100-50 MCG/DOSE AEPB INHALE 1 PUFF INTO THE  LUNGS TWICE A DAY 1 each 2  . aspirin 81 MG tablet Take 81 mg by mouth daily.    . cyclobenzaprine (FLEXERIL) 5 MG tablet TAKE 1 TABLET TWICE A DAY AS NEEDED FOR MUSCLE SPASM 180 tablet 1  . estrogens, conjugated, (PREMARIN) 0.3 MG tablet Take 0.3 mg by mouth daily. Take daily for 21 days then do not take for 7 days.    . Fluticasone-Salmeterol (ADVAIR) 100-50 MCG/DOSE AEPB Inhale 1 puff into the lungs 2 (two) times daily. 3 each 0  . glucose blood (ONE TOUCH ULTRA TEST) test strip Test blood sugar 3 times daily. Dx code: E11.9 300 each 3  . Insulin Detemir (LEVEMIR) 100 UNIT/ML Pen Inject 70 Units into the skin every morning. And pen needles 1/day 105 mL 3  . INVOKANA 100 MG TABS tablet TAKE 1 TABLET DAILY 30 tablet 4  . losartan-hydrochlorothiazide (HYZAAR) 100-12.5 MG per tablet Take 1 tablet by mouth daily. 90 tablet 3  . medroxyPROGESTERone (PROVERA) 5 MG tablet Take 5 mg by mouth daily.    . metFORMIN (GLUCOPHAGE) 1000 MG tablet Take 1 tablet (1,000 mg total) by mouth at bedtime. 90 tablet 3  .  metoprolol succinate (TOPROL-XL) 100 MG 24 hr tablet TAKE 1 TABLET DAILY WITH/OR IMMEDIATELY FOLLOWING A MEAL 90 tablet 1  . montelukast (SINGULAIR) 10 MG tablet TAKE 1 TABLET AT BEDTIME.  "OV NEEDED FOR ADDITIONAL REFILLS" 30 tablet 0  . Multiple Vitamin (MULTIVITAMIN) tablet Take 1 tablet by mouth daily.    . OSPHENA 60 MG TABS     . rOPINIRole (REQUIP) 2 MG tablet TAKE ONE-HALF TABLET (1 MG) AT BEDTIME 30 tablet 4  . Travoprost, BAK Free, (TRAVATAN) 0.004 % SOLN ophthalmic solution Place 1 drop into both eyes at bedtime.     Marland Kitchen zolpidem (AMBIEN) 10 MG tablet Take 1 tablet (10 mg total) by mouth at bedtime as needed for sleep. 90 tablet 1   No current facility-administered medications on file prior to visit.    Allergies  Allergen Reactions  . Ace Inhibitors Cough    Family History  Problem Relation Age of Onset  . Cancer Father     colon  . Cancer Mother     per patient stomach  .  Diabetes Maternal Grandmother   . Mental illness Sister   . Heart disease Brother     BP 134/82 mmHg  Pulse 72  Temp(Src) 98.4 F (36.9 C) (Oral)  Ht 5\' 4"  (1.626 m)  Wt 201 lb (91.173 kg)  BMI 34.48 kg/m2  SpO2 97%  LMP 01/15/2011    Review of Systems She denies hypoglycemia.      Objective:   Physical Exam VITAL SIGNS:  See vs page GENERAL: no distress Pulses: dorsalis pedis intact bilat.   MSK: no deformity of the feet CV: no leg edema Skin:  no ulcer on the feet.  normal color and temp on the feet. Neuro: sensation is intact to touch on the feet   A1c=8.2%    Assessment & Plan:  DM: she needs increased rx  Patient is advised the following: Patient Instructions  check your blood sugar twice a day.  vary the time of day when you check, between before the 3 meals, and at bedtime.  also check if you have symptoms of your blood sugar being too high or too low.  please keep a record of the readings and bring it to your next appointment here.  You can write it on any piece of paper.  please call us sooner if your blood sugar goes below 70, or if you have a lot of readings over 200.    Please come back for a follow-up appointment in 2-3 months.   Please continue the levemir, 70 units each morning, and: Increase the humalog to 15 units with the evening  meal.   Please continue to pursue the weight-loss surgery.    Please see Vaughan Basta, to consider a "V-GO" pump.

## 2015-07-13 NOTE — Patient Instructions (Addendum)
check your blood sugar twice a day.  vary the time of day when you check, between before the 3 meals, and at bedtime.  also check if you have symptoms of your blood sugar being too high or too low.  please keep a record of the readings and bring it to your next appointment here.  You can write it on any piece of paper.  please call us sooner if your blood sugar goes below 70, or if you have a lot of readings over 200.    Please come back for a follow-up appointment in 2-3 months.   Please continue the levemir, 70 units each morning, and: Increase the humalog to 15 units with the evening  meal.   Please continue to pursue the weight-loss surgery.    Please see Vaughan Basta, to consider a "V-GO" pump.

## 2015-07-14 DIAGNOSIS — Z794 Long term (current) use of insulin: Principal | ICD-10-CM

## 2015-07-14 DIAGNOSIS — E119 Type 2 diabetes mellitus without complications: Secondary | ICD-10-CM | POA: Insufficient documentation

## 2015-07-18 LAB — HM DIABETES EYE EXAM

## 2015-08-10 ENCOUNTER — Encounter: Payer: 59 | Attending: Endocrinology | Admitting: Nutrition

## 2015-08-10 DIAGNOSIS — E119 Type 2 diabetes mellitus without complications: Secondary | ICD-10-CM | POA: Insufficient documentation

## 2015-08-10 DIAGNOSIS — Z794 Long term (current) use of insulin: Secondary | ICD-10-CM | POA: Diagnosis present

## 2015-08-10 NOTE — Progress Notes (Signed)
  Patient and her husband were shown the V-go.  She liked it, but prefers to see if her insurance will pay for this, before trying it.  She fill out paperwork, and it was faxed to [employer] . She was told that when she hears from them to call me, if she would like a trial of 6 days, to arrange an appointment.   She was given information on the V-go to review.

## 2015-08-10 NOTE — Patient Instructions (Addendum)
Read over information given and call if questions. Call me if you would like a trial of 6 days, with training.  This will take 1 hour.

## 2015-08-22 ENCOUNTER — Telehealth: Payer: Self-pay | Admitting: Nutrition

## 2015-08-22 NOTE — Telephone Encounter (Signed)
PT called requesting to speak to you about an insulin pump

## 2015-08-23 ENCOUNTER — Telehealth: Payer: Self-pay | Admitting: Endocrinology

## 2015-08-23 NOTE — Telephone Encounter (Signed)
See note below to be advised, Thanks! 

## 2015-08-23 NOTE — Telephone Encounter (Signed)
VGo called and said that her insurance will cover the pump

## 2015-08-23 NOTE — Telephone Encounter (Signed)
Patient wanted to know if her insurance would pay for the V-go.  She had not heard back from them.  She was given the [facility] customer care number to call.

## 2015-08-25 ENCOUNTER — Telehealth: Payer: Self-pay | Admitting: Endocrinology

## 2015-08-25 NOTE — Telephone Encounter (Signed)
Patient need a prescription for insulin that goes in her vgo pump. She ask for a call back.

## 2015-08-26 MED ORDER — INSULIN LISPRO 100 UNIT/ML ~~LOC~~ SOLN: SUBCUTANEOUS | Status: DC

## 2015-08-26 NOTE — Telephone Encounter (Signed)
Ok, i have sent a prescription to [facility]. Please come in for ov here < 1 week after starting V-GO

## 2015-08-26 NOTE — Telephone Encounter (Signed)
See note below. Is the pt currently on the v-go pump? The current med list states the pt is taking the humalog and levemir in the pen.

## 2015-08-26 NOTE — Telephone Encounter (Signed)
I contacted the pt and left a vm advising the Humalog has been sent and please call back and schedule her 1 week follow up appointment.

## 2015-08-29 ENCOUNTER — Other Ambulatory Visit: Payer: Self-pay

## 2015-08-29 MED ORDER — V-GO 30 KIT: 1.0000 | PACK | Freq: Every day | Status: DC

## 2015-08-29 MED ORDER — INSULIN LISPRO 100 UNIT/ML ~~LOC~~ SOLN: SUBCUTANEOUS | Status: DC

## 2015-08-29 NOTE — Addendum Note (Signed)
Addended by: Renato Shin on: 08/29/2015 11:46 AM   Modules accepted: Orders

## 2015-08-29 NOTE — Telephone Encounter (Signed)
Patient came in today thinking she had an appt. Was confused about what she needed to do to start on this.  She was given an appt. For next Monday.  I explained the procedure to her and her husband.  They had no final questions.

## 2015-08-29 NOTE — Telephone Encounter (Signed)
Ok, i [facility]

## 2015-09-05 ENCOUNTER — Encounter: Payer: 59 | Attending: Endocrinology | Admitting: Nutrition

## 2015-09-05 DIAGNOSIS — E119 Type 2 diabetes mellitus without complications: Secondary | ICD-10-CM

## 2015-09-05 DIAGNOSIS — Z794 Long term (current) use of insulin: Secondary | ICD-10-CM | POA: Insufficient documentation

## 2015-09-05 NOTE — Progress Notes (Signed)
Heidi Howell and her husband were instructed on how to fill, apply and use the V-go.  They both filled a demo with water and then MS. Madrigal filled her V-Go 30 with Humalog insulin.   She was given a bottle of Humalog to use with the directions to give no button presses.  She was concerned that she takes 15u acS, and is afraid she will go high.  She was told to test her blood sugars before supper, and if high, take 4 button presses.   She agreed to do this. She attached the V-Go to her upper left abdomen and inserted the needle without any difficulty.  She redemonstrated how to give the button presses, if need be, and had no final questions.   She will test her blood sugars before meals, and at bedtime and told to call me tomorrow with readings.  She agreed to do this. She was given the card with the [facility] customer care number to call if questions develop.  She was told to call here if blood sugars drop below 70, or remain over 250.  She agreed to do this.   She did not take her Levemir last night, and is aware she will take no more Levemir.  She did not take any insulin this AM.

## 2015-09-05 NOTE — Patient Instructions (Addendum)
Fill and apply a new V-Go every 24 hours.   Take no button presses before meals.   If blood sugar is over 250 before supper tonight, give 4 button presses before supper. Call readings tomorrow.

## 2015-09-07 ENCOUNTER — Telehealth: Payer: Self-pay | Admitting: Endocrinology

## 2015-09-07 NOTE — Telephone Encounter (Signed)
Patient ask you to give her a call back.

## 2015-09-13 NOTE — Telephone Encounter (Signed)
Patient was calling in blood sugar readings. FBSs: 150-180,  AcL: all in the 200s, acS: 170s, HS  [id number] Taking 1 click with breakfast and lunch, and 4 clicks with supper meal  Plan: increase clicks at breakfast to 2 clicks.    To see Dr. Loanne Drilling on the 24th.

## 2015-09-28 ENCOUNTER — Ambulatory Visit (INDEPENDENT_AMBULATORY_CARE_PROVIDER_SITE_OTHER): Payer: 59 | Admitting: Endocrinology

## 2015-09-28 ENCOUNTER — Encounter: Payer: Self-pay | Admitting: Endocrinology

## 2015-09-28 VITALS — BP 132/80 | HR 75 | Temp 98.1°F | Ht 64.0 in | Wt 204.0 lb

## 2015-09-28 DIAGNOSIS — Z794 Long term (current) use of insulin: Secondary | ICD-10-CM

## 2015-09-28 DIAGNOSIS — E119 Type 2 diabetes mellitus without complications: Secondary | ICD-10-CM

## 2015-09-28 LAB — POCT GLYCOSYLATED HEMOGLOBIN (HGB A1C): Hemoglobin A1C: 8.6

## 2015-09-28 NOTE — Progress Notes (Signed)
Subjective:    Patient ID: Heidi Howell, female    DOB: 03-15-1956, 60 y.o.   MRN: [id number]  HPI Pt returns for f/u of diabetes mellitus: DM type: Insulin-requiring type 2. Dx'ed: 2007.  Complications: none.  Therapy: insulin since 2014, and invokana.   GDM: never DKA: never Severe hypoglycemia: never Pancreatitis: never.  Other: she is pursuing weight-loss surgery; she chose a simple insulin schedule. Interval history: she started  V-GO-30 pump in early 2017.  She takes these clicks breakfast:2, lunch:1, and supper:4 (1 click is 2 units).  no cbg record, but states cbg's vary from 120-230.  It is lowest at lunch, and highest at hs and in am.   Past Medical History  Diagnosis Date  . Yeast infection   . Anemia   . H/O fatigue   . Fibroids   . H/O menorrhagia 2001  . Cyst   . Bartholin gland cyst 06/04/2000  . H/O dysmenorrhea 06/04/2000  . Menopausal symptoms   . Diabetes mellitus   . Infertility, female   . Hypertension   . H/O: obesity   . Varicose vein   . Glaucoma   . FH: colon cancer     Past Surgical History  Procedure Laterality Date  . Dilation and curettage of uterus  1989  . Bartholin gland cyst excision  2010  . Cesarean section  [id number]  . Tubal ligation  1996    bilateral    Social History   Social History  . Marital Status: Married    Spouse Name: Madelaine Etienne  . Number of Children: N/A  . Years of Education: N/A   Occupational History  . Not on file.   Social History Main Topics  . Smoking status: Never Smoker   . Smokeless tobacco: Never Used  . Alcohol Use: No  . Drug Use: No  . Sexual Activity:    Partners: Male     Comment: number of sex partners in the last 4 months 1   Other Topics Concern  . Not on file   Social History Narrative   Exercise walk 2-3 times for 30-40 minutes    Current Outpatient Prescriptions on File Prior to Visit  Medication Sig Dispense Refill  . ADVAIR DISKUS 100-50 MCG/DOSE AEPB INHALE 1 PUFF  INTO THE LUNGS TWICE A DAY 1 each 2  . amLODipine (NORVASC) 5 MG tablet     . aspirin 81 MG tablet Take 81 mg by mouth daily.    Marland Kitchen BYSTOLIC 20 MG TABS     . carvedilol (COREG) 25 MG tablet     . cyclobenzaprine (FLEXERIL) 5 MG tablet TAKE 1 TABLET TWICE A DAY AS NEEDED FOR MUSCLE SPASM 180 tablet 1  . estrogens, conjugated, (PREMARIN) 0.3 MG tablet Take 0.3 mg by mouth daily. Take daily for 21 days then do not take for 7 days.    . Fluticasone-Salmeterol (ADVAIR) 100-50 MCG/DOSE AEPB Inhale 1 puff into the lungs 2 (two) times daily. 3 each 0  . glucose blood (ONE TOUCH ULTRA TEST) test strip Test blood sugar 3 times daily. Dx code: E11.9 300 each 3  . hydrochlorothiazide (HYDRODIURIL) 25 MG tablet     . Insulin Disposable Pump (V-GO 30) KIT 1 Device by Does not apply route daily. 90 kit 3  . insulin lispro (HUMALOG) 100 UNIT/ML injection For use in pump, total of 78 units per day 80 mL 2  . INVOKANA 100 MG TABS tablet TAKE 1 TABLET DAILY 30 tablet 4  .  losartan-hydrochlorothiazide (HYZAAR) 100-12.5 MG per tablet Take 1 tablet by mouth daily. 90 tablet 3  . medroxyPROGESTERone (PROVERA) 5 MG tablet Take 5 mg by mouth daily.    . metFORMIN (GLUCOPHAGE) 1000 MG tablet Take 1 tablet (1,000 mg total) by mouth at bedtime. 90 tablet 3  . metoprolol succinate (TOPROL-XL) 100 MG 24 hr tablet TAKE 1 TABLET DAILY WITH/OR IMMEDIATELY FOLLOWING A MEAL 90 tablet 1  . montelukast (SINGULAIR) 10 MG tablet TAKE 1 TABLET AT BEDTIME.  "OV NEEDED FOR ADDITIONAL REFILLS" 30 tablet 0  . Multiple Vitamin (MULTIVITAMIN) tablet Take 1 tablet by mouth daily.    . OSPHENA 60 MG TABS     . rOPINIRole (REQUIP) 2 MG tablet TAKE ONE-HALF TABLET (1 MG) AT BEDTIME 30 tablet 4  . telmisartan (MICARDIS) 80 MG tablet     . Travoprost, BAK Free, (TRAVATAN) 0.004 % SOLN ophthalmic solution Place 1 drop into both eyes at bedtime.     Marland Kitchen zolpidem (AMBIEN) 10 MG tablet Take 1 tablet (10 mg total) by mouth at bedtime as needed for  sleep. 90 tablet 1   No current facility-administered medications on file prior to visit.    Allergies  Allergen Reactions  . Ace Inhibitors Cough    Family History  Problem Relation Age of Onset  . Cancer Father     colon  . Cancer Mother     per patient stomach  . Diabetes Maternal Grandmother   . Mental illness Sister   . Heart disease Brother     BP 132/80 mmHg  Pulse 75  Temp(Src) 98.1 F (36.7 C) (Oral)  Ht _0  (1.626 m)  Wt 204 lb (92.534 kg)  BMI 35.00 kg/m2  SpO2 96%  LMP 01/15/2011  Review of Systems She denies hypoglycemia.      Objective:   Physical Exam VITAL SIGNS:  See vs page GENERAL: no distress Pulses: dorsalis pedis intact bilat.   MSK: no deformity of the feet CV: no leg edema Skin:  no ulcer on the feet.  normal color and temp on the feet. Neuro: sensation is intact to touch on the feet  A1c=8.6%     Assessment & Plan:  DM: she needs increased rx.    Patient is advised the following: Patient Instructions  check your blood sugar twice a day.  vary the time of day when you check, between before the 3 meals, and at bedtime.  also check if you have symptoms of your blood sugar being too high or too low.  please keep a record of the readings and bring it to your next appointment here.  You can write it on any piece of paper.  please call us sooner if your blood sugar goes below 70, or if you have a lot of readings over 200.    Please come back for a follow-up appointment in 3 months.  Please increase clicks to clicks breakfast:2, lunch:1, and supper:6 Please call us next week, to tell us how the blood sugar is doing.

## 2015-09-28 NOTE — Patient Instructions (Addendum)
check your blood sugar twice a day.  vary the time of day when you check, between before the 3 meals, and at bedtime.  also check if you have symptoms of your blood sugar being too high or too low.  please keep a record of the readings and bring it to your next appointment here.  You can write it on any piece of paper.  please call us sooner if your blood sugar goes below 70, or if you have a lot of readings over 200.    Please come back for a follow-up appointment in 3 months.  Please increase clicks to clicks breakfast:2, lunch:1, and supper:6 Please call us next week, to tell us how the blood sugar is doing.

## 2015-09-29 ENCOUNTER — Encounter: Payer: Self-pay | Admitting: Endocrinology

## 2015-10-16 ENCOUNTER — Other Ambulatory Visit: Payer: Self-pay | Admitting: Family Medicine

## 2015-11-22 ENCOUNTER — Other Ambulatory Visit: Payer: Self-pay | Admitting: Family Medicine

## 2015-11-24 ENCOUNTER — Other Ambulatory Visit: Payer: Self-pay | Admitting: *Deleted

## 2015-11-24 MED ORDER — GLUCOSE BLOOD VI STRP: ORAL_STRIP | Status: DC

## 2015-12-29 ENCOUNTER — Encounter: Payer: Self-pay | Admitting: Endocrinology

## 2015-12-29 ENCOUNTER — Ambulatory Visit (INDEPENDENT_AMBULATORY_CARE_PROVIDER_SITE_OTHER): Payer: 59 | Admitting: Endocrinology

## 2015-12-29 VITALS — BP 142/80 | Ht 64.0 in | Wt 202.0 lb

## 2015-12-29 DIAGNOSIS — E0800 Diabetes mellitus due to underlying condition with hyperosmolarity without nonketotic hyperglycemic-hyperosmolar coma (NKHHC): Secondary | ICD-10-CM

## 2015-12-29 LAB — POCT GLYCOSYLATED HEMOGLOBIN (HGB A1C): HEMOGLOBIN A1C: 8.2

## 2015-12-29 NOTE — Progress Notes (Signed)
Subjective:    Patient ID: Heidi Howell, female    DOB: 10-16-1955, 60 y.o.   MRN: [id number]  HPI Pt returns for f/u of diabetes mellitus: DM type: Insulin-requiring type 2. Dx'ed: 2007.  Complications: none.  Therapy: insulin since 2014, and invokana.   GDM: never DKA: never Severe hypoglycemia: never. Pancreatitis: never.  Other: she is pursuing weight-loss surgery; she started  V-GO-30 pump in early 2017. Interval history: .  She takes these clicks breakfast:2, lunch:1, and supper:6 (1 click is 2 units).  no cbg record, but states cbg's vary from 120-230.  It is lowest fasting, and highest at HS.  Past Medical History:  Diagnosis Date  . Anemia   . Bartholin gland cyst 06/04/2000  . Cyst   . Diabetes mellitus   . FH: colon cancer   . Fibroids   . Glaucoma   . H/O dysmenorrhea 06/04/2000  . H/O fatigue   . H/O menorrhagia 2001  . H/O: obesity   . Hypertension   . Infertility, female   . Menopausal symptoms   . Varicose vein   . Yeast infection     Past Surgical History:  Procedure Laterality Date  . [place] GLAND CYST EXCISION  2010  . CESAREAN SECTION  [id number]  . DILATION AND CURETTAGE OF UTERUS  1989  . TUBAL LIGATION  1996   bilateral    Social History   Social History  . Marital status: Married    Spouse name: Madelaine Etienne  . Number of children: N/A  . Years of education: N/A   Occupational History  . Not on file.   Social History Main Topics  . Smoking status: Never Smoker  . Smokeless tobacco: Never Used  . Alcohol use No  . Drug use: No  . Sexual activity: Yes    Partners: Male     Comment: number of sex partners in the last 12 months 1   Other Topics Concern  . Not on file   Social History Narrative   Exercise walk 2-3 times for 30-40 minutes    Current Outpatient Prescriptions on File Prior to Visit  Medication Sig Dispense Refill  . ADVAIR DISKUS 100-50 MCG/DOSE AEPB INHALE 1 PUFF INTO THE LUNGS TWICE A DAY 1 each 2  .  amLODipine (NORVASC) 5 MG tablet     . aspirin 81 MG tablet Take 81 mg by mouth daily.    Marland Kitchen BYSTOLIC 20 MG TABS     . carvedilol (COREG) 25 MG tablet     . cyclobenzaprine (FLEXERIL) 5 MG tablet TAKE 1 TABLET TWICE A DAY AS NEEDED FOR MUSCLE SPASM 180 tablet 1  . estrogens, conjugated, (PREMARIN) 0.3 MG tablet Take 0.3 mg by mouth daily. Take daily for 21 days then do not take for 7 days.    . Fluticasone-Salmeterol (ADVAIR) 100-50 MCG/DOSE AEPB Inhale 1 puff into the lungs 2 (two) times daily. 3 each 0  . glucose blood (ONE TOUCH ULTRA TEST) test strip Use as instructed to check sugar three times daily Dx: E11.9 300 each 3  . hydrochlorothiazide (HYDRODIURIL) 25 MG tablet     . Insulin Disposable Pump (V-GO 30) KIT 1 Device by Does not apply route daily. 90 kit 3  . insulin lispro (HUMALOG) 100 UNIT/ML injection For use in pump, total of 78 units per day 80 mL 2  . INVOKANA 100 MG TABS tablet TAKE 1 TABLET DAILY 30 tablet 4  . losartan-hydrochlorothiazide (HYZAAR) 100-12.5 MG per tablet Take  1 tablet by mouth daily. 90 tablet 3  . medroxyPROGESTERone (PROVERA) 5 MG tablet Take 5 mg by mouth daily.    . metFORMIN (GLUCOPHAGE) 1000 MG tablet Take 1 tablet (1,000 mg total) by mouth at bedtime. 90 tablet 3  . metoprolol succinate (TOPROL-XL) 100 MG 24 hr tablet TAKE 1 TABLET DAILY WITH/OR IMMEDIATELY FOLLOWING A MEAL 90 tablet 1  . montelukast (SINGULAIR) 10 MG tablet TAKE 1 TABLET AT BEDTIME.  "OV NEEDED FOR ADDITIONAL REFILLS" 30 tablet 0  . Multiple Vitamin (MULTIVITAMIN) tablet Take 1 tablet by mouth daily.    . OSPHENA 60 MG TABS     . rOPINIRole (REQUIP) 2 MG tablet TAKE ONE-HALF TABLET (1 MG) AT BEDTIME 30 tablet 4  . telmisartan (MICARDIS) 80 MG tablet     . Travoprost, BAK Free, (TRAVATAN) 0.004 % SOLN ophthalmic solution Place 1 drop into both eyes at bedtime.     Marland Kitchen zolpidem (AMBIEN) 10 MG tablet Take 1 tablet (10 mg total) by mouth at bedtime as needed for sleep. 90 tablet 1   No  current facility-administered medications on file prior to visit.     Allergies  Allergen Reactions  . Ace Inhibitors Cough    Family History  Problem Relation Age of Onset  . Cancer Father     colon  . Cancer Mother     per patient stomach  . Diabetes Maternal Grandmother   . Mental illness Sister   . Heart disease Brother    BP (!) 142/80   Ht _0  (1.626 m)   Wt 202 lb (91.6 kg)   LMP 01/15/2011   BMI 34.67 kg/m   Review of Systems She denies hypoglycemia.      Objective:   Physical Exam VITAL SIGNS:  See vs page GENERAL: no distress Pulses: dorsalis pedis intact bilat.   MSK: no deformity of the feet CV: no leg edema Skin:  no ulcer on the feet.  normal color and temp on the feet. Neuro: sensation is intact to touch on the feet.    A1c=8.2%     Assessment & Plan:  Insulin-requiring type 2 DM: she needs increased rx.

## 2015-12-29 NOTE — Patient Instructions (Addendum)
check your blood sugar twice a day.  vary the time of day when you check, between before the 3 meals, and at bedtime.  also check if you have symptoms of your blood sugar being too high or too low.  please keep a record of the readings and bring it to your next appointment here.  You can write it on any piece of paper.  please call us sooner if your blood sugar goes below 70, or if you have a lot of readings over 200.    Please come back for a follow-up appointment in 3 months.  Please increase clicks to breakfast:2, lunch:2, and supper:7 clicks.

## 2016-04-01 NOTE — Progress Notes (Signed)
Subjective:    Patient ID: Heidi Howell, female    DOB: 12/11/1955, 60 y.o.   MRN: [id number]  HPI Pt returns for f/u of diabetes mellitus: DM type: Insulin-requiring type 2. Dx'ed: 2007.  Complications: none.  Therapy: insulin since 2014, and jardiance.   GDM: never DKA: never Severe hypoglycemia: never. Pancreatitis: never.  Other: she is pursuing weight-loss surgery; she started  V-GO-30 pump in early 2017. Interval history: .  She takes these clicks breakfast:2, lunch:1, and supper:6 (1 click is 2 units).  no cbg record, but states cbg's vary from 107-200.  It is still lowest at lunch, and highest fasting.  Past Medical History:  Diagnosis Date  . Anemia   . Bartholin gland cyst 06/04/2000  . Cyst   . Diabetes mellitus   . FH: colon cancer   . Fibroids   . Glaucoma   . H/O dysmenorrhea 06/04/2000  . H/O fatigue   . H/O menorrhagia 2001  . H/O: obesity   . Hypertension   . Infertility, female   . Menopausal symptoms   . Varicose vein   . Yeast infection     Past Surgical History:  Procedure Laterality Date  . [place] GLAND CYST EXCISION  2010  . CESAREAN SECTION  [id number]  . DILATION AND CURETTAGE OF UTERUS  1989  . TUBAL LIGATION  1996   bilateral    Social History   Social History  . Marital status: Married    Spouse name: Madelaine Etienne  . Number of children: N/A  . Years of education: N/A   Occupational History  . Not on file.   Social History Main Topics  . Smoking status: Never Smoker  . Smokeless tobacco: Never Used  . Alcohol use No  . Drug use: No  . Sexual activity: Yes    Partners: Male     Comment: number of sex partners in the last 12 months 1   Other Topics Concern  . Not on file   Social History Narrative   Exercise walk 2-3 times for 30-40 minutes    Current Outpatient Prescriptions on File Prior to Visit  Medication Sig Dispense Refill  . ADVAIR DISKUS 100-50 MCG/DOSE AEPB INHALE 1 PUFF INTO THE LUNGS TWICE A DAY 1  each 2  . amLODipine (NORVASC) 5 MG tablet     . aspirin 81 MG tablet Take 81 mg by mouth daily.    Marland Kitchen BYSTOLIC 20 MG TABS     . carvedilol (COREG) 25 MG tablet     . cyclobenzaprine (FLEXERIL) 5 MG tablet TAKE 1 TABLET TWICE A DAY AS NEEDED FOR MUSCLE SPASM 180 tablet 1  . estrogens, conjugated, (PREMARIN) 0.3 MG tablet Take 0.3 mg by mouth daily. Take daily for 21 days then do not take for 7 days.    . Fluticasone-Salmeterol (ADVAIR) 100-50 MCG/DOSE AEPB Inhale 1 puff into the lungs 2 (two) times daily. 3 each 0  . glucose blood (ONE TOUCH ULTRA TEST) test strip Use as instructed to check sugar three times daily Dx: E11.9 300 each 3  . hydrochlorothiazide (HYDRODIURIL) 25 MG tablet     . Insulin Disposable Pump (V-GO 30) KIT 1 Device by Does not apply route daily. 90 kit 3  . insulin lispro (HUMALOG) 100 UNIT/ML injection For use in pump, total of 78 units per day 80 mL 2  . losartan-hydrochlorothiazide (HYZAAR) 100-12.5 MG per tablet Take 1 tablet by mouth daily. 90 tablet 3  . medroxyPROGESTERone (PROVERA) 5  MG tablet Take 5 mg by mouth daily.    . metFORMIN (GLUCOPHAGE) 1000 MG tablet Take 1 tablet (1,000 mg total) by mouth at bedtime. 90 tablet 3  . metoprolol succinate (TOPROL-XL) 100 MG 24 hr tablet TAKE 1 TABLET DAILY WITH/OR IMMEDIATELY FOLLOWING A MEAL 90 tablet 1  . montelukast (SINGULAIR) 10 MG tablet TAKE 1 TABLET AT BEDTIME.  "OV NEEDED FOR ADDITIONAL REFILLS" 30 tablet 0  . Multiple Vitamin (MULTIVITAMIN) tablet Take 1 tablet by mouth daily.    . OSPHENA 60 MG TABS     . rOPINIRole (REQUIP) 2 MG tablet TAKE ONE-HALF TABLET (1 MG) AT BEDTIME 30 tablet 4  . telmisartan (MICARDIS) 80 MG tablet     . Travoprost, BAK Free, (TRAVATAN) 0.004 % SOLN ophthalmic solution Place 1 drop into both eyes at bedtime.     Marland Kitchen zolpidem (AMBIEN) 10 MG tablet Take 1 tablet (10 mg total) by mouth at bedtime as needed for sleep. 90 tablet 1   No current facility-administered medications on file prior  to visit.     Allergies  Allergen Reactions  . Ace Inhibitors Cough    Family History  Problem Relation Age of Onset  . Cancer Father     colon  . Cancer Mother     per patient stomach  . Diabetes Maternal Grandmother   . Mental illness Sister   . Heart disease Brother     BP 126/70   Pulse 63   Ht '5\' 4"'  (1.626 m)   Wt 201 lb (91.2 kg)   LMP 01/15/2011   SpO2 97%   BMI 34.50 kg/m    Review of Systems She denies hypoglycemia    Objective:   Physical Exam VITAL SIGNS:  See vs page.  GENERAL: no distress.  Pulses: dorsalis pedis intact bilat.   MSK: no deformity of the feet. CV: no leg edema.  Skin:  no ulcer on the feet.  normal color and temp on the feet. Neuro: sensation is intact to touch on the feet.    A1c=7.9%    Assessment & Plan:  Insulin-requiring type 2 DM: she needs increased rx. Patient is advised the following: Patient Instructions  check your blood sugar twice a day.  vary the time of day when you check, between before the 3 meals, and at bedtime.  also check if you have symptoms of your blood sugar being too high or too low.  please keep a record of the readings and bring it to your next appointment here.  You can write it on any piece of paper.  please call us sooner if your blood sugar goes below 70, or if you have a lot of readings over 200.    Please come back for a follow-up appointment in 3 months.  Please increase clicks to breakfast:2, lunch:2, and supper:7 clicks.

## 2016-04-02 ENCOUNTER — Ambulatory Visit (INDEPENDENT_AMBULATORY_CARE_PROVIDER_SITE_OTHER): Payer: 59 | Admitting: Endocrinology

## 2016-04-02 ENCOUNTER — Encounter: Payer: Self-pay | Admitting: Endocrinology

## 2016-04-02 VITALS — BP 126/70 | HR 63 | Ht 64.0 in | Wt 201.0 lb

## 2016-04-02 DIAGNOSIS — E0800 Diabetes mellitus due to underlying condition with hyperosmolarity without nonketotic hyperglycemic-hyperosmolar coma (NKHHC): Secondary | ICD-10-CM

## 2016-04-02 NOTE — Patient Instructions (Signed)
check your blood sugar twice a day.  vary the time of day when you check, between before the 3 meals, and at bedtime.  also check if you have symptoms of your blood sugar being too high or too low.  please keep a record of the readings and bring it to your next appointment here.  You can write it on any piece of paper.  please call us sooner if your blood sugar goes below 70, or if you have a lot of readings over 200.    Please come back for a follow-up appointment in 3 months.  Please increase clicks to breakfast:2, lunch:2, and supper:7 clicks.

## 2016-05-25 ENCOUNTER — Telehealth: Payer: Self-pay | Admitting: Endocrinology

## 2016-05-25 NOTE — Telephone Encounter (Signed)
Please patient asked if Dr Loanne Drilling call this number so patient can get Co pay reduced,  Coverage review dept [phone]

## 2016-06-01 ENCOUNTER — Other Ambulatory Visit: Payer: Self-pay | Admitting: Endocrinology

## 2016-06-08 NOTE — Telephone Encounter (Signed)
I contacted the coverage review department about getting a lower copayment for the Humalog and was denied. I contacted the patient and advised of this message via voicemail. Patient was also advised she would be receiving a letter stating why the denial was given and what steps would be next to appeal the decision. Patient advised to call back if she had any further questions.

## 2016-06-17 IMAGING — CR DG CHEST 2V
2 series · 2 of 2 positions shown · non-contrast
Comparison: 04/15/2014

CLINICAL DATA: Bariatric screening

EXAM:
CHEST  2 VIEW

[chest pa]
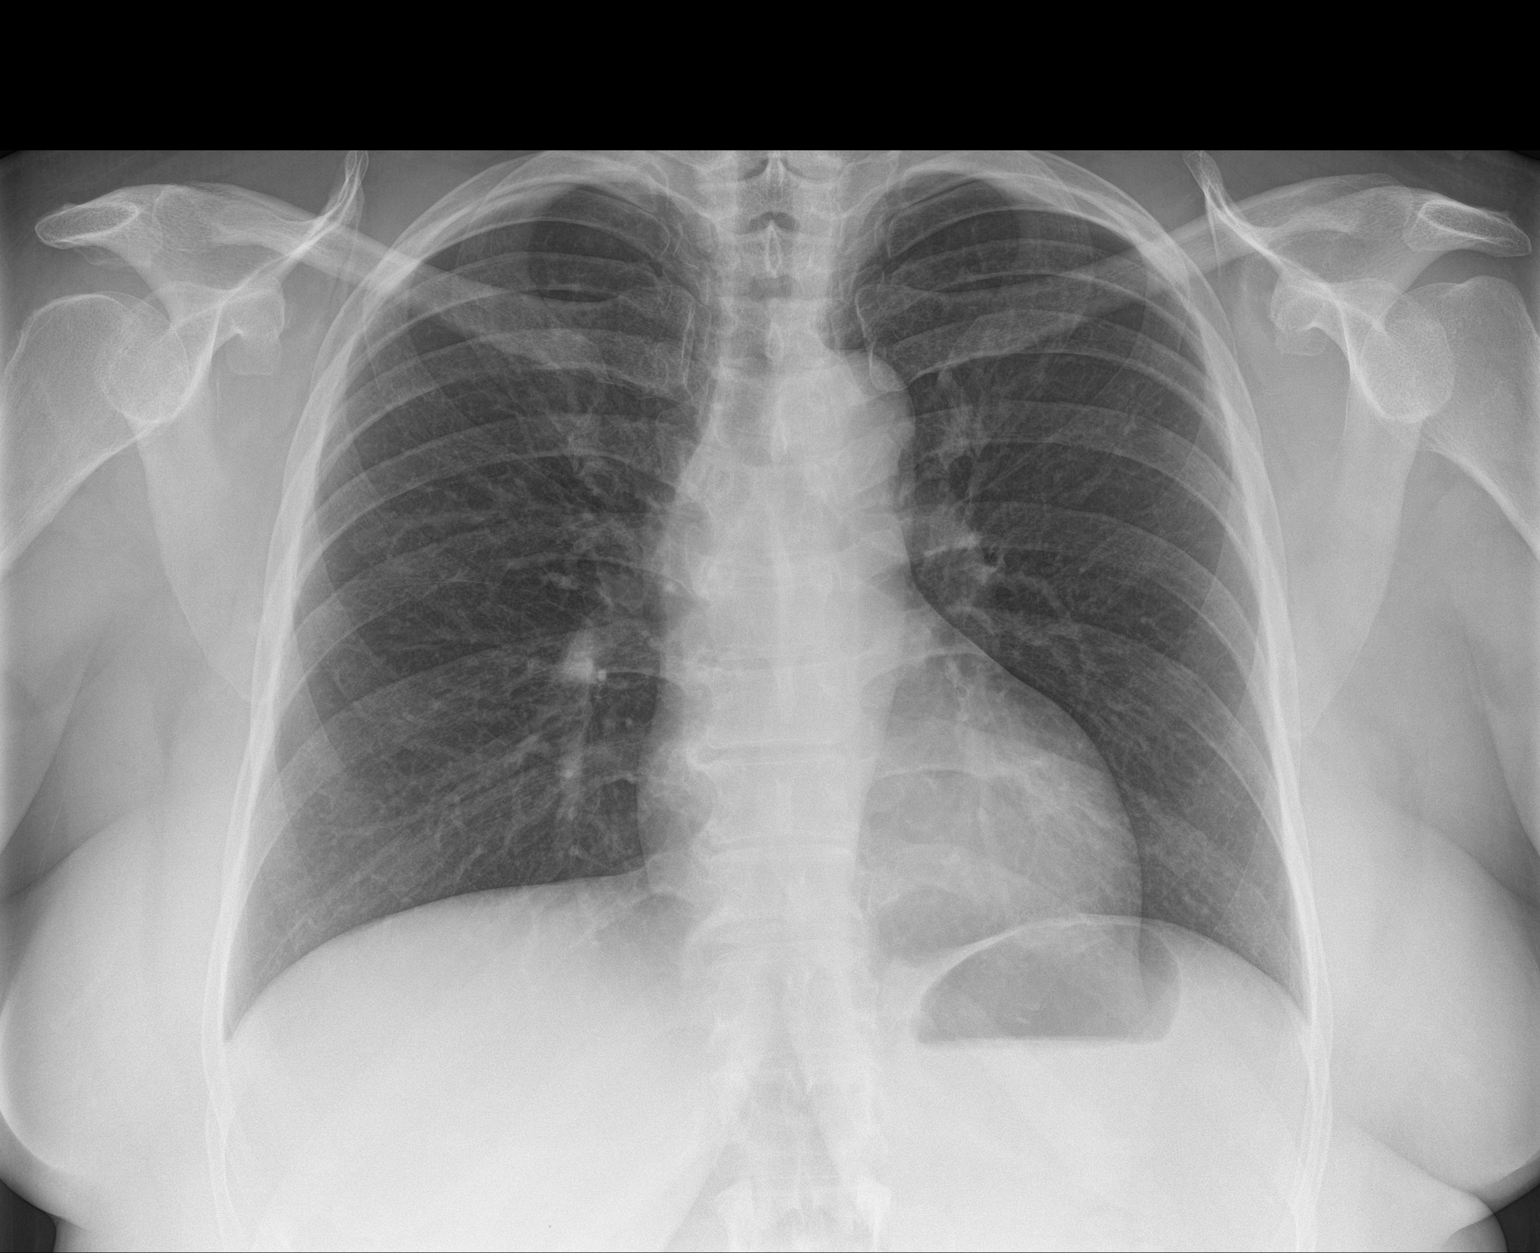

[chest lat]
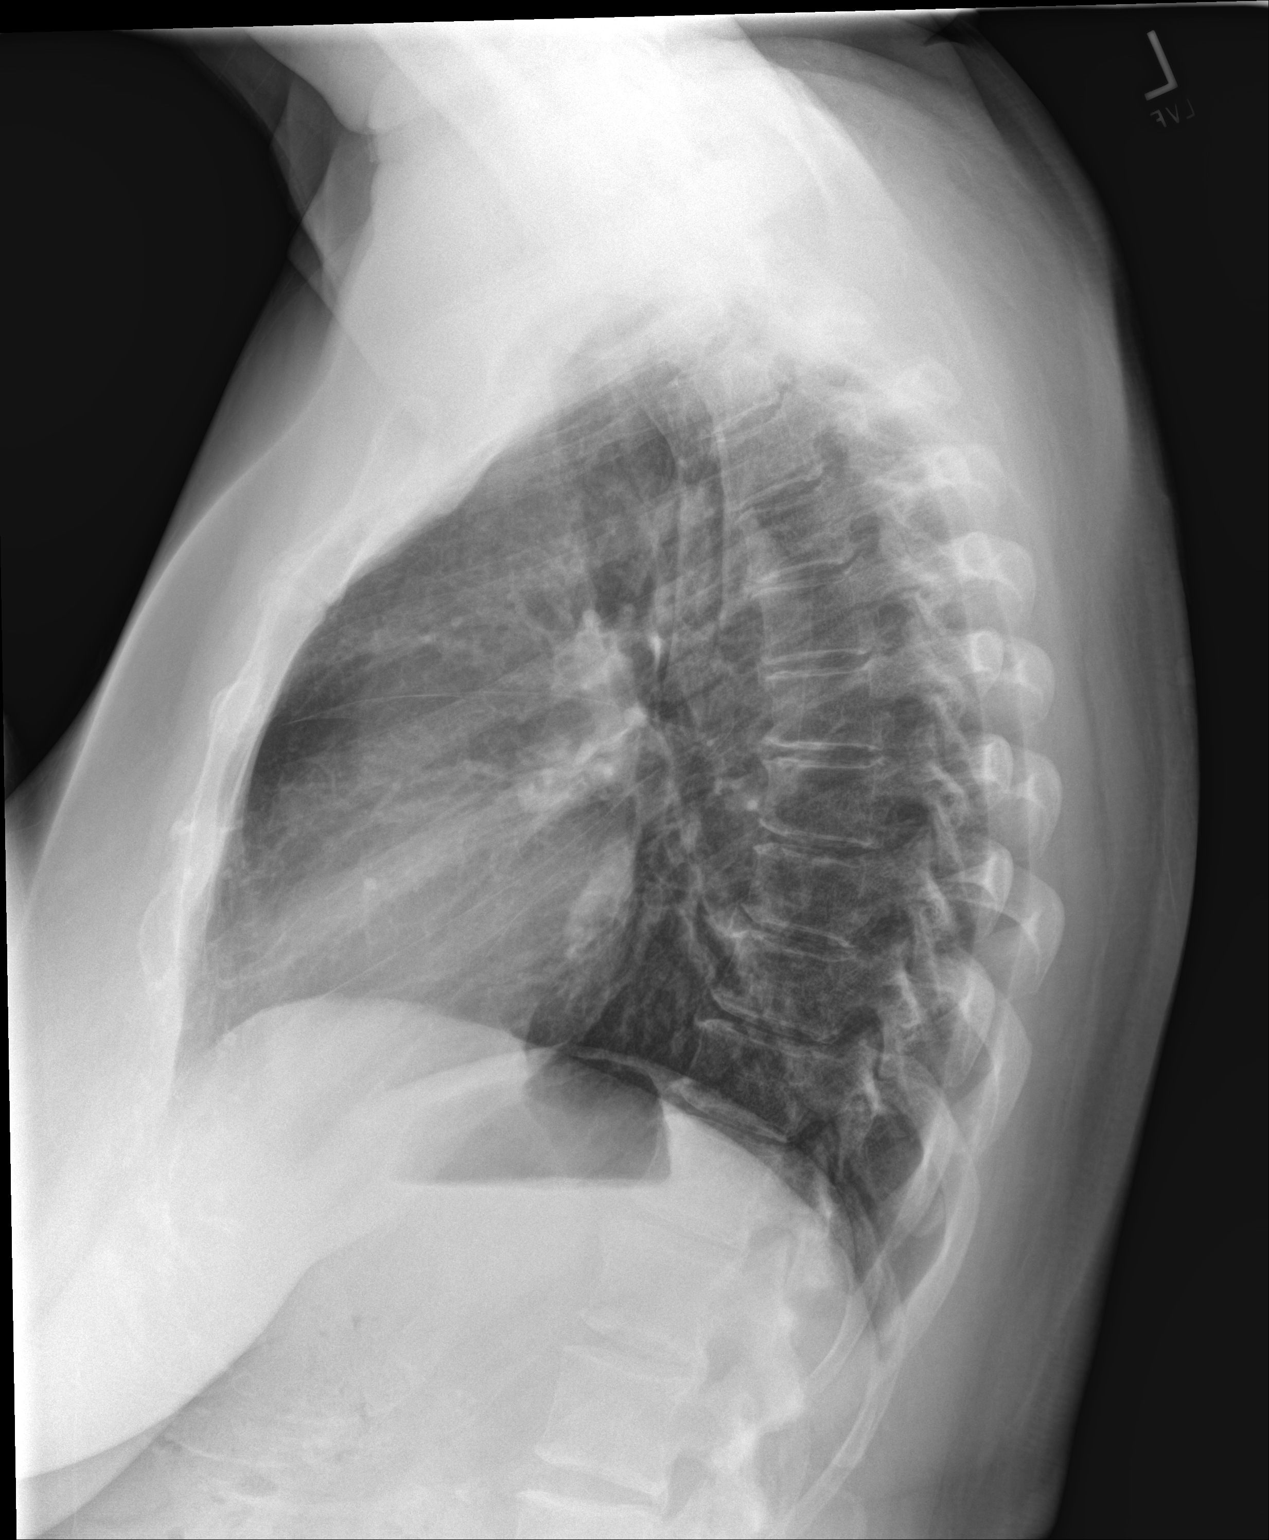

[2 of 2 positions shown; findings below may reference images not displayed]

FINDINGS: Lungs are clear.  No pleural effusion or pneumothorax.

The heart is normal in size.

Mild degenerative changes of the visualized thoracolumbar spine.
IMPRESSION: Normal chest radiographs.

## 2016-07-02 ENCOUNTER — Ambulatory Visit (INDEPENDENT_AMBULATORY_CARE_PROVIDER_SITE_OTHER): Payer: 59 | Admitting: Endocrinology

## 2016-07-02 ENCOUNTER — Encounter: Payer: Self-pay | Admitting: Endocrinology

## 2016-07-02 VITALS — BP 112/72 | HR 65 | Ht 64.0 in | Wt 206.0 lb

## 2016-07-02 DIAGNOSIS — E0800 Diabetes mellitus due to underlying condition with hyperosmolarity without nonketotic hyperglycemic-hyperosmolar coma (NKHHC): Secondary | ICD-10-CM

## 2016-07-02 LAB — POCT GLYCOSYLATED HEMOGLOBIN (HGB A1C): HEMOGLOBIN A1C: 8

## 2016-07-02 MED ORDER — INSULIN ASPART 100 UNIT/ML ~~LOC~~ SOLN: SUBCUTANEOUS | 11 refills | Status: DC

## 2016-07-02 NOTE — Patient Instructions (Addendum)
check your blood sugar twice a day.  vary the time of day when you check, between before the 3 meals, and at bedtime.  also check if you have symptoms of your blood sugar being too high or too low.  please keep a record of the readings and bring it to your next appointment here.  You can write it on any piece of paper.  please call us sooner if your blood sugar goes below 70, or if you have a lot of readings over 200.   Please come back for a follow-up appointment in 2 months.  Please increase clicks to breakfast:3 lunch:3 and supper:8 clicks.

## 2016-07-02 NOTE — Progress Notes (Signed)
Subjective:    Patient ID: Heidi Howell, female    DOB: 1955-06-25, 61 y.o.   MRN: [id number]  HPI Pt returns for f/u of diabetes mellitus: DM type: Insulin-requiring type 2. Dx'ed: 2007.  Complications: none.  Therapy: insulin since 2014, metformin, and jardiance.   GDM: never DKA: never Severe hypoglycemia: never. Pancreatitis: never.  Other: she is pursuing weight-loss surgery; she started  V-GO-30 pump in early 2017. Interval history: .  She takes these clicks breakfast:2, lunch:2, and supper:7 (1 click is 2 units).  no cbg record, but states cbg's vary from 99-200.  It is lowest at work, before she eats breakfast.   Past Medical History:  Diagnosis Date  . Anemia   . Bartholin gland cyst 06/04/2000  . Cyst   . Diabetes mellitus   . FH: colon cancer   . Fibroids   . Glaucoma   . H/O dysmenorrhea 06/04/2000  . H/O fatigue   . H/O menorrhagia 2001  . H/O: obesity   . Hypertension   . Infertility, female   . Menopausal symptoms   . Varicose vein   . Yeast infection     Past Surgical History:  Procedure Laterality Date  . [place] GLAND CYST EXCISION  2010  . CESAREAN SECTION  [id number]  . DILATION AND CURETTAGE OF UTERUS  1989  . TUBAL LIGATION  1996   bilateral    Social History   Social History  . Marital status: Married    Spouse name: Madelaine Etienne  . Number of children: N/A  . Years of education: N/A   Occupational History  . Not on file.   Social History Main Topics  . Smoking status: Never Smoker  . Smokeless tobacco: Never Used  . Alcohol use No  . Drug use: No  . Sexual activity: Yes    Partners: Male     Comment: number of sex partners in the last 12 months 1   Other Topics Concern  . Not on file   Social History Narrative   Exercise walk 2-3 times for 30-40 minutes    Current Outpatient Prescriptions on File Prior to Visit  Medication Sig Dispense Refill  . ADVAIR DISKUS 100-50 MCG/DOSE AEPB INHALE 1 PUFF INTO THE LUNGS TWICE A  DAY 1 each 2  . amLODipine (NORVASC) 5 MG tablet     . aspirin 81 MG tablet Take 81 mg by mouth daily.    Marland Kitchen BYSTOLIC 20 MG TABS     . carvedilol (COREG) 25 MG tablet     . cyclobenzaprine (FLEXERIL) 5 MG tablet TAKE 1 TABLET TWICE A DAY AS NEEDED FOR MUSCLE SPASM 180 tablet 1  . Empagliflozin (JARDIANCE PO) Take by mouth.    . estrogens, conjugated, (PREMARIN) 0.3 MG tablet Take 0.3 mg by mouth daily. Take daily for 21 days then do not take for 7 days.    . Fluticasone-Salmeterol (ADVAIR) 100-50 MCG/DOSE AEPB Inhale 1 puff into the lungs 2 (two) times daily. 3 each 0  . glucose blood (ONE TOUCH ULTRA TEST) test strip Use as instructed to check sugar three times daily Dx: E11.9 300 each 3  . hydrochlorothiazide (HYDRODIURIL) 25 MG tablet     . Insulin Disposable Pump (V-GO 30) KIT 1 Device by Does not apply route daily. 90 kit 3  . losartan-hydrochlorothiazide (HYZAAR) 100-12.5 MG per tablet Take 1 tablet by mouth daily. 90 tablet 3  . medroxyPROGESTERone (PROVERA) 5 MG tablet Take 5 mg by mouth daily.    Marland Kitchen  metFORMIN (GLUCOPHAGE) 1000 MG tablet Take 1 tablet (1,000 mg total) by mouth at bedtime. 90 tablet 3  . metoprolol succinate (TOPROL-XL) 100 MG 24 hr tablet TAKE 1 TABLET DAILY WITH/OR IMMEDIATELY FOLLOWING A MEAL 90 tablet 1  . montelukast (SINGULAIR) 10 MG tablet TAKE 1 TABLET AT BEDTIME.  "OV NEEDED FOR ADDITIONAL REFILLS" 30 tablet 0  . Multiple Vitamin (MULTIVITAMIN) tablet Take 1 tablet by mouth daily.    . OSPHENA 60 MG TABS     . rOPINIRole (REQUIP) 2 MG tablet TAKE ONE-HALF TABLET (1 MG) AT BEDTIME 30 tablet 4  . telmisartan (MICARDIS) 80 MG tablet     . Travoprost, BAK Free, (TRAVATAN) 0.004 % SOLN ophthalmic solution Place 1 drop into both eyes at bedtime.     Marland Kitchen zolpidem (AMBIEN) 10 MG tablet Take 1 tablet (10 mg total) by mouth at bedtime as needed for sleep. 90 tablet 1   No current facility-administered medications on file prior to visit.     Allergies  Allergen Reactions   . Ace Inhibitors Cough    Family History  Problem Relation Age of Onset  . Cancer Father     colon  . Cancer Mother     per patient stomach  . Diabetes Maternal Grandmother   . Mental illness Sister   . Heart disease Brother     BP 112/72   Pulse 65   Ht '5\' 4"'  (1.626 m)   Wt 206 lb (93.4 kg)   LMP 01/19/2011   SpO2 98%   BMI 35.36 kg/m    Review of Systems She denies hypoglycemia.      Objective:   Physical Exam VITAL SIGNS:  See vs page.  GENERAL: no distress.  Pulses: dorsalis pedis intact bilat.   MSK: no deformity of the feet. CV: no leg edema.  Skin:  no ulcer on the feet.  normal color and temp on the feet. Neuro: sensation is intact to touch on the feet.   A1c=8.0%    Assessment & Plan:  Insulin-requiring type 2 DM: she needs increased rx.   Patient is advised the following: Patient Instructions  check your blood sugar twice a day.  vary the time of day when you check, between before the 3 meals, and at bedtime.  also check if you have symptoms of your blood sugar being too high or too low.  please keep a record of the readings and bring it to your next appointment here.  You can write it on any piece of paper.  please call us sooner if your blood sugar goes below 70, or if you have a lot of readings over 200.   Please come back for a follow-up appointment in 2 months.  Please increase clicks to breakfast:3 lunch:3 and supper:8 clicks.

## 2016-07-12 ENCOUNTER — Encounter: Payer: Self-pay | Admitting: Endocrinology

## 2016-07-12 LAB — HM DIABETES EYE EXAM

## 2016-08-22 ENCOUNTER — Other Ambulatory Visit: Payer: Self-pay | Admitting: Endocrinology

## 2016-08-30 ENCOUNTER — Ambulatory Visit (INDEPENDENT_AMBULATORY_CARE_PROVIDER_SITE_OTHER): Payer: 59 | Admitting: Endocrinology

## 2016-08-30 ENCOUNTER — Encounter: Payer: Self-pay | Admitting: Endocrinology

## 2016-08-30 VITALS — BP 128/76 | HR 69 | Ht 64.0 in | Wt 209.0 lb

## 2016-08-30 DIAGNOSIS — E0800 Diabetes mellitus due to underlying condition with hyperosmolarity without nonketotic hyperglycemic-hyperosmolar coma (NKHHC): Secondary | ICD-10-CM

## 2016-08-30 LAB — POCT GLYCOSYLATED HEMOGLOBIN (HGB A1C): HEMOGLOBIN A1C: 7.6

## 2016-08-30 MED ORDER — INSULIN REGULAR HUMAN 100 UNIT/ML IJ SOLN: INTRAMUSCULAR | 11 refills | Status: DC

## 2016-08-30 MED ORDER — V-GO 30 KIT: PACK | 11 refills | Status: DC

## 2016-08-30 NOTE — Progress Notes (Signed)
Subjective:    Patient ID: Heidi Howell, female    DOB: Sep 05, 1955, 61 y.o.   MRN: [id number]  HPI Pt returns for f/u of diabetes mellitus: DM type: Insulin-requiring type 2. Dx'ed: 2007.  Complications: none.  Therapy: insulin since 2014, metformin, and jardiance.   GDM: never DKA: never Severe hypoglycemia: never. Pancreatitis: never.  Other: she is pursuing weight-loss surgery; she started  V-GO-30 pump in early 2017. Interval history: .  She takes these clicks breakfast:2, lunch:2, and supper:7 (1 click is 2 units).  no cbg record, but states cbg's vary from 94-200's.  It is highest at lunch.   Past Medical History:  Diagnosis Date  . Anemia   . Bartholin gland cyst 06/04/2000  . Cyst   . Diabetes mellitus   . FH: colon cancer   . Fibroids   . Glaucoma   . H/O dysmenorrhea 06/04/2000  . H/O fatigue   . H/O menorrhagia 2001  . H/O: obesity   . Hypertension   . Infertility, female   . Menopausal symptoms   . Varicose vein   . Yeast infection     Past Surgical History:  Procedure Laterality Date  . [place] GLAND CYST EXCISION  2010  . CESAREAN SECTION  [id number]  . DILATION AND CURETTAGE OF UTERUS  1989  . TUBAL LIGATION  1996   bilateral    Social History   Social History  . Marital status: Married    Spouse name: Madelaine Etienne  . Number of children: N/A  . Years of education: N/A   Occupational History  . Not on file.   Social History Main Topics  . Smoking status: Never Smoker  . Smokeless tobacco: Never Used  . Alcohol use No  . Drug use: No  . Sexual activity: Yes    Partners: Male     Comment: number of sex partners in the last 12 months 1   Other Topics Concern  . Not on file   Social History Narrative   Exercise walk 2-3 times for 30-40 minutes    Current Outpatient Prescriptions on File Prior to Visit  Medication Sig Dispense Refill  . ADVAIR DISKUS 100-50 MCG/DOSE AEPB INHALE 1 PUFF INTO THE LUNGS TWICE A DAY 1 each 2  .  amLODipine (NORVASC) 5 MG tablet     . aspirin 81 MG tablet Take 81 mg by mouth daily.    Marland Kitchen BYSTOLIC 20 MG TABS     . carvedilol (COREG) 25 MG tablet     . cyclobenzaprine (FLEXERIL) 5 MG tablet TAKE 1 TABLET TWICE A DAY AS NEEDED FOR MUSCLE SPASM 180 tablet 1  . Empagliflozin (JARDIANCE PO) Take by mouth.    . estrogens, conjugated, (PREMARIN) 0.3 MG tablet Take 0.3 mg by mouth daily. Take daily for 21 days then do not take for 7 days.    . Fluticasone-Salmeterol (ADVAIR) 100-50 MCG/DOSE AEPB Inhale 1 puff into the lungs 2 (two) times daily. 3 each 0  . glucose blood (ONE TOUCH ULTRA TEST) test strip Use as instructed to check sugar three times daily Dx: E11.9 300 each 3  . hydrochlorothiazide (HYDRODIURIL) 25 MG tablet     . losartan-hydrochlorothiazide (HYZAAR) 100-12.5 MG per tablet Take 1 tablet by mouth daily. 90 tablet 3  . medroxyPROGESTERone (PROVERA) 5 MG tablet Take 5 mg by mouth daily.    . metFORMIN (GLUCOPHAGE) 1000 MG tablet Take 1 tablet (1,000 mg total) by mouth at bedtime. 90 tablet 3  .  metoprolol succinate (TOPROL-XL) 100 MG 24 hr tablet TAKE 1 TABLET DAILY WITH/OR IMMEDIATELY FOLLOWING A MEAL 90 tablet 1  . montelukast (SINGULAIR) 10 MG tablet TAKE 1 TABLET AT BEDTIME.  "OV NEEDED FOR ADDITIONAL REFILLS" 30 tablet 0  . Multiple Vitamin (MULTIVITAMIN) tablet Take 1 tablet by mouth daily.    . OSPHENA 60 MG TABS     . rOPINIRole (REQUIP) 2 MG tablet TAKE ONE-HALF TABLET (1 MG) AT BEDTIME 30 tablet 4  . telmisartan (MICARDIS) 80 MG tablet     . Travoprost, BAK Free, (TRAVATAN) 0.004 % SOLN ophthalmic solution Place 1 drop into both eyes at bedtime.     Marland Kitchen zolpidem (AMBIEN) 10 MG tablet Take 1 tablet (10 mg total) by mouth at bedtime as needed for sleep. 90 tablet 1   No current facility-administered medications on file prior to visit.     Allergies  Allergen Reactions  . Ace Inhibitors Cough    Family History  Problem Relation Age of Onset  . Cancer Father     colon    . Cancer Mother     per patient stomach  . Diabetes Maternal Grandmother   . Mental illness Sister   . Heart disease Brother     BP 128/76   Pulse 69   Ht 5\' 4"  (1.626 m)   Wt 209 lb (94.8 kg)   LMP 01/19/2011   SpO2 98%   BMI 35.87 kg/m    Review of Systems She denies hypoglycemia.      Objective:   Physical Exam VITAL SIGNS:  See vs page.  GENERAL: no distress.  Pulses: dorsalis pedis intact bilat.   MSK: no deformity of the feet. CV: no leg edema.  Skin:  no ulcer on the feet.  normal color and temp on the feet. Neuro: sensation is intact to touch on the feet.   a1c=7.6%    Assessment & Plan:  Insulin-requiring type 2 she needs increased rx.   Patient Instructions  check your blood sugar twice a day.  vary the time of day when you check, between before the 3 meals, and at bedtime.  also check if you have symptoms of your blood sugar being too high or too low.  please keep a record of the readings and bring it to your next appointment here.  You can write it on any piece of paper.  please call us sooner if your blood sugar goes below 70, or if you have a lot of readings over 200.   Please come back for a follow-up appointment in 3-4 months.  I have sent a prescription to your pharmacy, for the V-GO, and insulin, to see if it is cheaper that way.  Please increase clicks to breakfast:4 lunch:3 and supper:8 clicks.

## 2016-08-30 NOTE — Patient Instructions (Addendum)
check your blood sugar twice a day.  vary the time of day when you check, between before the 3 meals, and at bedtime.  also check if you have symptoms of your blood sugar being too high or too low.  please keep a record of the readings and bring it to your next appointment here.  You can write it on any piece of paper.  please call us sooner if your blood sugar goes below 70, or if you have a lot of readings over 200.   Please come back for a follow-up appointment in 3-4 months.  I have sent a prescription to your pharmacy, for the V-GO, and insulin, to see if it is cheaper that way.  Please increase clicks to breakfast:4 lunch:3 and supper:8 clicks.

## 2016-10-10 DIAGNOSIS — H2513 Age-related nuclear cataract, bilateral: Secondary | ICD-10-CM | POA: Insufficient documentation

## 2016-11-16 ENCOUNTER — Ambulatory Visit: Payer: 59 | Admitting: Family Medicine

## 2016-11-20 ENCOUNTER — Encounter: Payer: Self-pay | Admitting: Family Medicine

## 2016-11-20 ENCOUNTER — Ambulatory Visit (INDEPENDENT_AMBULATORY_CARE_PROVIDER_SITE_OTHER): Payer: 59 | Admitting: Family Medicine

## 2016-11-20 VITALS — BP 115/69 | HR 73 | Temp 98.7°F | Resp 17 | Ht 65.0 in | Wt 206.0 lb

## 2016-11-20 DIAGNOSIS — I10 Essential (primary) hypertension: Secondary | ICD-10-CM

## 2016-11-20 DIAGNOSIS — Z23 Encounter for immunization: Secondary | ICD-10-CM | POA: Diagnosis not present

## 2016-11-20 DIAGNOSIS — Z1159 Encounter for screening for other viral diseases: Secondary | ICD-10-CM | POA: Diagnosis not present

## 2016-11-20 DIAGNOSIS — Z6834 Body mass index (BMI) 34.0-34.9, adult: Secondary | ICD-10-CM

## 2016-11-20 DIAGNOSIS — E1165 Type 2 diabetes mellitus with hyperglycemia: Secondary | ICD-10-CM | POA: Diagnosis not present

## 2016-11-20 DIAGNOSIS — Z794 Long term (current) use of insulin: Secondary | ICD-10-CM | POA: Diagnosis not present

## 2016-11-20 DIAGNOSIS — E6609 Other obesity due to excess calories: Secondary | ICD-10-CM

## 2016-11-20 DIAGNOSIS — R5383 Other fatigue: Secondary | ICD-10-CM | POA: Diagnosis not present

## 2016-11-20 NOTE — Progress Notes (Signed)
Chief Complaint  Patient presents with  . Establish Care    HPI   Patient is here to establish care with new provider This is a former patient of Dr. Tania Ade  History of anemia and fatigue She has a history of anemia She reports some fatigue but no heat or cold intolerance She is eating iron rich foods Lab Results  Component Value Date   WBC 18.1 (H) 04/15/2014   HGB 12.2 04/15/2014   HCT 35.9 (L) 04/15/2014   MCV 81.4 04/15/2014   PLT 289 04/15/2014   Diabetes Pt sees Endocrinology She has an insulin pump and is on metformin She has an appt 12/2016 for follow up  She reports that her sugars are in the 100s and denies hypoglycemia She denies neuropathy She is getting treated for glaucoma No new vision changes Her husband had questions as to whether or not she was "really even diabetic because her diabetes is not that bad". The patient feels like she is on a lot of medications and wishes she could get off some of these medications.    Hypertension Pt reports that she has been having good blood pressures and is complaint with her medications.  She tries to stick to a low sodium diet. She denies chest pains, palpitations, shortness of breath or lower extremity edema.  BP Readings from Last 3 Encounters:  11/20/16 115/69  08/30/16 128/76  07/02/16 112/72   Obesity She works as a Medical illustrator and stands long hours. She reports that she does not have much time to exercise. Her husband states that he is the cook at home but that the patient eats whatever she wants.  Body mass index is 34.28 kg/m.  She reports that  Past Medical History:  Diagnosis Date  . Anemia   . Bartholin gland cyst 06/04/2000  . Cyst   . Diabetes mellitus   . FH: colon cancer   . Fibroids   . Glaucoma   . H/O dysmenorrhea 06/04/2000  . H/O fatigue   . H/O menorrhagia 2001  . H/O: obesity   . Hypertension   . Infertility, female   . Menopausal symptoms   . Varicose vein    . Yeast infection     Current Outpatient Prescriptions  Medication Sig Dispense Refill  . ADVAIR DISKUS 100-50 MCG/DOSE AEPB INHALE 1 PUFF INTO THE LUNGS TWICE A DAY 1 each 2  . amLODipine (NORVASC) 5 MG tablet     . aspirin 81 MG tablet Take 81 mg by mouth daily.    Marland Kitchen BYSTOLIC 20 MG TABS     . carvedilol (COREG) 25 MG tablet     . cyclobenzaprine (FLEXERIL) 5 MG tablet TAKE 1 TABLET TWICE A DAY AS NEEDED FOR MUSCLE SPASM 180 tablet 1  . Empagliflozin (JARDIANCE PO) Take by mouth.    . estrogens, conjugated, (PREMARIN) 0.3 MG tablet Take 0.3 mg by mouth daily. Take daily for 21 days then do not take for 7 days.    . Fluticasone-Salmeterol (ADVAIR) 100-50 MCG/DOSE AEPB Inhale 1 puff into the lungs 2 (two) times daily. 3 each 0  . glucose blood (ONE TOUCH ULTRA TEST) test strip Use as instructed to check sugar three times daily Dx: E11.9 300 each 3  . hydrochlorothiazide (HYDRODIURIL) 25 MG tablet     . Insulin Disposable Pump (V-GO 30) KIT USE 1 DEVICE DAILY 30 kit 11  . insulin regular (HUMULIN R) 250 units/2.29m (100 units/mL) injection For use in pump, total  of 90 units per day 30 mL 11  . losartan-hydrochlorothiazide (HYZAAR) 100-12.5 MG per tablet Take 1 tablet by mouth daily. 90 tablet 3  . medroxyPROGESTERone (PROVERA) 5 MG tablet Take 5 mg by mouth daily.    . metFORMIN (GLUCOPHAGE) 1000 MG tablet Take 1 tablet (1,000 mg total) by mouth at bedtime. 90 tablet 3  . metoprolol succinate (TOPROL-XL) 100 MG 24 hr tablet TAKE 1 TABLET DAILY WITH/OR IMMEDIATELY FOLLOWING A MEAL 90 tablet 1  . montelukast (SINGULAIR) 10 MG tablet TAKE 1 TABLET AT BEDTIME.  "OV NEEDED FOR ADDITIONAL REFILLS" 30 tablet 0  . Multiple Vitamin (MULTIVITAMIN) tablet Take 1 tablet by mouth daily.    . OSPHENA 60 MG TABS     . rOPINIRole (REQUIP) 2 MG tablet TAKE ONE-HALF TABLET (1 MG) AT BEDTIME 30 tablet 4  . telmisartan (MICARDIS) 80 MG tablet     . Travoprost, BAK Free, (TRAVATAN) 0.004 % SOLN ophthalmic  solution Place 1 drop into both eyes at bedtime.     Marland Kitchen zolpidem (AMBIEN) 10 MG tablet Take 1 tablet (10 mg total) by mouth at bedtime as needed for sleep. 90 tablet 1   No current facility-administered medications for this visit.     Allergies:  Allergies  Allergen Reactions  . Ace Inhibitors Cough    Past Surgical History:  Procedure Laterality Date  . [place] GLAND CYST EXCISION  2010  . CESAREAN SECTION  [id number]  . DILATION AND CURETTAGE OF UTERUS  1989  . TUBAL LIGATION  1996   bilateral    Social History   Social History  . Marital status: Married    Spouse name: Madelaine Etienne  . Number of children: N/A  . Years of education: N/A   Social History Main Topics  . Smoking status: Never Smoker  . Smokeless tobacco: Never Used  . Alcohol use No  . Drug use: No  . Sexual activity: Yes    Partners: Male     Comment: number of sex partners in the last 12 months 1   Other Topics Concern  . None   Social History Narrative   Exercise walk 2-3 times for 30-40 minutes    ROS Review of Systems See HPI Constitution: No fevers or chills No malaise No diaphoresis Skin: No rash or itching Eyes: no blurry vision, no double vision GU: no dysuria or hematuria Neuro: no dizziness or headaches  Objective: Vitals:   11/20/16 0829  BP: 115/69  Pulse: 73  Resp: 17  Temp: 98.7 F (37.1 C)  TempSrc: Oral  SpO2: 98%  Weight: 206 lb (93.4 kg)  Height: _0  (1.651 m)    Physical Exam  Constitutional: She is oriented to person, place, and time. She appears well-developed and well-nourished.  HENT:  Head: Normocephalic and atraumatic.  Right Ear: External ear normal.  Left Ear: External ear normal.  Nose: Nose normal.  Mouth/Throat: Oropharynx is clear and moist.  Eyes: Conjunctivae and EOM are normal.  Neck: Normal range of motion. No thyromegaly present.  Cardiovascular: Normal rate, regular rhythm and normal heart sounds.   Pulmonary/Chest: Effort normal  and breath sounds normal. No respiratory distress. She has no wheezes.  Abdominal: Soft. Bowel sounds are normal. She exhibits no distension. There is no tenderness. There is no guarding.  Neurological: She is alert and oriented to person, place, and time.  Psychiatric: She has a normal mood and affect. Her behavior is normal. Judgment and thought content normal.    Assessment  and Plan Tanelle was seen today for establish care.  Diagnoses and all orders for this visit:  Type 2 diabetes mellitus with hyperglycemia, with long-term current use of insulin ([place])- diabetes appears well controlled Discussed with patient and her husband that she is diabetic and reviewed how the diagnosis was made Discussed that she should continue her diabetes medications -     Cancel: POCT glycosylated hemoglobin (Hb A1C) -     Lipid panel -     Comprehensive metabolic panel  Essential hypertension-bp at range Discussed that she could continue  -     Comprehensive metabolic panel  Class 1 obesity due to excess calories without serious comorbidity with body mass index (BMI) of 34.0 to 34.9 in adult- discussed diet and exercise -     TSH  Fatigue, unspecified type- will check her cbc Advised increased exercise -     CBC  Need for hepatitis C screening test -     HCV Ab w/Rflx to Verification  Need for Tdap vaccination  Other orders -     Tdap vaccine greater than or equal to 7yo IM -     Cancel: GC/Chlamydia Probe Amp   A total of 40 minutes were spent face-to-face with the patient during this encounter and over half of that time was spent on counseling and coordination of care.   [place]

## 2016-11-20 NOTE — Patient Instructions (Addendum)
   IF you received an x-ray today, you will receive an invoice from [facility]. Please contact [facility] at [phone] with questions or concerns regarding your invoice.   IF you received labwork today, you will receive an invoice from [facility]. Please contact [facility] at [phone] with questions or concerns regarding your invoice.   Our billing staff will not be able to assist you with questions regarding bills from these companies.  You will be contacted with the lab results as soon as they are available. The fastest way to get your results is to activate your My Chart account. Instructions are located on the last page of this paperwork. If you have not heard from us regarding the results in 2 weeks, please contact this office.     Exercising to Lose Weight Exercising can help you to lose weight. In order to lose weight through exercise, you need to do vigorous-intensity exercise. You can tell that you are exercising with vigorous intensity if you are breathing very hard and fast and cannot hold a conversation while exercising. Moderate-intensity exercise helps to maintain your current weight. You can tell that you are exercising at a moderate level if you have a higher heart rate and faster breathing, but you are still able to hold a conversation. How often should I exercise? Choose an activity that you enjoy and set realistic goals. Your health care provider can help you to make an activity plan that works for you. Exercise regularly as directed by your health care provider. This may include:  Doing resistance training twice each week, such as: ? Push-ups. ? Sit-ups. ? Lifting weights. ? Using resistance bands.  Doing a given intensity of exercise for a given amount of time. Choose from these options: ? 150 minutes of moderate-intensity exercise every week. ? 75 minutes of vigorous-intensity exercise every week. ? A mix of moderate-intensity and  vigorous-intensity exercise every week.  Children, pregnant women, people who are out of shape, people who are overweight, and older adults may need to consult a health care provider for individual recommendations. If you have any sort of medical condition, be sure to consult your health care provider before starting a new exercise program. What are some activities that can help me to lose weight?  Walking at a rate of at least 4.5 miles an hour.  Jogging or running at a rate of 5 miles per hour.  Biking at a rate of at least 10 miles per hour.  Lap swimming.  Roller-skating or in-line skating.  Cross-country skiing.  Vigorous competitive sports, such as football, basketball, and soccer.  Jumping rope.  Aerobic dancing. How can I be more active in my day-to-day activities?  Use the stairs instead of the elevator.  Take a walk during your lunch break.  If you drive, park your car farther away from work or school.  If you take public transportation, get off one stop early and walk the rest of the way.  Make all of your phone calls while standing up and walking around.  Get up, stretch, and walk around every 30 minutes throughout the day. What guidelines should I follow while exercising?  Do not exercise so much that you hurt yourself, feel dizzy, or get very short of breath.  Consult your health care provider prior to starting a new exercise program.  Wear comfortable clothes and shoes with good support.  Drink plenty of water while you exercise to prevent dehydration or heat stroke. Body water is   lost during exercise and must be replaced.  Work out until you breathe faster and your heart beats faster. This information is not intended to replace advice given to you by your health care provider. Make sure you discuss any questions you have with your health care provider. Document Released: 05/26/2010 Document Revised: 09/29/2015 Document Reviewed: 09/24/2013 Elsevier  Interactive Patient Education  2018 [employer] Inc.  

## 2016-11-21 LAB — COMPREHENSIVE METABOLIC PANEL
ALK PHOS: 93 IU/L (ref 39–117)
ALT: 17 IU/L (ref 0–32)
AST: 14 IU/L (ref 0–40)
Albumin/Globulin Ratio: 1.5 (ref 1.2–2.2)
Albumin: 4 g/dL (ref 3.5–5.5)
BILIRUBIN TOTAL: 0.3 mg/dL (ref 0.0–1.2)
BUN/Creatinine Ratio: 15 (ref 9–23)
BUN: 15 mg/dL (ref 6–24)
CHLORIDE: 105 mmol/L (ref 96–106)
CO2: 23 mmol/L (ref 20–29)
Calcium: 9.3 mg/dL (ref 8.7–10.2)
Creatinine, Ser: 1.01 mg/dL — ABNORMAL HIGH (ref 0.57–1.00)
GFR calc Af Amer: 70 mL/min/{1.73_m2} (ref >59)
GFR calc non Af Amer: 61 mL/min/{1.73_m2} (ref >59)
GLUCOSE: 139 mg/dL — AB (ref 65–99)
Globulin, Total: 2.6 g/dL (ref 1.5–4.5)
Potassium: 4 mmol/L (ref 3.5–5.2)
Sodium: 142 mmol/L (ref 134–144)
Total Protein: 6.6 g/dL (ref 6.0–8.5)

## 2016-11-21 LAB — CBC
HEMOGLOBIN: 11.6 g/dL (ref 11.1–15.9)
Hematocrit: 36.3 % (ref 34.0–46.6)
MCH: 26.1 pg — ABNORMAL LOW (ref 26.6–33.0)
MCHC: 32 g/dL (ref 31.5–35.7)
MCV: 82 fL (ref 79–97)
Platelets: 279 10*3/uL (ref 150–379)
RBC: 4.44 x10E6/uL (ref 3.77–5.28)
RDW: 14.5 % (ref 12.3–15.4)
WBC: 8.8 10*3/uL (ref 3.4–10.8)

## 2016-11-21 LAB — HCV INTERPRETATION

## 2016-11-21 LAB — LIPID PANEL
CHOL/HDL RATIO: 2.8 ratio (ref 0.0–4.4)
Cholesterol, Total: 147 mg/dL (ref 100–199)
HDL: 52 mg/dL (ref >39)
LDL Calculated: 75 mg/dL (ref 0–99)
Triglycerides: 101 mg/dL (ref 0–149)
VLDL CHOLESTEROL CAL: 20 mg/dL (ref 5–40)

## 2016-11-21 LAB — HCV AB W/RFLX TO VERIFICATION: HCV Ab: <0.1 s/co ratio (ref 0.0–0.9)

## 2016-11-21 LAB — TSH: TSH: 1.17 u[IU]/mL (ref 0.450–4.500)

## 2016-12-31 ENCOUNTER — Encounter: Payer: Self-pay | Admitting: Endocrinology

## 2016-12-31 ENCOUNTER — Ambulatory Visit (INDEPENDENT_AMBULATORY_CARE_PROVIDER_SITE_OTHER): Payer: 59 | Admitting: Endocrinology

## 2016-12-31 VITALS — BP 120/70 | HR 71 | Ht 65.0 in | Wt 206.0 lb

## 2016-12-31 DIAGNOSIS — Z794 Long term (current) use of insulin: Secondary | ICD-10-CM

## 2016-12-31 DIAGNOSIS — E1165 Type 2 diabetes mellitus with hyperglycemia: Secondary | ICD-10-CM | POA: Diagnosis not present

## 2016-12-31 LAB — POCT GLYCOSYLATED HEMOGLOBIN (HGB A1C): HEMOGLOBIN A1C: 7.8

## 2016-12-31 MED ORDER — FREESTYLE LIBRE SENSOR SYSTEM MISC: 1.0000 | 11 refills | Status: DC

## 2016-12-31 MED ORDER — FREESTYLE LIBRE READER DEVI: 1.0000 | Freq: Once | 0 refills | Status: DC

## 2016-12-31 NOTE — Patient Instructions (Addendum)
check your blood sugar twice a day.  vary the time of day when you check, between before the 3 meals, and at bedtime.  also check if you have symptoms of your blood sugar being too high or too low.  please keep a record of the readings and bring it to your next appointment here.  You can write it on any piece of paper.  please call us sooner if your blood sugar goes below 70, or if you have a lot of readings over 200.   Please come back for a follow-up appointment in 3-4 months.  Please increase clicks to breakfast:5 lunch:3 and supper:8 clicks.   I have sent a prescription to your pharmacy, for the freestyle "libre."  Call if you need to see Vaughan Basta, to learn more.

## 2016-12-31 NOTE — Progress Notes (Signed)
Subjective:    Patient ID: Heidi Howell, female    DOB: Oct 15, 1955, 61 y.o.   MRN: [id number]  HPI Pt returns for f/u of diabetes mellitus: DM type: Insulin-requiring type 2. Dx'ed: 2007.  Complications: none.  Therapy: insulin since 2014, metformin, and jardiance.   GDM: never DKA: never Severe hypoglycemia: never. Pancreatitis: never.  Other: she is pursuing weight-loss surgery; she started  V-GO-30 pump in early 2017. Interval history: .  She takes these clicks: breakfast:4, lunch:4 and supper:8 (1 click is 2 units).  no cbg record, but states cbg's vary from 99-200.  It is still highest at lunch. Past Medical History:  Diagnosis Date  . Anemia   . Bartholin gland cyst 06/04/2000  . Cyst   . Diabetes mellitus   . FH: colon cancer   . Fibroids   . Glaucoma   . H/O dysmenorrhea 06/04/2000  . H/O fatigue   . H/O menorrhagia 2001  . H/O: obesity   . Hypertension   . Infertility, female   . Menopausal symptoms   . Varicose vein   . Yeast infection     Past Surgical History:  Procedure Laterality Date  . [place] GLAND CYST EXCISION  2010  . CESAREAN SECTION  [id number]  . DILATION AND CURETTAGE OF UTERUS  1989  . TUBAL LIGATION  1996   bilateral    Social History   Social History  . Marital status: Married    Spouse name: Madelaine Etienne  . Number of children: N/A  . Years of education: N/A   Occupational History  . Not on file.   Social History Main Topics  . Smoking status: Never Smoker  . Smokeless tobacco: Never Used  . Alcohol use No  . Drug use: No  . Sexual activity: Yes    Partners: Male     Comment: number of sex partners in the last 12 months 1   Other Topics Concern  . Not on file   Social History Narrative   Exercise walk 2-3 times for 30-40 minutes    Current Outpatient Prescriptions on File Prior to Visit  Medication Sig Dispense Refill  . ADVAIR DISKUS 100-50 MCG/DOSE AEPB INHALE 1 PUFF INTO THE LUNGS TWICE A DAY 1 each 2  .  amLODipine (NORVASC) 5 MG tablet     . aspirin 81 MG tablet Take 81 mg by mouth daily.    Marland Kitchen BYSTOLIC 20 MG TABS     . carvedilol (COREG) 25 MG tablet     . cyclobenzaprine (FLEXERIL) 5 MG tablet TAKE 1 TABLET TWICE A DAY AS NEEDED FOR MUSCLE SPASM 180 tablet 1  . Empagliflozin (JARDIANCE PO) Take by mouth.    . estrogens, conjugated, (PREMARIN) 0.3 MG tablet Take 0.3 mg by mouth daily. Take daily for 21 days then do not take for 7 days.    . Fluticasone-Salmeterol (ADVAIR) 100-50 MCG/DOSE AEPB Inhale 1 puff into the lungs 2 (two) times daily. 3 each 0  . glucose blood (ONE TOUCH ULTRA TEST) test strip Use as instructed to check sugar three times daily Dx: E11.9 300 each 3  . hydrochlorothiazide (HYDRODIURIL) 25 MG tablet     . Insulin Disposable Pump (V-GO 30) KIT USE 1 DEVICE DAILY 30 kit 11  . insulin regular (HUMULIN R) 250 units/2.45m (100 units/mL) injection For use in pump, total of 90 units per day 30 mL 11  . losartan-hydrochlorothiazide (HYZAAR) 100-12.5 MG per tablet Take 1 tablet by mouth daily. 90 tablet  3  . medroxyPROGESTERone (PROVERA) 5 MG tablet Take 5 mg by mouth daily.    . metFORMIN (GLUCOPHAGE) 1000 MG tablet Take 1 tablet (1,000 mg total) by mouth at bedtime. 90 tablet 3  . metoprolol succinate (TOPROL-XL) 100 MG 24 hr tablet TAKE 1 TABLET DAILY WITH/OR IMMEDIATELY FOLLOWING A MEAL 90 tablet 1  . montelukast (SINGULAIR) 10 MG tablet TAKE 1 TABLET AT BEDTIME.  "OV NEEDED FOR ADDITIONAL REFILLS" 30 tablet 0  . Multiple Vitamin (MULTIVITAMIN) tablet Take 1 tablet by mouth daily.    . OSPHENA 60 MG TABS     . telmisartan (MICARDIS) 80 MG tablet     . Travoprost, BAK Free, (TRAVATAN) 0.004 % SOLN ophthalmic solution Place 1 drop into both eyes at bedtime.     Marland Kitchen rOPINIRole (REQUIP) 2 MG tablet TAKE ONE-HALF TABLET (1 MG) AT BEDTIME (Patient not taking: Reported on 12/31/2016) 30 tablet 4  . zolpidem (AMBIEN) 10 MG tablet Take 1 tablet (10 mg total) by mouth at bedtime as needed  for sleep. 90 tablet 1   No current facility-administered medications on file prior to visit.     Allergies  Allergen Reactions  . Ace Inhibitors Cough    Family History  Problem Relation Age of Onset  . Cancer Father        colon  . Cancer Mother        per patient stomach  . Diabetes Maternal Grandmother   . Mental illness Sister   . Heart disease Brother     BP 120/70   Pulse 71   Ht '5\' 5"'  (1.651 m)   Wt 206 lb (93.4 kg)   LMP 01/19/2011   SpO2 97%   BMI 34.28 kg/m    Review of Systems She denies hypoglycemia    Objective:   Physical Exam VITAL SIGNS:  See vs page GENERAL: no distress Pulses: foot pulses are intact bilaterally.   MSK: no deformity of the feet or ankles.  CV: trace bilat edema of the legs or ankles Skin:  no ulcer on the feet or ankles.  normal color and temp on the feet and ankles Neuro: sensation is intact to touch on the feet and ankles.     A1c=7.8%    Assessment & Plan:  Insulin-requiring type 2 DM: she needs increased rx  Patient Instructions  check your blood sugar twice a day.  vary the time of day when you check, between before the 3 meals, and at bedtime.  also check if you have symptoms of your blood sugar being too high or too low.  please keep a record of the readings and bring it to your next appointment here.  You can write it on any piece of paper.  please call us sooner if your blood sugar goes below 70, or if you have a lot of readings over 200.   Please come back for a follow-up appointment in 3-4 months.  Please increase clicks to breakfast:5 lunch:3 and supper:8 clicks.   I have sent a prescription to your pharmacy, for the freestyle "libre."  Call if you need to see Vaughan Basta, to learn more.

## 2017-02-01 ENCOUNTER — Other Ambulatory Visit: Payer: Self-pay | Admitting: Endocrinology

## 2017-02-20 NOTE — Progress Notes (Signed)
Chief Complaint  Patient presents with  . Diabetes    HPI   Diabetes visit 11/20/16 Pt sees Endocrinology She has an insulin pump and is on metformin She has an appt 12/2016 for follow up  She reports that her sugars are in the 100s and denies hypoglycemia She denies neuropathy She is getting treated for glaucoma No new vision changes Her husband had questions as to whether or not she was "really even diabetic because her diabetes is not that bad". The patient feels like she is on a lot of medications and wishes she could get off some of these medications.   Lab Results  Component Value Date   HGBA1C 7.8 12/31/2016    Diabetes Mellitus Type II, Follow-up 02/21/2017: Patient here for follow-up of Type 2 diabetes mellitus.  Current symptoms/problems include none and have been stable.   Known diabetic complications: glaucoma Cardiovascular risk factors: diabetes mellitus, dyslipidemia, hypertension, obesity (BMI >= 30 kg/m2) and sedentary lifestyle Current diabetic medications include insulin pump.   Eye exam current (within one year): yes Weight trend: stable Wt Readings from Last 3 Encounters:  02/21/17 205 lb 12.8 oz (93.4 kg)  12/31/16 206 lb (93.4 kg)  11/20/16 206 lb (93.4 kg)  Prior visit with dietician: no Current diet: in general, a "healthy" diet   Current exercise: none  Current monitoring regimen: home blood tests - daily Home blood sugar records: fasting range: 140-150s Any episodes of hypoglycemia? no  Is She on ACE inhibitor or angiotensin II receptor blocker?  Yes  losartan (Cozaar)  Lab Results  Component Value Date   HGBA1C 7.8 12/31/2016   Hypertension: Patient here for follow-up of elevated blood pressure. She is not exercising and is adherent to low salt diet.  Blood pressure is well controlled at home. Cardiac denies chest pain, chest pressure/discomfort, exertional chest pressure/discomfort, lower extremity edema and palpitations.  Cardiovascular  risk factors: diabetes mellitus, dyslipidemia, hypertension and obesity (BMI >= 30 kg/m2). Use of agents associated with hypertension: none.  BP Readings from Last 3 Encounters:  02/21/17 120/66  12/31/16 120/70  11/20/16 115/69    Obesity: Patient complains of obesity. Patient cites health as reasons for wanting to lose weight. She only sleeps 5 hours a day and works 7 days a week  She sticks to an ADA diet but is not exercising  Past Medical History:  Diagnosis Date  . Anemia   . Bartholin gland cyst 06/04/2000  . Cyst   . Diabetes mellitus   . FH: colon cancer   . Fibroids   . Glaucoma   . H/O dysmenorrhea 06/04/2000  . H/O fatigue   . H/O menorrhagia 2001  . H/O: obesity   . Hypertension   . Infertility, female   . Menopausal symptoms   . Varicose vein   . Yeast infection     Current Outpatient Prescriptions  Medication Sig Dispense Refill  . amLODipine (NORVASC) 5 MG tablet     . aspirin 81 MG tablet Take 81 mg by mouth daily.    . BYSTOLIC 20 MG TABS     . carvedilol (COREG) 25 MG tablet     . Continuous Blood Gluc Sensor (FREESTYLE LIBRE SENSOR SYSTEM) MISC 1 Device by Does not apply route once a week. 4 each 11  . cyclobenzaprine (FLEXERIL) 5 MG tablet TAKE 1 TABLET TWICE A DAY AS NEEDED FOR MUSCLE SPASM 180 tablet 1  . Empagliflozin (JARDIANCE PO) Take by mouth.    . estrogens, conjugated, (PREMARIN)   0.3 MG tablet Take 0.3 mg by mouth daily. Take daily for 21 days then do not take for 7 days.    . Fluticasone Furoate-Vilanterol (BREO ELLIPTA IN) Inhale into the lungs.    . Fluticasone-Salmeterol (ADVAIR) 100-50 MCG/DOSE AEPB Inhale 1 puff into the lungs 2 (two) times daily. 3 each 0  . hydrochlorothiazide (HYDRODIURIL) 25 MG tablet     . Insulin Disposable Pump (V-GO 30) KIT USE 1 DEVICE DAILY 30 kit 11  . insulin regular (HUMULIN R) 250 units/2.5mL (100 units/mL) injection For use in pump, total of 90 units per day 30 mL 11  . losartan-hydrochlorothiazide  (HYZAAR) 100-12.5 MG per tablet Take 1 tablet by mouth daily. 90 tablet 3  . medroxyPROGESTERone (PROVERA) 5 MG tablet Take 5 mg by mouth daily.    . metFORMIN (GLUCOPHAGE) 1000 MG tablet Take 1 tablet (1,000 mg total) by mouth at bedtime. 90 tablet 3  . metoprolol succinate (TOPROL-XL) 100 MG 24 hr tablet TAKE 1 TABLET DAILY WITH/OR IMMEDIATELY FOLLOWING A MEAL 90 tablet 1  . montelukast (SINGULAIR) 10 MG tablet TAKE 1 TABLET AT BEDTIME.  "OV NEEDED FOR ADDITIONAL REFILLS" 30 tablet 0  . Multiple Vitamin (MULTIVITAMIN) tablet Take 1 tablet by mouth daily.    . ONE TOUCH ULTRA TEST test strip USE TO CHECK SUGAR THREE TIMES A DAY AS INSTRUCTED 300 each 3  . OSPHENA 60 MG TABS     . telmisartan (MICARDIS) 80 MG tablet     . rOPINIRole (REQUIP) 2 MG tablet TAKE ONE-HALF TABLET (1 MG) AT BEDTIME (Patient not taking: Reported on 02/21/2017) 30 tablet 4  . Travoprost, BAK Free, (TRAVATAN) 0.004 % SOLN ophthalmic solution Place 1 drop into both eyes at bedtime.     . zolpidem (AMBIEN) 10 MG tablet Take 1 tablet (10 mg total) by mouth at bedtime as needed for sleep. 90 tablet 1   No current facility-administered medications for this visit.     Allergies:  Allergies  Allergen Reactions  . Ace Inhibitors Cough    Past Surgical History:  Procedure Laterality Date  . BARTHOLIN GLAND CYST EXCISION  2010  . CESAREAN SECTION  1990,1996  . DILATION AND CURETTAGE OF UTERUS  1989  . TUBAL LIGATION  1996   bilateral    Social History   Social History  . Marital status: Married    Spouse name: William Melton  . Number of children: N/A  . Years of education: N/A   Social History Main Topics  . Smoking status: Never Smoker  . Smokeless tobacco: Never Used  . Alcohol use No  . Drug use: No  . Sexual activity: Yes    Partners: Male     Comment: number of sex partners in the last 12 months 1   Other Topics Concern  . None   Social History Narrative   Exercise walk 2-3 times for 30-40  minutes    Review of Systems  Constitutional: Negative for chills and fever.  HENT: Negative for congestion and sore throat.   Respiratory: Negative for cough, shortness of breath, wheezing and stridor.   Cardiovascular: Negative for chest pain, palpitations and leg swelling.  Gastrointestinal: Negative for abdominal pain, nausea and vomiting.  Neurological: Negative for dizziness, tingling and headaches.   Objective: Vitals:   02/21/17 0853  BP: 120/66  Pulse: 61  Resp: 16  Temp: 98.9 F (37.2 C)  TempSrc: Oral  SpO2: 96%  Weight: 205 lb 12.8 oz (93.4 kg)  Height: 5'   4.5" (1.638 m)  Body mass index is 34.78 kg/m.  Diabetic Foot Exam - Simple   Simple Foot Form Diabetic Foot exam was performed with the following findings:  Yes 02/21/2017  8:55 AM  Visual Inspection No deformities, no ulcerations, no other skin breakdown bilaterally:  Yes Sensation Testing Intact to touch and monofilament testing bilaterally:  Yes Pulse Check Posterior Tibialis and Dorsalis pulse intact bilaterally:  Yes Comments     Physical Exam  Constitutional: She is oriented to person, place, and time. She appears well-developed and well-nourished.  HENT:  Head: Normocephalic and atraumatic.  Eyes: Conjunctivae and EOM are normal.  Neck: Normal range of motion. No thyromegaly present.  Cardiovascular: Normal rate, regular rhythm and normal heart sounds.   Pulmonary/Chest: Effort normal and breath sounds normal. No respiratory distress. She has no wheezes.  Neurological: She is alert and oriented to person, place, and time.  Psychiatric: She has a normal mood and affect. Her behavior is normal. Judgment and thought content normal.    Assessment and Plan [place] was seen today for diabetes.  Diagnoses and all orders for this visit:  Type 2 diabetes mellitus with hyperglycemia, with long-term current use of insulin ([place])- will check a1c next visit in one month  Need for prophylactic  vaccination and inoculation against influenza -     Flu Vaccine QUAD 36+ mos IM  Need for prophylactic vaccination against Streptococcus pneumoniae (pneumococcus)  Essential hypertension- bp at goal, goal is <130/80   Class 1 obesity due to excess calories with serious comorbidity and body mass index (BMI) of 34.0 to 34.9 in adult-  Unable to exercise which is holding pt back from weight loss She works 7 days a week and is only sleeping 5 hours a night which is also contributing  Other orders -     Cancel: Flu Vaccine QUAD 36+ mos IM -     Cancel: Pneumococcal conjugate vaccine 13-valent IM -     Cancel: HM Diabetes Foot Exam -     Pneumococcal polysaccharide vaccine 23-valent greater than or equal to 2yo subcutaneous/IM     Josemanuel Eakins A [employer]

## 2017-02-21 ENCOUNTER — Encounter: Payer: Self-pay | Admitting: Family Medicine

## 2017-02-21 ENCOUNTER — Ambulatory Visit (INDEPENDENT_AMBULATORY_CARE_PROVIDER_SITE_OTHER): Payer: 59 | Admitting: Family Medicine

## 2017-02-21 VITALS — BP 120/66 | HR 61 | Temp 98.9°F | Resp 16 | Ht 64.5 in | Wt 205.8 lb

## 2017-02-21 DIAGNOSIS — E1165 Type 2 diabetes mellitus with hyperglycemia: Secondary | ICD-10-CM

## 2017-02-21 DIAGNOSIS — Z794 Long term (current) use of insulin: Secondary | ICD-10-CM

## 2017-02-21 DIAGNOSIS — Z23 Encounter for immunization: Secondary | ICD-10-CM

## 2017-02-21 DIAGNOSIS — E6609 Other obesity due to excess calories: Secondary | ICD-10-CM | POA: Diagnosis not present

## 2017-02-21 DIAGNOSIS — I10 Essential (primary) hypertension: Secondary | ICD-10-CM | POA: Diagnosis not present

## 2017-02-21 DIAGNOSIS — Z6834 Body mass index (BMI) 34.0-34.9, adult: Secondary | ICD-10-CM

## 2017-02-21 NOTE — Patient Instructions (Addendum)
1. Take a drug holiday from amlodipine (stop taking amlodipine for 2 weeks)  2. Stop in for a fast track bp check 3. Return to clinic in one month for physical exam     IF you received an x-ray today, you will receive an invoice from [facility]. Please contact [facility] at [phone] with questions or concerns regarding your invoice.   IF you received labwork today, you will receive an invoice from [facility]. Please contact [facility] at [phone] with questions or concerns regarding your invoice.   Our billing staff will not be able to assist you with questions regarding bills from these companies.  You will be contacted with the lab results as soon as they are available. The fastest way to get your results is to activate your My Chart account. Instructions are located on the last page of this paperwork. If you have not heard from [place] regarding the results in 2 weeks, please contact this office.

## 2017-03-09 IMAGING — CR DG KNEE COMPLETE 4+V*R*
4 series · 4 of 4 positions shown · non-contrast
Comparison: None.

CLINICAL DATA: Knee pain.  No prior .

EXAM:
RIGHT KNEE - COMPLETE 4+ VIEW

[w knee ap right]
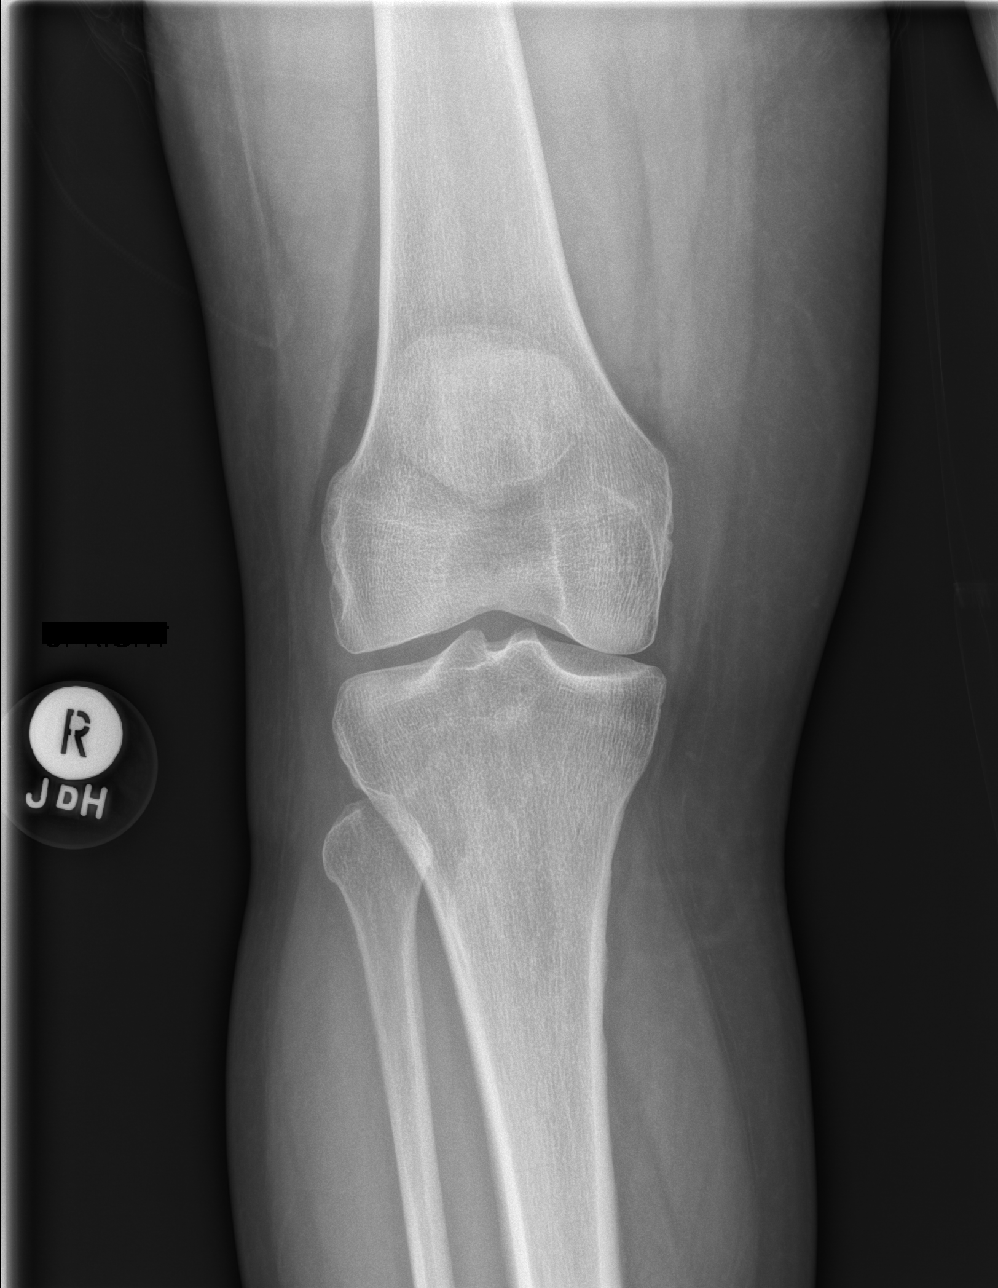

[w knee lat right]
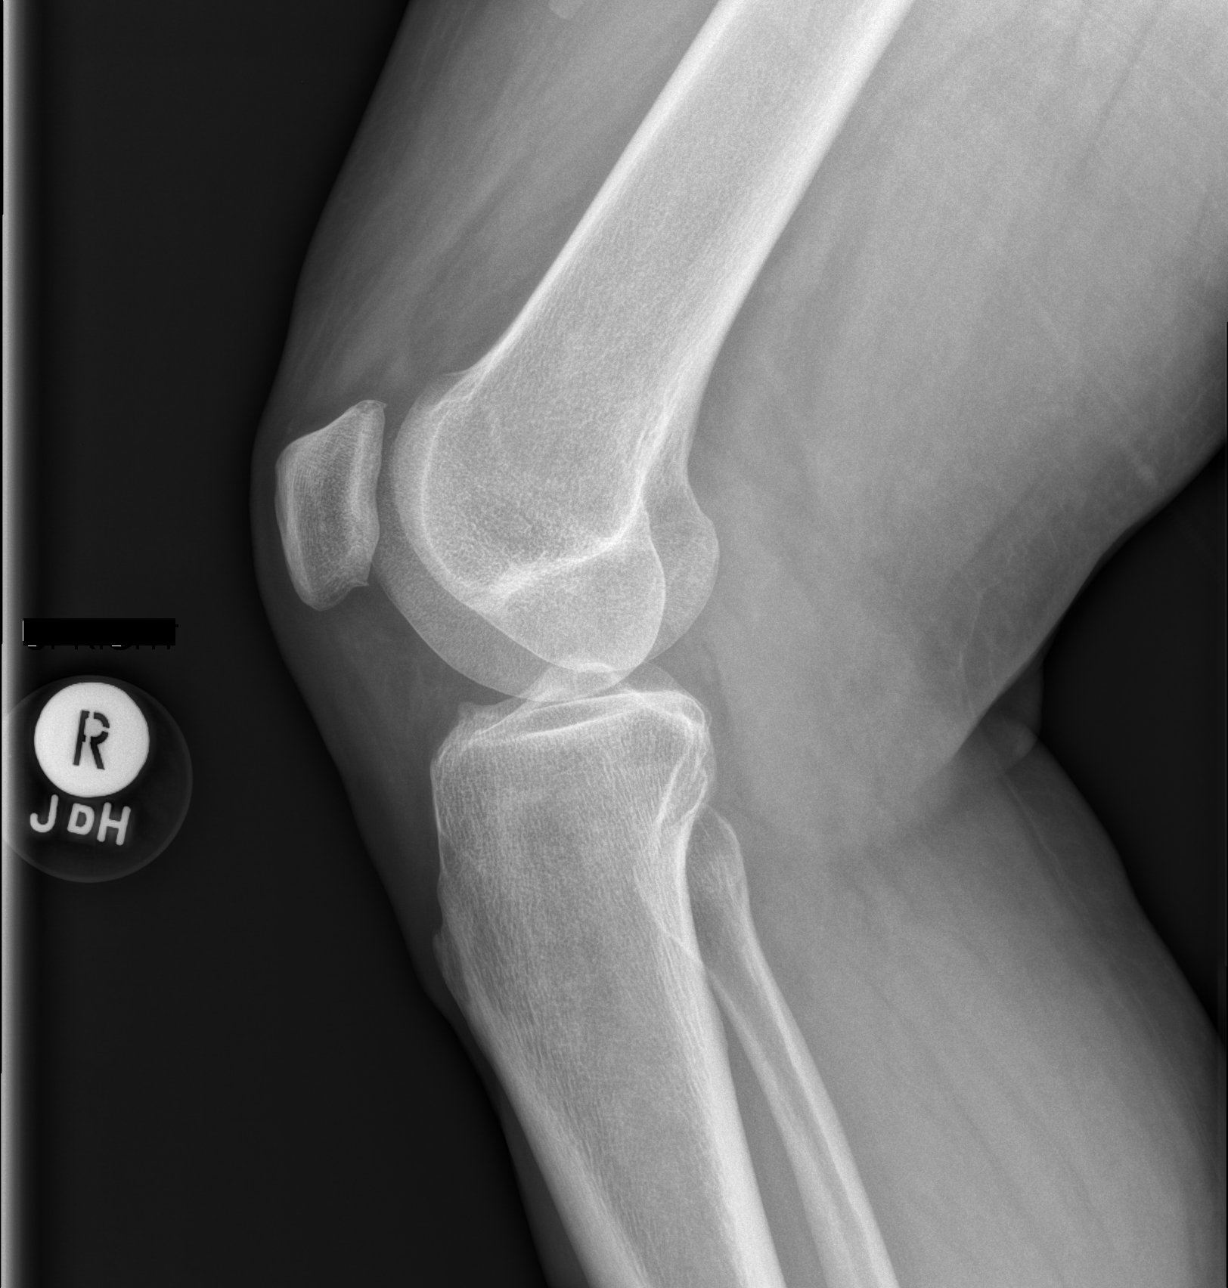

[x knee tunnel right]
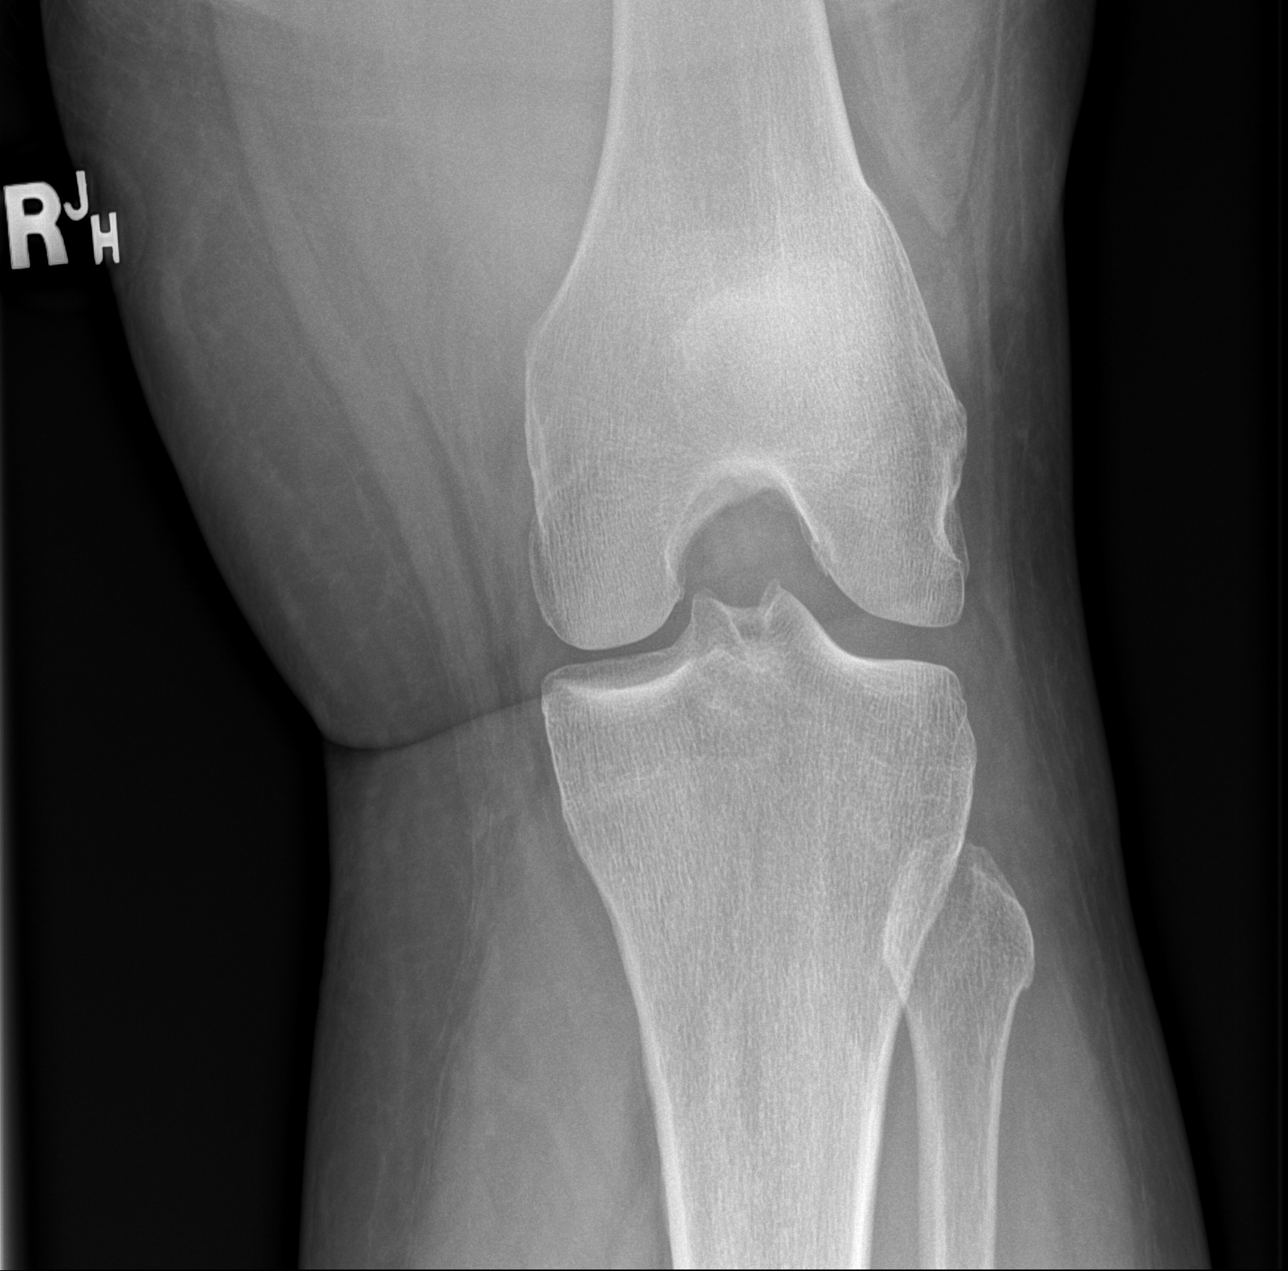

[x knee sunrise right]
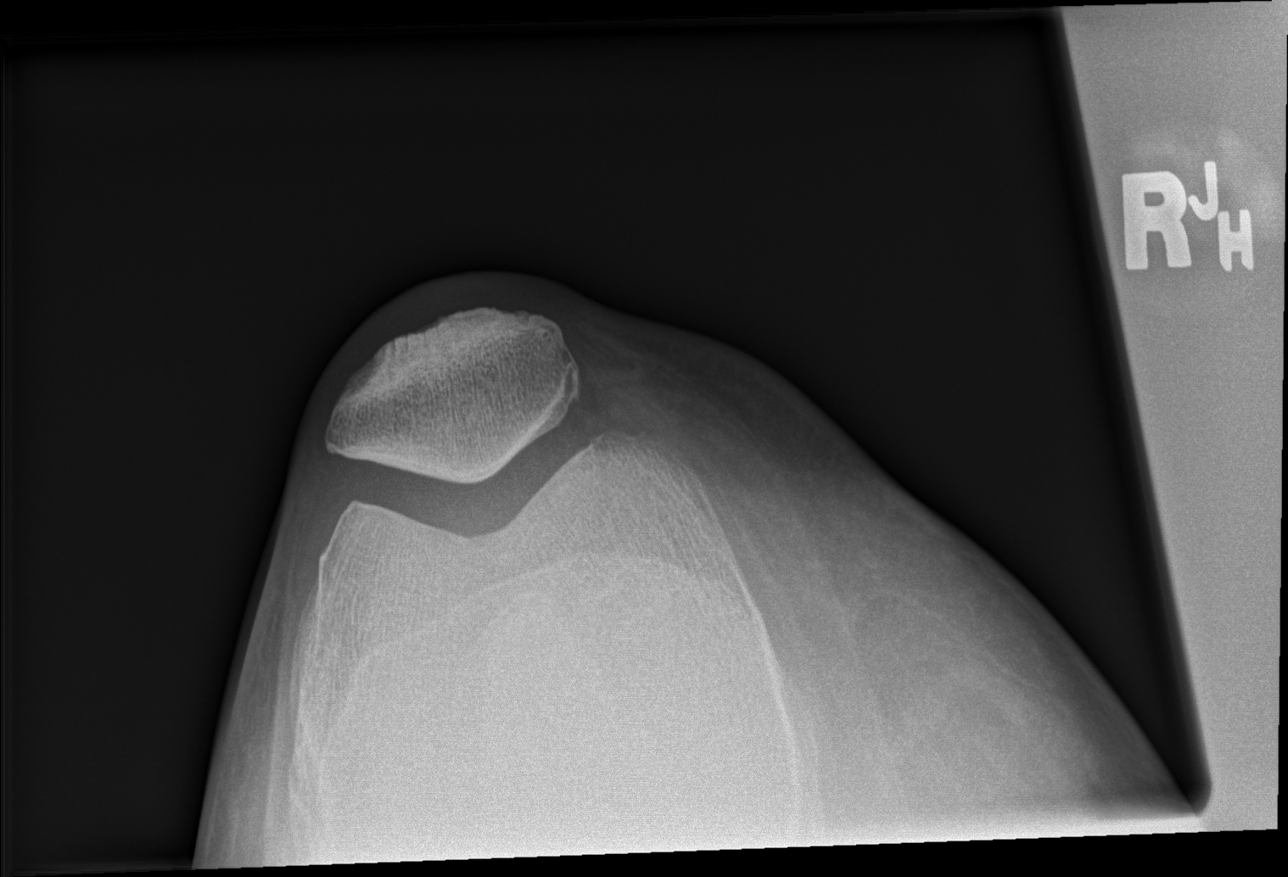

[4 of 4 positions shown; findings below may reference images not displayed]

FINDINGS: No acute bony or joint abnormality identified. No evidence of
fracture dislocation.
IMPRESSION: No acute abnormality .

## 2017-03-22 ENCOUNTER — Other Ambulatory Visit: Payer: Self-pay | Admitting: Endocrinology

## 2017-03-25 ENCOUNTER — Encounter: Payer: Self-pay | Admitting: Family Medicine

## 2017-03-25 ENCOUNTER — Ambulatory Visit (INDEPENDENT_AMBULATORY_CARE_PROVIDER_SITE_OTHER): Payer: 59 | Admitting: Family Medicine

## 2017-03-25 ENCOUNTER — Other Ambulatory Visit: Payer: Self-pay

## 2017-03-25 VITALS — BP 128/60 | HR 75 | Temp 98.3°F | Resp 17 | Ht 64.5 in | Wt 207.0 lb

## 2017-03-25 DIAGNOSIS — E6609 Other obesity due to excess calories: Secondary | ICD-10-CM | POA: Diagnosis not present

## 2017-03-25 DIAGNOSIS — I10 Essential (primary) hypertension: Secondary | ICD-10-CM

## 2017-03-25 DIAGNOSIS — Z794 Long term (current) use of insulin: Secondary | ICD-10-CM | POA: Diagnosis not present

## 2017-03-25 DIAGNOSIS — Z6834 Body mass index (BMI) 34.0-34.9, adult: Secondary | ICD-10-CM

## 2017-03-25 DIAGNOSIS — Z0000 Encounter for general adult medical examination without abnormal findings: Secondary | ICD-10-CM

## 2017-03-25 DIAGNOSIS — E1139 Type 2 diabetes mellitus with other diabetic ophthalmic complication: Secondary | ICD-10-CM | POA: Diagnosis not present

## 2017-03-25 LAB — POCT GLYCOSYLATED HEMOGLOBIN (HGB A1C): Hemoglobin A1C: 7.5

## 2017-03-25 NOTE — Patient Instructions (Addendum)
  ECG is unchanged from 2016 Continue current meds Your last a1c in august was 7.8%   IF you received an x-ray today, you will receive an invoice from [facility]. Please contact [facility] at [phone] with questions or concerns regarding your invoice.   IF you received labwork today, you will receive an invoice from [facility]. Please contact [facility] at [phone] with questions or concerns regarding your invoice.   Our billing staff will not be able to assist you with questions regarding bills from these companies.  You will be contacted with the lab results as soon as they are available. The fastest way to get your results is to activate your My Chart account. Instructions are located on the last page of this paperwork. If you have not heard from [place] regarding the results in 2 weeks, please contact this office.    Leg Cramps Leg cramps occur when a muscle or muscles tighten and you have no control over this tightening (involuntary muscle contraction). Muscle cramps can develop in any muscle, but the most common place is in the calf muscles of the leg. Those cramps can occur during exercise or when you are at rest. Leg cramps are painful, and they may last for a few seconds to a few minutes. Cramps may return several times before they finally stop. Usually, leg cramps are not caused by a serious medical problem. In many cases, the cause is not known. Some common causes include:  Overexertion.  Overuse from repetitive motions, or doing the same thing over and over.  Remaining in a certain position for a long period of time.  Improper preparation, form, or technique while performing a sport or an activity.  Dehydration.  Injury.  Side effects of some medicines.  Abnormally low levels of the salts and ions in your blood (electrolytes), especially potassium and calcium. These levels could be low if you are taking water pills (diuretics) or if you are  pregnant.  Follow these instructions at home: Watch your condition for any changes. Taking the following actions may help to lessen any discomfort that you are feeling:  Stay well-hydrated. Drink enough fluid to keep your urine clear or pale yellow.  Try massaging, stretching, and relaxing the affected muscle. Do this for several minutes at a time.  For tight or tense muscles, use a warm towel, heating pad, or hot shower water directed to the affected area.  If you are sore or have pain after a cramp, applying ice to the affected area may relieve discomfort. ? Put ice in a plastic bag. ? Place a towel between your skin and the bag. ? Leave the ice on for 20 minutes, 2-3 times per day.  Avoid strenuous exercise for several days if you have been having frequent leg cramps.  Make sure that your diet includes the essential minerals for your muscles to work normally.  Take medicines only as directed by your health care provider.  Contact a health care provider if:  Your leg cramps get more severe or more frequent, or they do not improve over time.  Your foot becomes cold, numb, or blue. This information is not intended to replace advice given to you by your health care provider. Make sure you discuss any questions you have with your health care provider. Document Released: 05/31/2004 Document Revised: 09/29/2015 Document Reviewed: 03/31/2014 Elsevier Interactive Patient Education  [employer].

## 2017-03-25 NOTE — Progress Notes (Signed)
Chief Complaint  Patient presents with  . Annual Exam    with out pap    Subjective:  Heidi Howell is a 61 y.o. female here for a health maintenance visit.  Patient is established pt  Patient Active Problem List   Diagnosis Date Noted  . Diabetes (HCC) 07/14/2015  . Essential hypertension 09/23/2014  . Adverse drug effect 02/12/2014  . Cortical cataract 08/17/2013  . Primary open angle glaucoma 08/17/2013  . No diabetic retinopathy in both eyes 08/17/2013  . Hyperopia 08/17/2013  . Astigmatism 08/17/2013  . Presbyopia 08/17/2013  . Obesity 08/20/2011  . Symptomatic menopausal or female climacteric states 08/20/2011  . Insomnia 08/20/2011  . Fibroids 08/20/2011    Past Medical History:  Diagnosis Date  . Anemia   . Bartholin gland cyst 06/04/2000  . Cyst   . Diabetes mellitus   . FH: colon cancer   . Fibroids   . Glaucoma   . H/O dysmenorrhea 06/04/2000  . H/O fatigue   . H/O menorrhagia 2001  . H/O: obesity   . Hypertension   . Infertility, female   . Menopausal symptoms   . Varicose vein   . Yeast infection     Past Surgical History:  Procedure Laterality Date  . BARTHOLIN GLAND CYST EXCISION  2010  . CESAREAN SECTION  1990,1996  . DILATION AND CURETTAGE OF UTERUS  1989  . TUBAL LIGATION  1996   bilateral     Outpatient Medications Prior to Visit  Medication Sig Dispense Refill  . amLODipine (NORVASC) 5 MG tablet     . aspirin 81 MG tablet Take 81 mg by mouth daily.    . BYSTOLIC 20 MG TABS     . carvedilol (COREG) 25 MG tablet     . Continuous Blood Gluc Receiver (FREESTYLE LIBRE READER) DEVI USE AS DIRECTED ONCE 6 Device 3  . Continuous Blood Gluc Sensor (FREESTYLE LIBRE SENSOR SYSTEM) MISC 1 Device by Does not apply route once a week. 4 each 11  . cyclobenzaprine (FLEXERIL) 5 MG tablet TAKE 1 TABLET TWICE A DAY AS NEEDED FOR MUSCLE SPASM 180 tablet 1  . Empagliflozin (JARDIANCE PO) Take by mouth.    . estrogens, conjugated, (PREMARIN) 0.3 MG  tablet Take 0.3 mg by mouth daily. Take daily for 21 days then do not take for 7 days.    . Fluticasone Furoate-Vilanterol (BREO ELLIPTA IN) Inhale into the lungs.    . Fluticasone-Salmeterol (ADVAIR) 100-50 MCG/DOSE AEPB Inhale 1 puff into the lungs 2 (two) times daily. 3 each 0  . hydrochlorothiazide (HYDRODIURIL) 25 MG tablet     . Insulin Disposable Pump (V-GO 30) KIT USE 1 DEVICE DAILY 30 kit 11  . insulin regular (HUMULIN R) 250 units/2.5mL (100 units/mL) injection For use in pump, total of 90 units per day 30 mL 11  . losartan-hydrochlorothiazide (HYZAAR) 100-12.5 MG per tablet Take 1 tablet by mouth daily. 90 tablet 3  . medroxyPROGESTERone (PROVERA) 5 MG tablet Take 5 mg by mouth daily.    . metFORMIN (GLUCOPHAGE) 1000 MG tablet Take 1 tablet (1,000 mg total) by mouth at bedtime. 90 tablet 3  . metoprolol succinate (TOPROL-XL) 100 MG 24 hr tablet TAKE 1 TABLET DAILY WITH/OR IMMEDIATELY FOLLOWING A MEAL 90 tablet 1  . montelukast (SINGULAIR) 10 MG tablet TAKE 1 TABLET AT BEDTIME.  "OV NEEDED FOR ADDITIONAL REFILLS" 30 tablet 0  . Multiple Vitamin (MULTIVITAMIN) tablet Take 1 tablet by mouth daily.    . ONE   TOUCH ULTRA TEST test strip USE TO CHECK SUGAR THREE TIMES A DAY AS INSTRUCTED 300 each 3  . OSPHENA 60 MG TABS     . telmisartan (MICARDIS) 80 MG tablet     . Travoprost, BAK Free, (TRAVATAN) 0.004 % SOLN ophthalmic solution Place 1 drop into both eyes at bedtime.     . zolpidem (AMBIEN) 10 MG tablet Take 1 tablet (10 mg total) by mouth at bedtime as needed for sleep. 90 tablet 1   No facility-administered medications prior to visit.     Allergies  Allergen Reactions  . Ace Inhibitors Cough     Family History  Problem Relation Age of Onset  . Cancer Father        colon  . Cancer Mother        per patient stomach  . Diabetes Maternal Grandmother   . Mental illness Sister   . Heart disease Brother      Health Habits: Dental Exam: up to date Eye Exam: up to  date Exercise: 0 times/week on average Current exercise activities: walking/running Diet:   Social History   Socioeconomic History  . Marital status: Married    Spouse name: William Melton  . Number of children: Not on file  . Years of education: Not on file  . Highest education level: Not on file  Social Needs  . Financial resource strain: Not on file  . Food insecurity - worry: Not on file  . Food insecurity - inability: Not on file  . Transportation needs - medical: Not on file  . Transportation needs - non-medical: Not on file  Occupational History  . Not on file  Tobacco Use  . Smoking status: Never Smoker  . Smokeless tobacco: Never Used  Substance and Sexual Activity  . Alcohol use: No    Alcohol/week: 0.0 oz  . Drug use: No  . Sexual activity: Yes    Partners: Male    Comment: number of sex partners in the last 12 months 1  Other Topics Concern  . Not on file  Social History Narrative   Exercise walk 2-3 times for 30-40 minutes   Social History   Substance and Sexual Activity  Alcohol Use No  . Alcohol/week: 0.0 oz   Social History   Tobacco Use  Smoking Status Never Smoker  Smokeless Tobacco Never Used   Social History   Substance and Sexual Activity  Drug Use No    GYN: Sexual Health Menstrual status: regular menses LMP: Patient's last menstrual period was 01/19/2011. Last pap smear: see HM section History of abnormal pap smears:    Sexually active: with female partner   Health Maintenance: See under health Maintenance activity for review of completion dates as well. Immunization History  Administered Date(s) Administered  . Influenza, Seasonal, Injecte, Preservative Fre 05/29/2012  . Influenza,inj,Quad PF,6+ Mos 02/26/2013, 01/14/2014, 04/14/2015, 02/21/2017  . Pneumococcal Conjugate-13 02/22/2014  . Pneumococcal Polysaccharide-23 02/21/2017  . Pneumococcal-Unspecified 12/30/2008, 05/07/2010  . Tdap 11/20/2016      Depression  Screen-PHQ2/9 Depression screen PHQ 2/9 03/25/2017 02/21/2017 11/20/2016 01/11/2015 10/11/2014  Decreased Interest 0 0 0 0 0  Down, Depressed, Hopeless 0 0 0 0 0  PHQ - 2 Score 0 0 0 0 0        Depression Severity and Treatment Recommendations:  0-4= None  5-9= Mild / Treatment: Support, educate to call if worse; return in one month  10-14= Moderate / Treatment: Support, watchful waiting; Antidepressant or Psycotherapy  15-19=   Moderately severe / Treatment: Antidepressant OR Psychotherapy  >= 20 = Major depression, severe / Antidepressant AND Psychotherapy    Review of Systems   Review of Systems  Constitutional: Negative for chills, diaphoresis and fever.  HENT: Positive for congestion, sinus pain and sore throat. Negative for ear discharge, ear pain, hearing loss and tinnitus.   Eyes: Positive for pain, discharge and redness.       Has glaucoma and sees Ophthalmology every 3-4 months  Respiratory: Positive for wheezing. Negative for cough, hemoptysis, sputum production, shortness of breath and stridor.   Cardiovascular: Negative for chest pain, palpitations and orthopnea.  Gastrointestinal: Negative for abdominal pain, diarrhea, nausea and vomiting.       Blood on toilet paper from hemorrhoids which itches and causes pain  Genitourinary: Negative for dysuria, frequency and urgency.  Musculoskeletal: Positive for back pain, myalgias and neck pain.  Skin: Negative for itching and rash.  Neurological: Negative for dizziness, tingling, sensory change, speech change, weakness and headaches.  Endo/Heme/Allergies: Positive for polydipsia. Bruises/bleeds easily.  Psychiatric/Behavioral: Negative for depression. The patient is not nervous/anxious and does not have insomnia.        Objective:   Vitals:   03/25/17 0901  BP: 128/60  Pulse: 75  Resp: 17  Temp: 98.3 F (36.8 C)  TempSrc: Oral  SpO2: 97%  Weight: 207 lb (93.9 kg)  Height: 5' 4.5" (1.638 m)    Body mass index is  34.98 kg/m. Wt Readings from Last 3 Encounters:  03/25/17 207 lb (93.9 kg)  02/21/17 205 lb 12.8 oz (93.4 kg)  12/31/16 206 lb (93.4 kg)    Physical Exam  BP 128/60 (BP Location: Left Arm, Patient Position: Sitting, Cuff Size: Normal)   Pulse 75   Temp 98.3 F (36.8 C) (Oral)   Resp 17   Ht 5' 4.5" (1.638 m)   Wt 207 lb (93.9 kg)   LMP 01/19/2011   SpO2 97%   BMI 34.98 kg/m   General Appearance:    Alert, cooperative, no distress, appears stated age  Head:    Normocephalic, without obvious abnormality, atraumatic  Eyes:    PERRL, conjunctiva/corneas clear, EOM's intact, fundi    benign, both eyes  Ears:    Normal TM's and external ear canals, both ears  Nose:   Nares normal, septum midline, mucosa normal, no drainage    or sinus tenderness  Throat:   Lips, mucosa, and tongue normal; teeth and gums normal  Neck:   Supple, symmetrical, trachea midline, no adenopathy;    thyroid:  no enlargement/tenderness/nodules; no carotid   bruit or JVD  Back:     Symmetric, no curvature, ROM normal, no CVA tenderness  Lungs:     Clear to auscultation bilaterally, respirations unlabored  Chest Wall:    No tenderness or deformity   Heart:    Regular rate and rhythm, S1 and S2 normal, no murmur, rub   or gallop  Breast Exam:    No tenderness, masses, or nipple abnormality  Abdomen:     Soft, non-tender, bowel sounds active all four quadrants,    no masses, no organomegaly  Genitalia:     Pap smear done July 2018  Extremities:   Extremities normal, atraumatic, no cyanosis or edema  Pulses:   2+ and symmetric all extremities  Skin:   Skin color, texture, turgor normal, no rashes or lesions  Lymph nodes:   Cervical, supraclavicular, and axillary nodes normal  Neurologic:   CNII-XII intact, normal strength,   sensation and reflexes    throughout      Assessment/Plan:   Patient was seen for a health maintenance exam.  Counseled the patient on health maintenance issues. Reviewed her  health mainteance schedule and ordered appropriate tests (see orders.) Counseled on regular exercise and weight management. Recommend regular eye exams and dental cleaning.   The following issues were addressed today for health maintenance:   Kirk was seen today for annual exam.  Diagnoses and all orders for this visit:  Encounter for health maintenance examination in adult- discussed age appropriate screenings Pap smear is up to date -     EKG 12-Lead  Type 2 diabetes mellitus with other ophthalmic complication, with long-term current use of insulin ([facility])- follow up with Endocrinology -     POCT glycosylated hemoglobin (Hb A1C) -     EKG 12-Lead  Essential hypertension- bp at goal, bmp up to date -     EKG 12-Lead  Class 1 obesity due to excess calories with serious comorbidity and body mass index (BMI) of 34.0 to 34.9 in adult   Lab Results  Component Value Date   HGBA1C 7.5 03/25/2017   ECG unchanged compared to 2016 No st elevation Sinus rhythm  No Follow-up on file.    Body mass index is 34.98 kg/m.:  Discussed the patient's BMI with patient. The BMI body mass index is 34.98 kg/m.     Future Appointments  Date Time Provider [place]  05/09/2017  8:00 AM Renato Shin, MD [facility] None  09/23/2017  8:40 AM Forrest Moron, MD [facility]    Patient Instructions    ECG is unchanged from 2016 Continue current meds Your last a1c in august was 7.8%   IF you received an x-ray today, you will receive an invoice from [facility]. Please contact [facility] at [phone] with questions or concerns regarding your invoice.   IF you received labwork today, you will receive an invoice from [facility]. Please contact [facility] at [phone] with questions or concerns regarding your invoice.   Our billing staff will not be able to assist you with questions regarding bills from these companies.  You will be contacted with the lab  results as soon as they are available. The fastest way to get your results is to activate your My Chart account. Instructions are located on the last page of this paperwork. If you have not heard from [place] regarding the results in 2 weeks, please contact this office.    Leg Cramps Leg cramps occur when a muscle or muscles tighten and you have no control over this tightening (involuntary muscle contraction). Muscle cramps can develop in any muscle, but the most common place is in the calf muscles of the leg. Those cramps can occur during exercise or when you are at rest. Leg cramps are painful, and they may last for a few seconds to a few minutes. Cramps may return several times before they finally stop. Usually, leg cramps are not caused by a serious medical problem. In many cases, the cause is not known. Some common causes include:  Overexertion.  Overuse from repetitive motions, or doing the same thing over and over.  Remaining in a certain position for a long period of time.  Improper preparation, form, or technique while performing a sport or an activity.  Dehydration.  Injury.  Side effects of some medicines.  Abnormally low levels of the salts and ions in your blood (electrolytes), especially potassium and calcium. These  levels could be low if you are taking water pills (diuretics) or if you are pregnant.  Follow these instructions at home: Watch your condition for any changes. Taking the following actions may help to lessen any discomfort that you are feeling:  Stay well-hydrated. Drink enough fluid to keep your urine clear or pale yellow.  Try massaging, stretching, and relaxing the affected muscle. Do this for several minutes at a time.  For tight or tense muscles, use a warm towel, heating pad, or hot shower water directed to the affected area.  If you are sore or have pain after a cramp, applying ice to the affected area may relieve discomfort. ? Put ice in a plastic  bag. ? Place a towel between your skin and the bag. ? Leave the ice on for 20 minutes, 2-3 times per day.  Avoid strenuous exercise for several days if you have been having frequent leg cramps.  Make sure that your diet includes the essential minerals for your muscles to work normally.  Take medicines only as directed by your health care provider.  Contact a health care provider if:  Your leg cramps get more severe or more frequent, or they do not improve over time.  Your foot becomes cold, numb, or blue. This information is not intended to replace advice given to you by your health care provider. Make sure you discuss any questions you have with your health care provider. Document Released: 05/31/2004 Document Revised: 09/29/2015 Document Reviewed: 03/31/2014 Elsevier Interactive Patient Education  [employer].

## 2017-04-18 ENCOUNTER — Telehealth: Payer: Self-pay | Admitting: Family Medicine

## 2017-04-18 DIAGNOSIS — G4700 Insomnia, unspecified: Secondary | ICD-10-CM

## 2017-04-18 DIAGNOSIS — E1165 Type 2 diabetes mellitus with hyperglycemia: Secondary | ICD-10-CM

## 2017-04-18 DIAGNOSIS — IMO0002 Reserved for concepts with insufficient information to code with codable children: Secondary | ICD-10-CM

## 2017-04-18 NOTE — Telephone Encounter (Signed)
Copied from [place]. Topic: Quick Communication - See Telephone Encounter >> Apr 18, 2017  2:08 PM Burnis Medin, NT wrote: CRM for notification. See Telephone encounter for: Pt. Is calling to get a refill on metFORMIN (GLUCOPHAGE) 1000 MG tablet. Pt uses [facility], [place]  04/18/17.

## 2017-04-19 NOTE — Telephone Encounter (Signed)
Pt states she is trying to get a new doctor and wanted to know if Dr. Nolon Rod would take over with the management of her diabetic medications. Pt states he has about a week left of the medications.

## 2017-04-19 NOTE — Telephone Encounter (Signed)
Left message for the pt to call the office back. Chart shows Metformin was previously prescribed by another provider.

## 2017-04-20 NOTE — Telephone Encounter (Signed)
Please advise 

## 2017-04-24 NOTE — Telephone Encounter (Signed)
Pt needs a refill on the metformin. [facility] Please advise.

## 2017-04-24 NOTE — Telephone Encounter (Signed)
I can take over the meds. I will

## 2017-04-25 NOTE — Telephone Encounter (Signed)
Pt returned call,  She states pharmacy has not received the rx for metformin

## 2017-04-25 NOTE — Telephone Encounter (Signed)
Patient returned call stating Metformin has not been sent to [facility]. Please advise, thank you.

## 2017-04-25 NOTE — Telephone Encounter (Signed)
Called patient LMVM to check if Metformin was refilled/sent by the mail order pharmacy.

## 2017-04-26 NOTE — Telephone Encounter (Signed)
Patient requesting RF on Ambien, per message she has contacted the pharmacy. Thank you

## 2017-04-26 NOTE — Telephone Encounter (Signed)
Please DISREGARD message. Thank you

## 2017-05-01 NOTE — Telephone Encounter (Signed)
Pt states the pharmacy has still not received the rx for her zolpidem (AMBIEN) 10 MG tablet metFORMIN (GLUCOPHAGE) 1000 MG tablet  Pt requesting send to  [place], [place] [phone] (Phone) [phone] (Fax)   Pt just seen 03/2017

## 2017-05-02 MED ORDER — METFORMIN HCL 1000 MG PO TABS: 1000.0000 mg | ORAL_TABLET | Freq: Every day | ORAL | 1 refills | Status: DC

## 2017-05-02 MED ORDER — ZOLPIDEM TARTRATE 10 MG PO TABS: 10.0000 mg | ORAL_TABLET | Freq: Every evening | ORAL | 1 refills | Status: DC | PRN

## 2017-05-02 NOTE — Telephone Encounter (Signed)
Notified patient that refill was sent to the pharmacy

## 2017-05-02 NOTE — Addendum Note (Signed)
Addended by: Delia Chimes A on: 05/02/2017 01:52 PM   Modules accepted: Orders

## 2017-05-09 ENCOUNTER — Other Ambulatory Visit: Payer: Self-pay | Admitting: General Practice

## 2017-05-09 ENCOUNTER — Encounter: Payer: Self-pay | Admitting: Endocrinology

## 2017-05-09 ENCOUNTER — Ambulatory Visit: Payer: 59 | Admitting: Endocrinology

## 2017-05-09 VITALS — BP 128/66 | HR 68 | Wt 209.0 lb

## 2017-05-09 DIAGNOSIS — Z794 Long term (current) use of insulin: Secondary | ICD-10-CM

## 2017-05-09 DIAGNOSIS — E1165 Type 2 diabetes mellitus with hyperglycemia: Secondary | ICD-10-CM | POA: Diagnosis not present

## 2017-05-09 MED ORDER — V-GO 30 KIT: PACK | 11 refills | Status: DC

## 2017-05-09 MED ORDER — DULAGLUTIDE 0.75 MG/0.5ML ~~LOC~~ SOAJ: 0.7500 mg | SUBCUTANEOUS | 3 refills | Status: DC

## 2017-05-09 NOTE — Telephone Encounter (Signed)
Med have not been refilled since 2017 sending back to provider Has upcoming OV 09/23/17

## 2017-05-09 NOTE — Progress Notes (Signed)
Subjective:    Patient ID: Heidi Howell, female    DOB: [birth year], 62 y.o.   MRN: [id number]  HPI Pt returns for f/u of diabetes mellitus: DM type: Insulin-requiring type 2. Dx'ed: 2007.  Complications: none.  Therapy: insulin since 2014, metformin, and jardiance.   GDM: never DKA: never Severe hypoglycemia: never. Pancreatitis: never.  Other: she is pursuing weight-loss surgery; she started  V-GO-30 pump in early 2017; she has "freestyle libre" device.  Interval history:   She takes these clicks: breakfast:4, lunch:4 and supper:8 (1 click is 2 units).  no cbg record, but states cbg's vary from 99-200.  It is still highest at lunch.   Past Medical History:  Diagnosis Date  . Anemia   . Bartholin gland cyst 06/04/2000  . Cyst   . Diabetes mellitus   . FH: colon cancer   . Fibroids   . Glaucoma   . H/O dysmenorrhea 06/04/2000  . H/O fatigue   . H/O menorrhagia 2001  . H/O: obesity   . Hypertension   . Infertility, female   . Menopausal symptoms   . Varicose vein   . Yeast infection     Past Surgical History:  Procedure Laterality Date  . [place] GLAND CYST EXCISION  2010  . CESAREAN SECTION  [id number]  . DILATION AND CURETTAGE OF UTERUS  1989  . TUBAL LIGATION  1996   bilateral    Social History   Socioeconomic History  . Marital status: Married    Spouse name: Madelaine Etienne  . Number of children: Not on file  . Years of education: Not on file  . Highest education level: Not on file  Social Needs  . Financial resource strain: Not on file  . Food insecurity - worry: Not on file  . Food insecurity - inability: Not on file  . Transportation needs - medical: Not on file  . Transportation needs - non-medical: Not on file  Occupational History  . Not on file  Tobacco Use  . Smoking status: Never Smoker  . Smokeless tobacco: Never Used  Substance and Sexual Activity  . Alcohol use: No    Alcohol/week: 0.0 oz  . Drug use: No  . Sexual activity: Yes   Partners: Male    Comment: number of sex partners in the last 6 months 1  Other Topics Concern  . Not on file  Social History Narrative   Exercise walk 2-3 times for 30-40 minutes    Current Outpatient Medications on File Prior to Visit  Medication Sig Dispense Refill  . amLODipine (NORVASC) 5 MG tablet     . aspirin 81 MG tablet Take 81 mg by mouth daily.    Marland Kitchen BYSTOLIC 20 MG TABS     . carvedilol (COREG) 25 MG tablet     . Continuous Blood Gluc Receiver (FREESTYLE LIBRE READER) DEVI USE AS DIRECTED ONCE 6 Device 3  . Continuous Blood Gluc Sensor (FREESTYLE LIBRE SENSOR SYSTEM) MISC 1 Device by Does not apply route once a week. 4 each 11  . cyclobenzaprine (FLEXERIL) 5 MG tablet TAKE 1 TABLET TWICE A DAY AS NEEDED FOR MUSCLE SPASM 180 tablet 1  . Empagliflozin (JARDIANCE PO) Take by mouth.    . estrogens, conjugated, (PREMARIN) 0.3 MG tablet Take 0.3 mg by mouth daily. Take daily for 21 days then do not take for 7 days.    . Fluticasone Furoate-Vilanterol (BREO ELLIPTA IN) Inhale into the lungs.    . Fluticasone-Salmeterol (ADVAIR) 100-50  MCG/DOSE AEPB Inhale 1 puff into the lungs 2 (two) times daily. 3 each 0  . hydrochlorothiazide (HYDRODIURIL) 25 MG tablet     . insulin regular (HUMULIN R) 250 units/2.63mL (100 units/mL) injection For use in pump, total of 90 units per day 30 mL 11  . losartan-hydrochlorothiazide (HYZAAR) 100-12.5 MG per tablet Take 1 tablet by mouth daily. 90 tablet 3  . medroxyPROGESTERone (PROVERA) 5 MG tablet Take 5 mg by mouth daily.    . metFORMIN (GLUCOPHAGE) 1000 MG tablet Take 1 tablet (1,000 mg total) by mouth at bedtime. 90 tablet 1  . metoprolol succinate (TOPROL-XL) 100 MG 24 hr tablet TAKE 1 TABLET DAILY WITH/OR IMMEDIATELY FOLLOWING A MEAL 90 tablet 1  . montelukast (SINGULAIR) 10 MG tablet TAKE 1 TABLET AT BEDTIME.  "OV NEEDED FOR ADDITIONAL REFILLS" 30 tablet 0  . Multiple Vitamin (MULTIVITAMIN) tablet Take 1 tablet by mouth daily.    . ONE TOUCH  ULTRA TEST test strip USE TO CHECK SUGAR THREE TIMES A DAY AS INSTRUCTED 300 each 3  . OSPHENA 60 MG TABS     . telmisartan (MICARDIS) 80 MG tablet     . Travoprost, BAK Free, (TRAVATAN) 0.004 % SOLN ophthalmic solution Place 1 drop into both eyes at bedtime.     Marland Kitchen zolpidem (AMBIEN) 10 MG tablet Take 1 tablet (10 mg total) by mouth at bedtime as needed for sleep. 90 tablet 1   No current facility-administered medications on file prior to visit.     Allergies  Allergen Reactions  . Ace Inhibitors Cough    Family History  Problem Relation Age of Onset  . Cancer Father        colon  . Cancer Mother        per patient stomach  . Diabetes Maternal Grandmother   . Mental illness Sister   . Heart disease Brother     BP 128/66 (BP Location: Left Arm, Patient Position: Sitting, Cuff Size: Normal)   Pulse 68   Wt 209 lb (94.8 kg)   LMP 01/19/2011   SpO2 97%   BMI 35.32 kg/m    Review of Systems She denies hypoglycemia.     Objective:   Physical Exam VITAL SIGNS:  See vs page GENERAL: no distress Pulses: foot pulses are intact bilaterally.   MSK: no deformity of the feet or ankles.  CV: trace bilat edema of the legs or ankles Skin:  no ulcer on the feet or ankles.  normal color and temp on the feet and ankles Neuro: sensation is intact to touch on the feet and ankles.    Lab Results  Component Value Date   HGBA1C 7.5 03/25/2017   Staff has downloaded continuous glucose monitor, and we reviewed together.  Glucoses vary from 120-260.  It is highest after breakfast.  There is a data gap in the afternoon    Assessment & Plan:  Obesity: worse Insulin-requiring type 2 DM: Needs increased rx, if it can be done with a regimen that avoids or minimizes hypoglycemia.   Patient Instructions  check your blood sugar twice a day.  vary the time of day when you check, between before the 3 meals, and at bedtime.  also check if you have symptoms of your blood sugar being too high or  too low.  please keep a record of the readings and bring it to your next appointment here.  You can write it on any piece of paper.  please call us sooner if  your blood sugar goes below 70, or if you have a lot of readings over 200.   Please come back for a follow-up appointment in 3 months.   I have sent a prescription to your pharmacy, to add "trulicity."  If they prefer an alternative, we can easily change it. Please also reduce clicks to breakfast:2 lunch:1 and supper:4 clicks.   I have sent a prescription to your pharmacy, for the freestyle "libre."  Call if you need to see Vaughan Basta, to learn more.

## 2017-05-09 NOTE — Telephone Encounter (Signed)
Copied from [place] [id number]. Topic: Quick Communication - See Telephone Encounter >> May 09, 2017  4:49 PM Percell Belt A wrote: CRM for notification. See Telephone encounter for: pt called in and needs refills on her  Telmisartan  Montelukast  [place]  Hyrochlorothazide [place] [facility]  05/09/17.

## 2017-05-09 NOTE — Patient Instructions (Addendum)
check your blood sugar twice a day.  vary the time of day when you check, between before the 3 meals, and at bedtime.  also check if you have symptoms of your blood sugar being too high or too low.  please keep a record of the readings and bring it to your next appointment here.  You can write it on any piece of paper.  please call us sooner if your blood sugar goes below 70, or if you have a lot of readings over 200.   Please come back for a follow-up appointment in 3 months.   I have sent a prescription to your pharmacy, to add "trulicity."  If they prefer an alternative, we can easily change it. Please also reduce clicks to breakfast:2 lunch:1 and supper:4 clicks.   I have sent a prescription to your pharmacy, for the freestyle "libre."  Call if you need to see Vaughan Basta, to learn more.

## 2017-05-10 NOTE — Telephone Encounter (Signed)
Routing to [employer]

## 2017-05-10 NOTE — Telephone Encounter (Signed)
Pt not a patient of [facility], Pt is a patient of [facility], Please route accordingly.

## 2017-05-13 ENCOUNTER — Telehealth: Payer: Self-pay | Admitting: Endocrinology

## 2017-05-13 NOTE — Telephone Encounter (Signed)
Patient was here 03/25/2018 for Health Maintenance exam, now she is requesting refills for Telmisartan, montelukast, Bystolic, Amlodipine, Carvedilol, Hctz and Breo. Please advise, thank you.

## 2017-05-13 NOTE — Telephone Encounter (Signed)
Pt stated when she was in the office Dr gave her a discount card for her Vgo pump. She wants to know if we have any more hers was expired.    PLEASE ADVISE

## 2017-05-14 ENCOUNTER — Other Ambulatory Visit: Payer: Self-pay

## 2017-05-14 MED ORDER — V-GO 30 KIT: PACK | 11 refills | Status: DC

## 2017-05-14 NOTE — Telephone Encounter (Signed)
I called to tell patient we did have a card that wasn't expired & I called Vgo to verify. I told her that I would put in envelope at the front desk for her to pick up.

## 2017-05-14 NOTE — Telephone Encounter (Signed)
Done

## 2017-05-14 NOTE — Telephone Encounter (Signed)
Pt need script sent over to pharmacy  Insulin Disposable Pump (V-GO 30) [place], [place] - [address]

## 2017-05-17 MED ORDER — FLUTICASONE FUROATE-VILANTEROL 200-25 MCG/INH IN AEPB: 1.0000 | INHALATION_SPRAY | Freq: Every day | RESPIRATORY_TRACT | 1 refills | Status: DC

## 2017-05-17 MED ORDER — TELMISARTAN 80 MG PO TABS: 80.0000 mg | ORAL_TABLET | Freq: Every day | ORAL | 1 refills | Status: DC

## 2017-05-17 MED ORDER — CARVEDILOL 25 MG PO TABS: 25.0000 mg | ORAL_TABLET | Freq: Every day | ORAL | 3 refills | Status: DC

## 2017-05-17 MED ORDER — HYDROCHLOROTHIAZIDE 25 MG PO TABS: 25.0000 mg | ORAL_TABLET | Freq: Every day | ORAL | 1 refills | Status: DC

## 2017-05-17 MED ORDER — AMLODIPINE BESYLATE 5 MG PO TABS: 5.0000 mg | ORAL_TABLET | Freq: Every day | ORAL | 3 refills | Status: DC

## 2017-05-17 MED ORDER — BYSTOLIC 20 MG PO TABS: 20.0000 mg | ORAL_TABLET | Freq: Every day | ORAL | 3 refills | Status: DC

## 2017-05-17 NOTE — Telephone Encounter (Signed)
Pt returned call. She said her strength for Carvedilol is 25 MG and Breo strength is 200 MG - 25 MCG.

## 2017-05-17 NOTE — Telephone Encounter (Signed)
I have the medications ready to send to the pharmacy. Please contact the patient and verify the dose and frequency of her telmisartan as well as the carvedilol.  Also I need to know the strength of the Breo that she is currently taking.

## 2017-05-17 NOTE — Telephone Encounter (Signed)
[facility]

## 2017-05-17 NOTE — Telephone Encounter (Signed)
Spoke to pt.  She takes telmisartan once daily She will return call to [place] on her carvedilol and the strength of inhaler Breo as she did not remember

## 2017-05-22 ENCOUNTER — Other Ambulatory Visit: Payer: Self-pay | Admitting: Family Medicine

## 2017-05-22 NOTE — Telephone Encounter (Signed)
See request for Montelukast 10 mg; last office visit was 03/25/17; last refill on this medication was 01/29/15

## 2017-05-22 NOTE — Telephone Encounter (Signed)
Copied from [place]. Topic: General - Other >> May 22, 2017 12:00 PM Darl Householder, RMA wrote: Reason for CRM: Medication refill request for Montelukast 10 [facility]

## 2017-05-23 MED ORDER — MONTELUKAST SODIUM 10 MG PO TABS: ORAL_TABLET | ORAL | 11 refills | Status: DC

## 2017-05-23 NOTE — Telephone Encounter (Signed)
Refill request for montelukast 10 mg #30 with 11 refills approved per dr. Nolon Rod. Dgaddy, CMA

## 2017-06-07 ENCOUNTER — Other Ambulatory Visit: Payer: Self-pay | Admitting: Endocrinology

## 2017-06-11 ENCOUNTER — Telehealth: Payer: Self-pay | Admitting: Family Medicine

## 2017-06-11 NOTE — Telephone Encounter (Signed)
Patient dropped off FMLA forms to be completed by Dr. Nolon Rod. She also brought in her old FMLA forms from 2017-2018 year. After looking over the forms they had been completed by a different provider at a different office. Dr Nolon Rod has never written this patient out of work for anything. I called the patient and left her a voicemail stating that if she needed these forms completed she would need to make an office visit with Dr Nolon Rod to discuss these issues and go over how much time she was needing off and why.  I will keep the forms on my desk until I hear back from the patient or she comes in for her [place].

## 2017-06-12 ENCOUNTER — Ambulatory Visit (INDEPENDENT_AMBULATORY_CARE_PROVIDER_SITE_OTHER): Payer: 59 | Admitting: Family Medicine

## 2017-06-12 ENCOUNTER — Ambulatory Visit (INDEPENDENT_AMBULATORY_CARE_PROVIDER_SITE_OTHER): Payer: 59

## 2017-06-12 ENCOUNTER — Other Ambulatory Visit: Payer: Self-pay

## 2017-06-12 ENCOUNTER — Encounter: Payer: Self-pay | Admitting: Family Medicine

## 2017-06-12 VITALS — BP 120/68 | HR 81 | Temp 98.0°F | Resp 16 | Ht 64.5 in | Wt 204.0 lb

## 2017-06-12 DIAGNOSIS — Z794 Long term (current) use of insulin: Secondary | ICD-10-CM

## 2017-06-12 DIAGNOSIS — M25511 Pain in right shoulder: Secondary | ICD-10-CM

## 2017-06-12 DIAGNOSIS — G8929 Other chronic pain: Secondary | ICD-10-CM | POA: Diagnosis not present

## 2017-06-12 DIAGNOSIS — I10 Essential (primary) hypertension: Secondary | ICD-10-CM | POA: Diagnosis not present

## 2017-06-12 DIAGNOSIS — M25611 Stiffness of right shoulder, not elsewhere classified: Secondary | ICD-10-CM

## 2017-06-12 DIAGNOSIS — M545 Low back pain: Secondary | ICD-10-CM | POA: Diagnosis not present

## 2017-06-12 DIAGNOSIS — E1165 Type 2 diabetes mellitus with hyperglycemia: Secondary | ICD-10-CM

## 2017-06-12 NOTE — Patient Instructions (Addendum)
     IF you received an x-ray today, you will receive an invoice from [facility]. Please contact [facility] at [phone] with questions or concerns regarding your invoice.   IF you received labwork today, you will receive an invoice from [facility]. Please contact [facility] at [phone] with questions or concerns regarding your invoice.   Our billing staff will not be able to assist you with questions regarding bills from these companies.  You will be contacted with the lab results as soon as they are available. The fastest way to get your results is to activate your My Chart account. Instructions are located on the last page of this paperwork. If you have not heard from us regarding the results in 2 weeks, please contact this office.     Shoulder Pain Many things can cause shoulder pain, including:  An injury to the area.  Overuse of the shoulder.  Arthritis.  The source of the pain can be:  Inflammation.  An injury to the shoulder joint.  An injury to a tendon, ligament, or bone.  Follow these instructions at home: Take these actions to help with your pain:  Squeeze a soft ball or a foam pad as much as possible. This helps to keep the shoulder from swelling. It also helps to strengthen the arm.  Take over-the-counter and prescription medicines only as told by your health care provider.  If directed, apply ice to the area: ? Put ice in a plastic bag. ? Place a towel between your skin and the bag. ? Leave the ice on for 20 minutes, 2-3 times per day. Stop applying ice if it does not help with the pain.  If you were given a shoulder sling or immobilizer: ? Wear it as told. ? Remove it to shower or bathe. ? Move your arm as little as possible, but keep your hand moving to prevent swelling.  Contact a health care provider if:  Your pain gets worse.  Your pain is not relieved with medicines.  New pain develops in your arm, hand, or fingers. Get  help right away if:  Your arm, hand, or fingers: ? Tingle. ? Become numb. ? Become swollen. ? Become painful. ? Turn white or blue. This information is not intended to replace advice given to you by your health care provider. Make sure you discuss any questions you have with your health care provider. Document Released: 01/31/2005 Document Revised: 12/18/2015 Document Reviewed: 08/16/2014 Elsevier Interactive Patient Education  2018 Elsevier Inc.  

## 2017-06-12 NOTE — Progress Notes (Signed)
Chief Complaint  Patient presents with  . 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  She has not been to physical therapy for her shoulder.   Past Medical History:  Diagnosis Date  . Anemia   . [place]tholin gland cyst 06/04/2000  . Cyst   . Diabetes mellitus   . FH: colon cancer   . Fibroids   . Glaucoma   . H/O dysmenorrhea 06/04/2000  . H/O fatigue   . H/O menorrhagia 2001  . H/O: obesity   . Hypertension   . Infertility, female   . Menopausal symptoms   . Varicose vein   . Yeast infection     Current Outpatient Medications  Medication Sig Dispense Refill  . aspirin 81 MG tablet Take 81 mg by mouth daily.    Marland Kitchen BYSTOLIC 20 MG TABS Take 1 tablet (20 mg total) by mouth daily. 90 tablet 3  . carvedilol (COREG) 25 MG tablet Take 1 tablet (25 mg total) by mouth daily. 90 tablet 3  . Continuous Blood Gluc Receiver (FREESTYLE LIBRE READER) DEVI USE AS DIRECTED ONCE 6 Device 3  . Continuous Blood Gluc Receiver (FREESTYLE LIBRE READER) DEVI USE AS DIRECTED ONCE 1 Device 0  . Continuous Blood Gluc Sensor (FREESTYLE LIBRE SENSOR SYSTEM) MISC 1 Device by Does not apply route once a week. 4 each 11  . cyclobenzaprine (FLEXERIL) 5 MG tablet TAKE 1 TABLET TWICE A DAY AS NEEDED FOR MUSCLE SPASM 180 tablet 1  . Dulaglutide (TRULICITY) 4.69 GE/9.5MW SOPN Inject 0.75 mg into the skin once a  week. 12 pen 3  . Empagliflozin (JARDIANCE PO) Take by mouth.    . estrogens, conjugated, (PREMARIN) 0.3 MG tablet Take 0.3 mg by mouth daily. Take daily for 21 days then do not take for 7 days.    . fluticasone furoate-vilanterol (BREO ELLIPTA) 200-25 MCG/INH AEPB Inhale 1 puff into the lungs daily. 90 each 1  . Fluticasone-Salmeterol (ADVAIR) 100-50 MCG/DOSE AEPB Inhale 1 puff into the lungs 2 (two) times daily. 3 each 0  . hydrochlorothiazide (HYDRODIURIL) 25 MG tablet Take 1 tablet (25 mg total) by mouth daily. 90 tablet 1  . Insulin Disposable Pump (V-GO 30) KIT USE 1 DEVICE DAILY 30 kit 11  . insulin regular (HUMULIN R) 250 units/2.8m (100 units/mL) injection For use in pump, total of 90 units per day 30 mL 11  . losartan-hydrochlorothiazide (HYZAAR) 100-12.5 MG per tablet Take 1 tablet by mouth daily. 90 tablet 3  . medroxyPROGESTERone (PROVERA) 5 MG tablet Take 5 mg by mouth daily.    . metFORMIN (GLUCOPHAGE) 1000 MG tablet Take 1 tablet (1,000 mg total) by mouth at bedtime. 90 tablet 1  . metoprolol succinate (TOPROL-XL) 100 MG 24 hr tablet TAKE 1 TABLET DAILY WITH/OR IMMEDIATELY FOLLOWING A MEAL 90  tablet 1  . montelukast (SINGULAIR) 10 MG tablet TAKE 1 TABLET AT BEDTIME.  "OV NEEDED FOR ADDITIONAL REFILLS" 30 tablet 11  . Multiple Vitamin (MULTIVITAMIN) tablet Take 1 tablet by mouth daily.    . ONE TOUCH ULTRA TEST test strip USE TO CHECK SUGAR THREE TIMES A DAY AS INSTRUCTED 300 each 3  . OSPHENA 60 MG TABS     . telmisartan (MICARDIS) 80 MG tablet Take 1 tablet (80 mg total) by mouth daily. 90 tablet 1  . Travoprost, BAK Free, (TRAVATAN) 0.004 % SOLN ophthalmic solution Place 1 drop into both eyes at bedtime.     Marland Kitchen zolpidem (AMBIEN) 10 MG tablet Take 1 tablet (10 mg total) by mouth at bedtime as needed for sleep. 90 tablet 1   No current facility-administered medications for this visit.     Allergies:  Allergies  Allergen Reactions  . Ace Inhibitors Cough    Past Surgical  History:  Procedure Laterality Date  . [place] GLAND CYST EXCISION  2010  . CESAREAN SECTION  [id number]  . DILATION AND CURETTAGE OF UTERUS  1989  . TUBAL LIGATION  1996   bilateral    Social History   Socioeconomic History  . Marital status: Married    Spouse name: Madelaine Etienne  . Number of children: None  . Years of education: None  . Highest education level: None  Social Needs  . Financial resource strain: None  . Food insecurity - worry: None  . Food insecurity - inability: None  . Transportation needs - medical: None  . Transportation needs - non-medical: None  Occupational History  . None  Tobacco Use  . Smoking status: Never Smoker  . Smokeless tobacco: Never Used  Substance and Sexual Activity  . Alcohol use: No    Alcohol/week: 0.0 oz  . Drug use: No  . Sexual activity: Yes    Partners: Male    Comment: number of sex partners in the last 24 months 1  Other Topics Concern  . None  Social History Narrative   Exercise walk 2-3 times for 30-40 minutes    Family History  Problem Relation Age of Onset  . Cancer Father        colon  . Cancer Mother        per patient stomach  . Diabetes Maternal Grandmother   . Mental illness Sister   . Heart disease Brother      ROS Review of Systems See HPI Constitution: No fevers or chills No malaise No diaphoresis Skin: No rash or itching Eyes: no blurry vision, no double vision GU: no dysuria or hematuria Neuro: no dizziness or headaches all others reviewed and negative   Objective: Vitals:   06/12/17 0924  BP: 120/68  Pulse: 81  Resp: 16  Temp: 98 F (36.7 C)  TempSrc: Oral  SpO2: 97%  Weight: 204 lb (92.5 kg)  Height: 5' 4.5" (1.638 m)   Physical Exam  Constitutional: She is oriented to person, place, and time. She appears well-developed and well-nourished.  Obese female  HENT:  Head: Normocephalic and atraumatic.  Eyes: Conjunctivae and EOM are normal.  Cardiovascular: Normal rate,  regular rhythm and normal heart sounds.  No murmur heard. Pulmonary/Chest: Effort normal and breath sounds normal. No stridor. No respiratory distress. She has no wheezes.  Musculoskeletal:       Right shoulder: She exhibits decreased range of motion, tenderness, crepitus and spasm. She exhibits no bony tenderness, no swelling, no effusion, no  deformity, no laceration, no pain, normal pulse and normal strength.       Left shoulder: She exhibits normal range of motion, no tenderness, no bony tenderness, no swelling, no effusion, no crepitus, no deformity, no laceration, no pain, no spasm, normal pulse and normal strength.       Lumbar back: She exhibits pain and spasm. She exhibits normal range of motion, no tenderness, no bony tenderness, no swelling, no edema, no deformity, no laceration and normal pulse.  Right shoulder with tenderness in the groove of the biceps Pain with reaching to touch her bra strap, limited range of motion with internal and external joint rotation Mild ac joint tenderness  Neurological: She is alert and oriented to person, place, and time. No cranial nerve deficit.  Psychiatric: She has a normal mood and affect. Her behavior is normal. Judgment and thought content normal.     Lab Results  Component Value Date   HGBA1C 7.5 03/25/2017   CLINICAL DATA:  Pain in the region of the right sacroiliac joint for the past 2 weeks. No known injury.  EXAM: BILATERAL SACROILIAC JOINTS - 3+ VIEW  COMPARISON:  Abdomen and pelvis radiograph dated 09/07/2014.  FINDINGS: The sacroiliac joints have normal appearances. There is some sclerosis on the iliac side of the left sacroiliac joint without significant change. Mild lower lumbar spine degenerative changes.  IMPRESSION: 1. Normal appearing right sacroiliac joint. 2. 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   Electronically Signed   By: Claudie Revering M.D.   On: 05/31/2015 07:55  CLINICAL DATA:  Right shoulder  pain  EXAM: RIGHT SHOULDER - 2+ VIEW  COMPARISON:  09/07/2014  FINDINGS: Degenerative spurring in the right AC and inferior glenohumeral joints. No acute bony abnormality. Specifically, no fracture, subluxation, or dislocation.  IMPRESSION: Early degenerative changes.  No acute bony abnormality.   Electronically Signed   By: Rolm Baptise M.D.   On: 06/12/2017 10:36    Assessment and Plan [place] was seen today for fmla paperwork.  Diagnoses and all orders for this visit:  Essential hypertension- bp in good ranges  Type 2 diabetes mellitus with hyperglycemia, with long-term current use of insulin ([place])- improved home BG with addition of trulicity Completed FMLA for diabetes  Chronic right shoulder pain -     Ambulatory referral to Physical Therapy -     DG Shoulder Right; Future  Decreased range of motion of right shoulder- referred to PT and provided work restrictions -     DG Shoulder Right; Future  Chronic bilateral low back pain without sciatica- follow up with PT Work restrictions provided -     Ambulatory referral to [facility]

## 2017-06-17 NOTE — Telephone Encounter (Signed)
Patient came in for OV paperwork was completed during the [place] by provider. I was provided with half of the forms they are scanned in pts chart.

## 2017-06-25 ENCOUNTER — Telehealth: Payer: Self-pay | Admitting: Family Medicine

## 2017-06-25 DIAGNOSIS — G8929 Other chronic pain: Secondary | ICD-10-CM

## 2017-06-25 DIAGNOSIS — M5441 Lumbago with sciatica, right side: Principal | ICD-10-CM

## 2017-06-25 DIAGNOSIS — Z0271 Encounter for disability determination: Secondary | ICD-10-CM

## 2017-06-25 DIAGNOSIS — M5442 Lumbago with sciatica, left side: Principal | ICD-10-CM

## 2017-06-25 NOTE — Telephone Encounter (Signed)
[facility] called and stated they do not see chronic pain. They stated [facility] may be a good option for pt. Do we want to try [facility] or is there another office we can send referral to? Please advise. Thanks!

## 2017-06-27 NOTE — Telephone Encounter (Signed)
Yes lets try [facility]

## 2017-07-02 ENCOUNTER — Other Ambulatory Visit: Payer: Self-pay | Admitting: Family Medicine

## 2017-07-02 NOTE — Telephone Encounter (Signed)
Copied from [facility]. Topic: Quick Communication - Rx Refill/Question >> Jul 02, 2017  1:03 PM Danielys Madry, Doylene Canard E, NT wrote: Medication:hydrochlorothiazide (HYDRODIURIL) 25 MG tablet,  BYSTOLIC 20 MG TABS and amlodipine besylate 5 mg   Has the patient contacted their pharmacy? Yes    (Agent: If no, request that the patient contact the pharmacy for the refill.)   Preferred Pharmacy (with phone number or street name): [facility], [place] [phone] (Phone) [phone] (Fax)     Agent: Please be advised that RX refills may take up to 3 business days. We ask that you follow-up with your pharmacy.

## 2017-07-03 NOTE — Telephone Encounter (Signed)
Refill request for these meds:  Bystolic  Last refilled 71/21/97 for 90 tabs Hydrochlorothiazide  Last refilled 05/17/17 for 90 tabs Also amlodipine which no med found for this one  LOV 06/12/17 NOV 09/23/17  Provider Delia Chimes, MD  Pharmacy [facility]  Please review.

## 2017-07-04 NOTE — Telephone Encounter (Signed)
Lm on vm to return my call in regards to medication refills for bystolic, hctz and amlodipine. Dgaddy, CMA

## 2017-08-06 ENCOUNTER — Other Ambulatory Visit: Payer: Self-pay | Admitting: Family Medicine

## 2017-08-06 NOTE — Telephone Encounter (Signed)
Patient called to ask about Jardiance, who filled it last and is she still taking, she says "yes, I still take it and I have 2 pills left. Dr. Criss Rosales, my old doctor, prescribed it and the refills have run out, so I'm calling to see if Dr. Nolon Rod can order it, a 30 day supply to [facility]." I advised this would be sent to Dr. Nolon Rod and that it can take up to 3 days before refilling, she verbalized understanding.  Last OV: 06/12/17 PCP: [facility]: [place], [place] [phone] (Phone) [phone] (Fax)

## 2017-08-06 NOTE — Telephone Encounter (Signed)
Copied from [place] [id number]. Topic: Quick Communication - See Telephone Encounter >> Aug 06, 2017  8:45 AM [place] Rieger wrote: CRM for notification. See Telephone encounter for: 08/06/17.  Empagliflozin (JARDIANCE PO) 25mg   Said the pharmacy has already faxed a request and is not getting a response [place], [place]

## 2017-08-07 ENCOUNTER — Ambulatory Visit: Payer: 59 | Admitting: Endocrinology

## 2017-08-07 ENCOUNTER — Encounter: Payer: Self-pay | Admitting: Endocrinology

## 2017-08-07 VITALS — BP 110/68 | HR 71 | Ht 64.5 in | Wt 206.0 lb

## 2017-08-07 DIAGNOSIS — Z794 Long term (current) use of insulin: Secondary | ICD-10-CM

## 2017-08-07 DIAGNOSIS — E1165 Type 2 diabetes mellitus with hyperglycemia: Secondary | ICD-10-CM

## 2017-08-07 LAB — POCT GLYCOSYLATED HEMOGLOBIN (HGB A1C): Hemoglobin A1C: 7.1

## 2017-08-07 MED ORDER — EMPAGLIFLOZIN 25 MG PO TABS: 25.0000 mg | ORAL_TABLET | Freq: Every day | ORAL | 1 refills | Status: DC

## 2017-08-07 MED ORDER — DULAGLUTIDE 1.5 MG/0.5ML ~~LOC~~ SOAJ: 1.5000 mg | SUBCUTANEOUS | 3 refills | Status: DC

## 2017-08-07 MED ORDER — V-GO 20 KIT: 1.0000 | PACK | Freq: Every day | 11 refills | Status: DC

## 2017-08-07 NOTE — Patient Instructions (Addendum)
check your blood sugar twice a day.  vary the time of day when you check, between before the 3 meals, and at bedtime.  also check if you have symptoms of your blood sugar being too high or too low.  please keep a record of the readings and bring it to your next appointment here.  You can write it on any piece of paper.  please call us sooner if your blood sugar goes below 70, or if you have a lot of readings over 200.   Please come back for a follow-up appointment in 3 months.   I have sent a prescription to your pharmacy, to double the Trulicity.  Please also reduce the V-GO to a -20 size.  Please continue the same clicks: breakfast:2 lunch:1 and supper:4 clicks.

## 2017-08-07 NOTE — Progress Notes (Signed)
Subjective:    Patient ID: Heidi Howell, female    DOB: Aug 05, 1955, 62 y.o.   MRN: [id number]  HPI Pt returns for f/u of diabetes mellitus: DM type: Insulin-requiring type 2. Dx'ed: 2007.  Complications: none.  Therapy: insulin since 2014, metformin, and jardiance.   GDM: never DKA: never Severe hypoglycemia: never. Pancreatitis: never.  Other: she is pursuing weight-loss surgery; she started  V-GO-30 pump in early 2017; she has "freestyle libre" device.  Interval history:   She takes these clicks: breakfast: 2, lunch: 1 and supper: 4 (1 click is 2 units).  no cbg record, but states cbg's are seldom low, and these episodes are mild.  There is no trend throughout the day. Past Medical History:  Diagnosis Date  . Anemia   . Bartholin gland cyst 06/04/2000  . Cyst   . Diabetes mellitus   . FH: colon cancer   . Fibroids   . Glaucoma   . H/O dysmenorrhea 06/04/2000  . H/O fatigue   . H/O menorrhagia 2001  . H/O: obesity   . Hypertension   . Infertility, female   . Menopausal symptoms   . Varicose vein   . Yeast infection     Past Surgical History:  Procedure Laterality Date  . [place] GLAND CYST EXCISION  2010  . CESAREAN SECTION  [id number]  . DILATION AND CURETTAGE OF UTERUS  1989  . TUBAL LIGATION  1996   bilateral    Social History   Socioeconomic History  . Marital status: Married    Spouse name: Madelaine Etienne  . Number of children: Not on file  . Years of education: Not on file  . Highest education level: Not on file  Occupational History  . Not on file  Social Needs  . Financial resource strain: Not on file  . Food insecurity:    Worry: Not on file    Inability: Not on file  . Transportation needs:    Medical: Not on file    Non-medical: Not on file  Tobacco Use  . Smoking status: Never Smoker  . Smokeless tobacco: Never Used  Substance and Sexual Activity  . Alcohol use: No    Alcohol/week: 0.0 oz  . Drug use: No  . Sexual activity: Yes   Partners: Male    Comment: number of sex partners in the last 12 months 1  Lifestyle  . Physical activity:    Days per week: Not on file    Minutes per session: Not on file  . Stress: Not on file  Relationships  . Social connections:    Talks on phone: Not on file    Gets together: Not on file    Attends religious service: Not on file    Active member of club or organization: Not on file    Attends meetings of clubs or organizations: Not on file    Relationship status: Not on file  . Intimate partner violence:    Fear of current or ex partner: Not on file    Emotionally abused: Not on file    Physically abused: Not on file    Forced sexual activity: Not on file  Other Topics Concern  . Not on file  Social History Narrative   Exercise walk 2-3 times for 30-40 minutes    Current Outpatient Medications on File Prior to Visit  Medication Sig Dispense Refill  . aspirin 81 MG tablet Take 81 mg by mouth daily.    Marland Kitchen BYSTOLIC 20  MG TABS Take 1 tablet (20 mg total) by mouth daily. 90 tablet 3  . carvedilol (COREG) 25 MG tablet Take 1 tablet (25 mg total) by mouth daily. 90 tablet 3  . Continuous Blood Gluc Receiver (FREESTYLE LIBRE READER) DEVI USE AS DIRECTED ONCE 6 Device 3  . Continuous Blood Gluc Receiver (FREESTYLE LIBRE READER) DEVI USE AS DIRECTED ONCE 1 Device 0  . Continuous Blood Gluc Sensor (FREESTYLE LIBRE SENSOR SYSTEM) MISC 1 Device by Does not apply route once a week. 4 each 11  . cyclobenzaprine (FLEXERIL) 5 MG tablet TAKE 1 TABLET TWICE A DAY AS NEEDED FOR MUSCLE SPASM 180 tablet 1  . empagliflozin (JARDIANCE) 25 MG TABS tablet Take 25 mg by mouth daily. 90 tablet 1  . estrogens, conjugated, (PREMARIN) 0.3 MG tablet Take 0.3 mg by mouth daily. Take daily for 21 days then do not take for 7 days.    . fluticasone furoate-vilanterol (BREO ELLIPTA) 200-25 MCG/INH AEPB Inhale 1 puff into the lungs daily. 90 each 1  . Fluticasone-Salmeterol (ADVAIR) 100-50 MCG/DOSE AEPB Inhale  1 puff into the lungs 2 (two) times daily. 3 each 0  . hydrochlorothiazide (HYDRODIURIL) 25 MG tablet Take 1 tablet (25 mg total) by mouth daily. 90 tablet 1  . insulin regular (HUMULIN R) 250 units/2.26mL (100 units/mL) injection For use in pump, total of 90 units per day 30 mL 11  . losartan-hydrochlorothiazide (HYZAAR) 100-12.5 MG per tablet Take 1 tablet by mouth daily. 90 tablet 3  . medroxyPROGESTERone (PROVERA) 5 MG tablet Take 5 mg by mouth daily.    . metFORMIN (GLUCOPHAGE) 1000 MG tablet Take 1 tablet (1,000 mg total) by mouth at bedtime. 90 tablet 1  . metoprolol succinate (TOPROL-XL) 100 MG 24 hr tablet TAKE 1 TABLET DAILY WITH/OR IMMEDIATELY FOLLOWING A MEAL 90 tablet 1  . montelukast (SINGULAIR) 10 MG tablet TAKE 1 TABLET AT BEDTIME.  "OV NEEDED FOR ADDITIONAL REFILLS" 30 tablet 11  . Multiple Vitamin (MULTIVITAMIN) tablet Take 1 tablet by mouth daily.    . ONE TOUCH ULTRA TEST test strip USE TO CHECK SUGAR THREE TIMES A DAY AS INSTRUCTED 300 each 3  . OSPHENA 60 MG TABS     . telmisartan (MICARDIS) 80 MG tablet Take 1 tablet (80 mg total) by mouth daily. 90 tablet 1  . Travoprost, BAK Free, (TRAVATAN) 0.004 % SOLN ophthalmic solution Place 1 drop into both eyes at bedtime.     Marland Kitchen zolpidem (AMBIEN) 10 MG tablet Take 1 tablet (10 mg total) by mouth at bedtime as needed for sleep. 90 tablet 1   No current facility-administered medications on file prior to visit.     Allergies  Allergen Reactions  . Ace Inhibitors Cough    Family History  Problem Relation Age of Onset  . Cancer Father        colon  . Cancer Mother        per patient stomach  . Diabetes Maternal Grandmother   . Mental illness Sister   . Heart disease Brother     BP 110/68   Pulse 71   Ht 5' 4.5" (1.638 m)   Wt 206 lb (93.4 kg)   LMP 01/19/2011   SpO2 98%   BMI 34.81 kg/m    Review of Systems She denies LOC    Objective:   Physical Exam VITAL SIGNS:  See vs page GENERAL: no distress Pulses:  foot pulses are intact bilaterally.   MSK: no deformity of the feet  or ankles.  CV: trace bilat edema of the legs or ankles Skin:  no ulcer on the feet or ankles.  normal color and temp on the feet and ankles Neuro: sensation is intact to touch on the feet and ankles.    Lab Results  Component Value Date   HGBA1C 7.1 08/07/2017       Assessment & Plan:  Insulin-requiring type 2 DM: Based on the pattern of her cbg's, she needs some adjustment in her therapy.    Patient Instructions  check your blood sugar twice a day.  vary the time of day when you check, between before the 3 meals, and at bedtime.  also check if you have symptoms of your blood sugar being too high or too low.  please keep a record of the readings and bring it to your next appointment here.  You can write it on any piece of paper.  please call us sooner if your blood sugar goes below 70, or if you have a lot of readings over 200.   Please come back for a follow-up appointment in 3 months.   I have sent a prescription to your pharmacy, to double the Trulicity.  Please also reduce the V-GO to a -20 size.  Please continue the same clicks: breakfast:2 lunch:1 and supper:4 clicks.

## 2017-08-07 NOTE — Telephone Encounter (Signed)
Refill request for Jardiance 25 mg #90 with one refill approved.  Pt last seen 06/12/17.  Please call and schedule dm f/u. Dgaddy, CMA

## 2017-09-03 ENCOUNTER — Other Ambulatory Visit: Payer: Self-pay | Admitting: Endocrinology

## 2017-09-22 NOTE — Progress Notes (Signed)
Chief Complaint  Patient presents with  . Hypertension    f/u  and right shoulder    HPI   Hypertension: Patient here for follow-up of elevated blood pressure. She is not exercising and is adherent to low salt diet.  Blood pressure is well controlled at home. Cardiac symptoms none. Patient denies chest pain, chest pressure/discomfort, dyspnea, exertional chest pressure/discomfort, irregular heart beat and lower extremity edema.  Cardiovascular risk factors: diabetes mellitus, dyslipidemia, hypertension, obesity (BMI >= 30 kg/m2) and sedentary lifestyle. She monitors her bp at home and is wondering if she could decrease her bp medication. History of target organ damage: none. BP Readings from Last 3 Encounters:  09/23/17 102/67  08/07/17 110/68  06/12/17 120/68    Diabetes Mellitus Type II, Follow-up: Patient here for follow-up of Type 2 diabetes mellitus.  Current symptoms/problems include none. She is adherent to a diabetic diet and denies hypoglycemia.  She states that her Endocrinologist told her that she had an improved diabetes lab.  She is not exercising much due to fatigue.   Known diabetic complications: none Cardiovascular risk factors: diabetes mellitus, dyslipidemia, family history of premature cardiovascular disease, hypertension and obesity (BMI >= 30 kg/m2) Current diabetic medications include trulicity  Eye exam current (within one year): yes Weight trend: decreasing steadily Prior visit with dietician: no Current diet: in general, a "healthy" diet  , diabetic  Wt Readings from Last 3 Encounters:  09/23/17 201 lb (91.2 kg)  08/07/17 206 lb (93.4 kg)  06/12/17 204 lb (92.5 kg)    Current monitoring regimen: home blood tests - daily Home blood sugar records: fasting range: <120s Any episodes of hypoglycemia? no  Is She on ACE inhibitor or angiotensin II receptor blocker?  Yes  allergic telmisartan (Micardis)  Lab Results  Component Value Date   HGBA1C 7.1  08/07/2017   The 10-year ASCVD risk score Mikey Bussing DC Jr., et al., 2013) is: 6.5%   Values used to calculate the score:     Age: 6 years     Sex: Female     Is Non-Hispanic African American: Yes     Diabetic: Yes     Tobacco smoker: No     Systolic Blood Pressure: 938 mmHg     Is BP treated: Yes     HDL Cholesterol: 52 mg/dL     Total Cholesterol: 147 mg/dL   Right Shoulder Pain Pt reports that has slight improvement in her right shoulder but the pain is still present.  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 Anemia   . Bartholin gland cyst 06/04/2000  . Cyst   . Diabetes mellitus   . FH: colon cancer   . Fibroids   . Glaucoma   . H/O dysmenorrhea 06/04/2000  . H/O fatigue   . H/O menorrhagia 2001  . H/O: obesity   . Hypertension   . Infertility, female   . Menopausal symptoms   . Varicose vein   . Yeast infection     Current Outpatient Medications  Medication Sig Dispense Refill  . aspirin 81 MG tablet Take 81 mg by mouth daily.    Marland Kitchen  BYSTOLIC 20 MG TABS Take 1 tablet (20 mg total) by mouth daily. 90 tablet 3  . carvedilol (COREG) 25 MG tablet Take 1 tablet (25 mg total) by mouth daily. 90 tablet 3  . Continuous Blood Gluc Receiver (FREESTYLE LIBRE READER) DEVI USE AS DIRECTED ONCE 6 Device 3  . Continuous Blood Gluc Receiver (FREESTYLE LIBRE READER) DEVI USE AS DIRECTED ONCE 1 Device 0  . Continuous Blood Gluc Sensor (FREESTYLE LIBRE SENSOR SYSTEM) MISC 1 Device by Does not apply route once a week. 4 each 11  . cyclobenzaprine (FLEXERIL) 5 MG tablet TAKE 1 TABLET TWICE A DAY AS NEEDED FOR MUSCLE SPASM 180 tablet 1  . Dulaglutide (TRULICITY) 1.5  GG/8.3MO SOPN Inject 1.5 mg into the skin once a week. 12 pen 3  . empagliflozin (JARDIANCE) 25 MG TABS tablet Take 25 mg by mouth daily. 90 tablet 1  . estrogens, conjugated, (PREMARIN) 0.3 MG tablet Take 0.3 mg by mouth daily. Take daily for 21 days then do not take for 7 days.    . fluticasone furoate-vilanterol (BREO ELLIPTA) 200-25 MCG/INH AEPB Inhale 1 puff into the lungs daily. 90 each 1  . Fluticasone-Salmeterol (ADVAIR) 100-50 MCG/DOSE AEPB Inhale 1 puff into the lungs 2 (two) times daily. 3 each 0  . Insulin Disposable Pump (V-GO 20) KIT 1 Device by Does not apply route daily. 30 kit 11  . insulin regular (HUMULIN R) 100 units/mL injection FOR USE IN PUMP, TOTAL OF 90 UNITS PER DAY 30 mL 11  . losartan-hydrochlorothiazide (HYZAAR) 100-12.5 MG tablet Take 0.5 tablets by mouth daily. 90 tablet 3  . medroxyPROGESTERone (PROVERA) 5 MG tablet Take 5 mg by mouth daily.    . metFORMIN (GLUCOPHAGE) 1000 MG tablet Take 1 tablet (1,000 mg total) by mouth at bedtime. 90 tablet 1  . metoprolol succinate (TOPROL-XL) 100 MG 24 hr tablet TAKE 1 TABLET DAILY WITH/OR IMMEDIATELY FOLLOWING A MEAL 90 tablet 1  . montelukast (SINGULAIR) 10 MG tablet TAKE 1 TABLET AT BEDTIME.  "OV NEEDED FOR ADDITIONAL REFILLS" 30 tablet 11  . Multiple Vitamin (MULTIVITAMIN) tablet Take 1 tablet by mouth daily.    . ONE TOUCH ULTRA TEST test strip USE TO CHECK SUGAR THREE TIMES A DAY AS INSTRUCTED 300 each 3  . OSPHENA 60 MG TABS     . telmisartan (MICARDIS) 80 MG tablet Take 1 tablet (80 mg total) by mouth daily. 90 tablet 1  . Travoprost, BAK Free, (TRAVATAN) 0.004 % SOLN ophthalmic solution Place 1 drop into both eyes at bedtime.     . hydrochlorothiazide (HYDRODIURIL) 25 MG tablet Take 1 tablet (25 mg total) by mouth daily. (Patient not taking: Reported on 09/23/2017) 90 tablet 1  . zolpidem (AMBIEN) 10 MG tablet Take 1 tablet (10 mg total) by mouth at bedtime as needed for sleep. (Patient not taking: Reported on  09/23/2017) 90 tablet 1   No current facility-administered medications for this visit.     Allergies:  Allergies  Allergen Reactions  . Ace Inhibitors Cough    Past Surgical History:  Procedure Laterality Date  . [place] GLAND CYST EXCISION  2010  . CESAREAN SECTION  [id number]  . DILATION AND CURETTAGE OF UTERUS  1989  . TUBAL LIGATION  1996   bilateral    Social History   Socioeconomic History  . Marital status: Married    Spouse name: Madelaine Etienne  . Number of children: Not on file  . Years of education: Not on file  . Highest  education level: Not on file  Occupational History  . Not on file  Social Needs  . Financial resource strain: Not on file  . Food insecurity:    Worry: Not on file    Inability: Not on file  . Transportation needs:    Medical: Not on file    Non-medical: Not on file  Tobacco Use  . Smoking status: Never Smoker  . Smokeless tobacco: Never Used  Substance and Sexual Activity  . Alcohol use: No    Alcohol/week: 0.0 oz  . Drug use: No  . Sexual activity: Yes    Partners: Male    Comment: number of sex partners in the last 12 months 1  Lifestyle  . Physical activity:    Days per week: Not on file    Minutes per session: Not on file  . Stress: Not on file  Relationships  . Social connections:    Talks on phone: Not on file    Gets together: Not on file    Attends religious service: Not on file    Active member of club or organization: Not on file    Attends meetings of clubs or organizations: Not on file    Relationship status: Not on file  Other Topics Concern  . Not on file  Social History Narrative   Exercise walk 2-3 times for 30-40 minutes    Family History  Problem Relation Age of Onset  . Cancer Father        colon  . Cancer Mother        per patient stomach  . Diabetes Maternal Grandmother   . Mental illness Sister   . Heart disease Brother      ROS Review of Systems See HPI Constitution: No fevers or  chills No malaise No diaphoresis Skin: No rash or itching Eyes: no blurry vision, no double vision GU: no dysuria or hematuria Neuro: no dizziness or headaches all others reviewed and negative   Objective: Vitals:   09/23/17 0855  BP: 102/67  Pulse: 74  Resp: 16  Temp: 98.7 F (37.1 C)  TempSrc: Oral  SpO2: 98%  Weight: 201 lb (91.2 kg)  Height: 5' 4.5" (1.638 m)    Physical Exam  Constitutional: She is oriented to person, place, and time. She appears well-developed and well-nourished.  HENT:  Head: Normocephalic and atraumatic.  Eyes: Conjunctivae and EOM are normal.  Cardiovascular: Normal rate, regular rhythm and normal heart sounds.  No murmur heard. Pulmonary/Chest: Effort normal and breath sounds normal. No stridor. No respiratory distress. She has no wheezes.  Neurological: She is alert and oriented to person, place, and time.  Psychiatric: She has a normal mood and affect. Her behavior is normal. Judgment and thought content normal.     Assessment and Plan Heidi Howell was seen today for hypertension.  Diagnoses and all orders for this visit:     Problem List Items Addressed This Visit      Cardiovascular and Mediastinum   Essential hypertension    bp in good range, very well controlled Since pt has fatigue will 1/2 her dose of hyzaar  Continue DASH diet      Relevant Medications   losartan-hydrochlorothiazide (HYZAAR) 100-12.5 MG tablet     Endocrine   Type 2 diabetes mellitus without complication, with long-term current use of insulin ([place])    Continue trulicity. Follow Endocrinology recommendations.       Relevant Medications   losartan-hydrochlorothiazide (HYZAAR) 100-12.5 MG tablet  Other   Chronic right shoulder pain - Primary    Completed work note recommending continued light duty for 3 months. Discussed that she should talk with HR to review options for her since she is probably not going to have substantial improvement in range of  movement even with PT.  She will.       Long-term use of aspirin therapy    Discussed ASA guidelines, risks and benefits. Pt currently not experiencing side effects thus opted to continue her baby asa.          [place]

## 2017-09-23 ENCOUNTER — Encounter: Payer: Self-pay | Admitting: Family Medicine

## 2017-09-23 ENCOUNTER — Ambulatory Visit: Payer: 59 | Admitting: Family Medicine

## 2017-09-23 ENCOUNTER — Other Ambulatory Visit: Payer: Self-pay

## 2017-09-23 VITALS — BP 102/67 | HR 74 | Temp 98.7°F | Resp 16 | Ht 64.5 in | Wt 201.0 lb

## 2017-09-23 DIAGNOSIS — Z7982 Long term (current) use of aspirin: Secondary | ICD-10-CM | POA: Diagnosis not present

## 2017-09-23 DIAGNOSIS — I10 Essential (primary) hypertension: Secondary | ICD-10-CM | POA: Diagnosis not present

## 2017-09-23 DIAGNOSIS — M25511 Pain in right shoulder: Secondary | ICD-10-CM

## 2017-09-23 DIAGNOSIS — E119 Type 2 diabetes mellitus without complications: Secondary | ICD-10-CM

## 2017-09-23 DIAGNOSIS — G8929 Other chronic pain: Secondary | ICD-10-CM | POA: Diagnosis not present

## 2017-09-23 DIAGNOSIS — Z794 Long term (current) use of insulin: Secondary | ICD-10-CM

## 2017-09-23 MED ORDER — LOSARTAN POTASSIUM-HCTZ 100-12.5 MG PO TABS: 0.5000 | ORAL_TABLET | Freq: Every day | ORAL | 3 refills | Status: DC

## 2017-09-23 NOTE — Assessment & Plan Note (Signed)
Continue trulicity. Follow Endocrinology recommendations.

## 2017-09-23 NOTE — Assessment & Plan Note (Signed)
Discussed ASA guidelines, risks and benefits. Pt currently not experiencing side effects thus opted to continue her baby asa.

## 2017-09-23 NOTE — Assessment & Plan Note (Signed)
bp in good range, very well controlled Since pt has fatigue will 1/2 her dose of hyzaar  Continue DASH diet

## 2017-09-23 NOTE — Assessment & Plan Note (Signed)
Completed work note recommending continued light duty for 3 months. Discussed that she should talk with HR to review options for her since she is probably not going to have substantial improvement in range of movement even with PT.  She will.

## 2017-09-23 NOTE — Patient Instructions (Signed)
     IF you received an x-ray today, you will receive an invoice from [facility]. Please contact [facility] at [phone] with questions or concerns regarding your invoice.   IF you received labwork today, you will receive an invoice from [facility]. Please contact [facility] at [phone] with questions or concerns regarding your invoice.   Our billing staff will not be able to assist you with questions regarding bills from these companies.  You will be contacted with the lab results as soon as they are available. The fastest way to get your results is to activate your My Chart account. Instructions are located on the last page of this paperwork. If you have not heard from us regarding the results in 2 weeks, please contact this office.     

## 2017-10-08 ENCOUNTER — Other Ambulatory Visit: Payer: Self-pay | Admitting: Family Medicine

## 2017-10-08 DIAGNOSIS — IMO0002 Reserved for concepts with insufficient information to code with codable children: Secondary | ICD-10-CM

## 2017-10-08 DIAGNOSIS — E1165 Type 2 diabetes mellitus with hyperglycemia: Secondary | ICD-10-CM

## 2017-10-10 NOTE — Telephone Encounter (Signed)
Refill request Metformin 1000 mg  LOV 09/23/2017 Dr Nolon Rod shoulder pain  Last Filled 05/02/2017 90 tabs 1 refill Dr Nolon Rod  Pharmacy on file

## 2017-10-15 NOTE — Telephone Encounter (Signed)
Refill request for metformin 1000 mg # 90 with no refills approved. Dgaddy, CMA

## 2017-11-14 ENCOUNTER — Other Ambulatory Visit: Payer: Self-pay | Admitting: Family Medicine

## 2017-11-19 ENCOUNTER — Encounter: Payer: Self-pay | Admitting: Endocrinology

## 2017-11-19 ENCOUNTER — Ambulatory Visit (INDEPENDENT_AMBULATORY_CARE_PROVIDER_SITE_OTHER): Payer: 59 | Admitting: Endocrinology

## 2017-11-19 VITALS — BP 122/64 | HR 74 | Ht 64.5 in | Wt 200.0 lb

## 2017-11-19 DIAGNOSIS — E1165 Type 2 diabetes mellitus with hyperglycemia: Secondary | ICD-10-CM

## 2017-11-19 DIAGNOSIS — M79671 Pain in right foot: Secondary | ICD-10-CM | POA: Insufficient documentation

## 2017-11-19 DIAGNOSIS — Z794 Long term (current) use of insulin: Secondary | ICD-10-CM

## 2017-11-19 DIAGNOSIS — M79672 Pain in left foot: Secondary | ICD-10-CM | POA: Insufficient documentation

## 2017-11-19 LAB — POCT GLYCOSYLATED HEMOGLOBIN (HGB A1C): Hemoglobin A1C: 6.5 % — AB (ref 4.0–5.6)

## 2017-11-19 NOTE — Patient Instructions (Addendum)
check your blood sugar twice a day.  vary the time of day when you check, between before the 3 meals, and at bedtime.  also check if you have symptoms of your blood sugar being too high or too low.  please keep a record of the readings and bring it to your next appointment here.  You can write it on any piece of paper.  please call us sooner if your blood sugar goes below 70, or if you have a lot of readings over 200.   Please come back for a follow-up appointment in 3 months.  Please see a foot specialist.  you will receive a phone call, about a day and time for an appointment  Please continue the same Trulicity.  Please continue the same clicks: breakfast:2 lunch:1 and supper:4 clicks.

## 2017-11-19 NOTE — Progress Notes (Signed)
Subjective:    Patient ID: Heidi Howell, female    DOB: 1956/01/26, 62 y.o.   MRN: [id number]  HPI Pt returns for f/u of diabetes mellitus: DM type: Insulin-requiring type 2. Dx'ed: 2007.  Complications: none.  Therapy: insulin since 2014, metformin, and jardiance.   GDM: never DKA: never Severe hypoglycemia: never. Pancreatitis: never.  Other: she is pursuing weight-loss surgery; she started  V-GO-20 pump in early 2017; she has "freestyle libre" device.  Interval history:   She takes these clicks: breakfast: 2, lunch: 1 and supper: 4 (1 click is 2 units).  no cbg record, but states cbg's vary from 94-200.   There is little if any trend throughout the day.  pt states she feels well in general, except for bilat heel pain Past Medical History:  Diagnosis Date  . Anemia   . Bartholin gland cyst 06/04/2000  . Cyst   . Diabetes mellitus   . FH: colon cancer   . Fibroids   . Glaucoma   . H/O dysmenorrhea 06/04/2000  . H/O fatigue   . H/O menorrhagia 2001  . H/O: obesity   . Hypertension   . Infertility, female   . Menopausal symptoms   . Varicose vein   . Yeast infection     Past Surgical History:  Procedure Laterality Date  . [place] GLAND CYST EXCISION  2010  . CESAREAN SECTION  [id number]  . DILATION AND CURETTAGE OF UTERUS  1989  . TUBAL LIGATION  1996   bilateral    Social History   Socioeconomic History  . Marital status: Married    Spouse name: Madelaine Etienne  . Number of children: Not on file  . Years of education: Not on file  . Highest education level: Not on file  Occupational History  . Not on file  Social Needs  . Financial resource strain: Not on file  . Food insecurity:    Worry: Not on file    Inability: Not on file  . Transportation needs:    Medical: Not on file    Non-medical: Not on file  Tobacco Use  . Smoking status: Never Smoker  . Smokeless tobacco: Never Used  Substance and Sexual Activity  . Alcohol use: No    Alcohol/week:  0.0 oz  . Drug use: No  . Sexual activity: Yes    Partners: Male    Comment: number of sex partners in the last 12 months 1  Lifestyle  . Physical activity:    Days per week: Not on file    Minutes per session: Not on file  . Stress: Not on file  Relationships  . Social connections:    Talks on phone: Not on file    Gets together: Not on file    Attends religious service: Not on file    Active member of club or organization: Not on file    Attends meetings of clubs or organizations: Not on file    Relationship status: Not on file  . Intimate partner violence:    Fear of current or ex partner: Not on file    Emotionally abused: Not on file    Physically abused: Not on file    Forced sexual activity: Not on file  Other Topics Concern  . Not on file  Social History Narrative   Exercise walk 2-3 times for 30-40 minutes    Current Outpatient Medications on File Prior to Visit  Medication Sig Dispense Refill  . aspirin 81 MG  tablet Take 81 mg by mouth daily.    Marland Kitchen BYSTOLIC 20 MG TABS Take 1 tablet (20 mg total) by mouth daily. 90 tablet 3  . carvedilol (COREG) 25 MG tablet Take 1 tablet (25 mg total) by mouth daily. 90 tablet 3  . Continuous Blood Gluc Receiver (FREESTYLE LIBRE READER) DEVI USE AS DIRECTED ONCE 6 Device 3  . Continuous Blood Gluc Receiver (FREESTYLE LIBRE READER) DEVI USE AS DIRECTED ONCE 1 Device 0  . Continuous Blood Gluc Sensor (FREESTYLE LIBRE SENSOR SYSTEM) MISC 1 Device by Does not apply route once a week. 4 each 11  . cyclobenzaprine (FLEXERIL) 5 MG tablet TAKE 1 TABLET TWICE A DAY AS NEEDED FOR MUSCLE SPASM 180 tablet 1  . Dulaglutide (TRULICITY) 1.5 ZW/2.5EN SOPN Inject 1.5 mg into the skin once a week. 12 pen 3  . empagliflozin (JARDIANCE) 25 MG TABS tablet Take 25 mg by mouth daily. 90 tablet 1  . estrogens, conjugated, (PREMARIN) 0.3 MG tablet Take 0.3 mg by mouth daily. Take daily for 21 days then do not take for 7 days.    . fluticasone  furoate-vilanterol (BREO ELLIPTA) 200-25 MCG/INH AEPB Inhale 1 puff into the lungs daily. 90 each 1  . Fluticasone-Salmeterol (ADVAIR) 100-50 MCG/DOSE AEPB Inhale 1 puff into the lungs 2 (two) times daily. 3 each 0  . hydrochlorothiazide (HYDRODIURIL) 25 MG tablet Take 1 tablet (25 mg total) by mouth daily. 90 tablet 1  . Insulin Disposable Pump (V-GO 20) KIT 1 Device by Does not apply route daily. 30 kit 11  . insulin regular (HUMULIN R) 100 units/mL injection FOR USE IN PUMP, TOTAL OF 90 UNITS PER DAY 30 mL 11  . losartan-hydrochlorothiazide (HYZAAR) 100-12.5 MG tablet Take 0.5 tablets by mouth daily. 90 tablet 3  . medroxyPROGESTERone (PROVERA) 5 MG tablet Take 5 mg by mouth daily.    . metFORMIN (GLUCOPHAGE) 1000 MG tablet TAKE 1 TABLET AT BEDTIME 90 tablet 0  . metoprolol succinate (TOPROL-XL) 100 MG 24 hr tablet TAKE 1 TABLET DAILY WITH/OR IMMEDIATELY FOLLOWING A MEAL 90 tablet 1  . montelukast (SINGULAIR) 10 MG tablet TAKE 1 TABLET AT BEDTIME.  "OV NEEDED FOR ADDITIONAL REFILLS" 30 tablet 11  . Multiple Vitamin (MULTIVITAMIN) tablet Take 1 tablet by mouth daily.    . ONE TOUCH ULTRA TEST test strip USE TO CHECK SUGAR THREE TIMES A DAY AS INSTRUCTED 300 each 3  . OSPHENA 60 MG TABS     . telmisartan (MICARDIS) 80 MG tablet TAKE 1 TABLET DAILY 90 tablet 0  . Travoprost, BAK Free, (TRAVATAN) 0.004 % SOLN ophthalmic solution Place 1 drop into both eyes at bedtime.     Marland Kitchen zolpidem (AMBIEN) 10 MG tablet Take 1 tablet (10 mg total) by mouth at bedtime as needed for sleep. 90 tablet 1   No current facility-administered medications on file prior to visit.     Allergies  Allergen Reactions  . Ace Inhibitors Cough    Family History  Problem Relation Age of Onset  . Cancer Father        colon  . Cancer Mother        per patient stomach  . Diabetes Maternal Grandmother   . Mental illness Sister   . Heart disease Brother     BP 122/64   Pulse 74   Ht 5' 4.5" (1.638 m)   Wt 200 lb (90.7  kg)   LMP 01/19/2011   SpO2 97%   BMI 33.80 kg/m  Review of Systems She has lost a few lbs.  She denies hypoglycemia    Objective:   Physical Exam VITAL SIGNS:  See vs page GENERAL: no distress Pulses: dorsalis pedis intact bilat.   MSK: no deformity of the feet CV: no leg edema Skin:  no ulcer on the feet.  normal color and temp on the feet. Neuro: sensation is intact to touch on the feet   A1c=6.5%     Assessment & Plan:  Insulin-requiring type 2 DM: well-controlled.    Heel pain, new, uncertain etiology.    Patient Instructions  check your blood sugar twice a day.  vary the time of day when you check, between before the 3 meals, and at bedtime.  also check if you have symptoms of your blood sugar being too high or too low.  please keep a record of the readings and bring it to your next appointment here.  You can write it on any piece of paper.  please call us sooner if your blood sugar goes below 70, or if you have a lot of readings over 200.   Please come back for a follow-up appointment in 3 months.  Please see a foot specialist.  you will receive a phone call, about a day and time for an appointment  Please continue the same Trulicity.  Please continue the same clicks: breakfast:2 lunch:1 and supper:4 clicks.

## 2017-12-03 ENCOUNTER — Ambulatory Visit (INDEPENDENT_AMBULATORY_CARE_PROVIDER_SITE_OTHER): Payer: 59

## 2017-12-03 ENCOUNTER — Other Ambulatory Visit: Payer: Self-pay | Admitting: Sports Medicine

## 2017-12-03 ENCOUNTER — Ambulatory Visit (INDEPENDENT_AMBULATORY_CARE_PROVIDER_SITE_OTHER): Payer: 59 | Admitting: Sports Medicine

## 2017-12-03 ENCOUNTER — Encounter: Payer: Self-pay | Admitting: Sports Medicine

## 2017-12-03 VITALS — BP 125/59 | HR 71

## 2017-12-03 DIAGNOSIS — M79671 Pain in right foot: Secondary | ICD-10-CM | POA: Diagnosis not present

## 2017-12-03 DIAGNOSIS — E119 Type 2 diabetes mellitus without complications: Secondary | ICD-10-CM

## 2017-12-03 DIAGNOSIS — M722 Plantar fascial fibromatosis: Secondary | ICD-10-CM

## 2017-12-03 DIAGNOSIS — M79672 Pain in left foot: Principal | ICD-10-CM

## 2017-12-03 DIAGNOSIS — M2142 Flat foot [pes planus] (acquired), left foot: Secondary | ICD-10-CM

## 2017-12-03 DIAGNOSIS — M2141 Flat foot [pes planus] (acquired), right foot: Secondary | ICD-10-CM

## 2017-12-03 DIAGNOSIS — Z794 Long term (current) use of insulin: Secondary | ICD-10-CM

## 2017-12-03 NOTE — Progress Notes (Signed)
Subjective: Heidi Howell is a 62 y.o. female patient presents to office with complaint of moderate heel pain on the left>right. Patient admits to post static dyskinesia for 1 month in duration. Patient has treated this problem with insoles with no relief. Reports that she does have pain that is worse at work does lots of standing on concrete surfaces all day. Denies any other pedal complaints.   FBS 174 this AM  Review of Systems  Musculoskeletal: Positive for joint pain.  All other systems reviewed and are negative.    Patient Active Problem List   Diagnosis Date Noted  . Heel pain, bilateral 11/19/2017  . Chronic right shoulder pain 09/23/2017  . Long-term use of aspirin therapy 09/23/2017  . Type 2 diabetes mellitus without complication, with long-term current use of insulin ([place]) 07/14/2015  . Essential hypertension 09/23/2014  . Adverse drug effect 02/12/2014  . Cortical cataract 08/17/2013  . Primary open angle glaucoma 08/17/2013  . No diabetic retinopathy in both eyes 08/17/2013  . Hyperopia 08/17/2013  . Astigmatism 08/17/2013  . Presbyopia 08/17/2013  . Obesity 08/20/2011  . Symptomatic menopausal or female climacteric states 08/20/2011  . Insomnia 08/20/2011  . Fibroids 08/20/2011    Current Outpatient Medications on File Prior to Visit  Medication Sig Dispense Refill  . aspirin 81 MG tablet Take 81 mg by mouth daily.    Marland Kitchen BYSTOLIC 20 MG TABS Take 1 tablet (20 mg total) by mouth daily. 90 tablet 3  . carvedilol (COREG) 25 MG tablet Take 1 tablet (25 mg total) by mouth daily. 90 tablet 3  . Continuous Blood Gluc Receiver (FREESTYLE LIBRE READER) DEVI USE AS DIRECTED ONCE 6 Device 3  . Continuous Blood Gluc Sensor (FREESTYLE LIBRE SENSOR SYSTEM) MISC 1 Device by Does not apply route once a week. 4 each 11  . cyclobenzaprine (FLEXERIL) 5 MG tablet TAKE 1 TABLET TWICE A DAY AS NEEDED FOR MUSCLE SPASM 180 tablet 1  . Dulaglutide (TRULICITY) 1.5 FU/9.3AT SOPN Trulicity  1.5 FT/7.3 mL subcutaneous pen injector    . empagliflozin (JARDIANCE) 25 MG TABS tablet Take 25 mg by mouth daily. 90 tablet 1  . estrogens, conjugated, (PREMARIN) 0.3 MG tablet Take 0.3 mg by mouth daily. Take daily for 21 days then do not take for 7 days.    . fluticasone furoate-vilanterol (BREO ELLIPTA) 200-25 MCG/INH AEPB Inhale 1 puff into the lungs daily. 90 each 1  . Fluticasone-Salmeterol (ADVAIR) 100-50 MCG/DOSE AEPB Inhale 1 puff into the lungs 2 (two) times daily. 3 each 0  . hydrochlorothiazide (HYDRODIURIL) 25 MG tablet Take 1 tablet (25 mg total) by mouth daily. 90 tablet 1  . HYDROcodone-acetaminophen (NORCO/VICODIN) 5-325 MG tablet hydrocodone 5 mg-acetaminophen 325 mg tablet    . ibuprofen (ADVIL,MOTRIN) 800 MG tablet ibuprofen 800 mg tablet    . Insulin Disposable Pump (V-GO 20) KIT 1 Device by Does not apply route daily. 30 kit 11  . insulin regular (HUMULIN R) 100 units/mL injection FOR USE IN PUMP, TOTAL OF 90 UNITS PER DAY 30 mL 11  . losartan-hydrochlorothiazide (HYZAAR) 100-12.5 MG tablet Take 0.5 tablets by mouth daily. 90 tablet 3  . medroxyPROGESTERone (PROVERA) 5 MG tablet Take 5 mg by mouth daily.    . metFORMIN (GLUCOPHAGE) 1000 MG tablet TAKE 1 TABLET AT BEDTIME 90 tablet 0  . metoprolol succinate (TOPROL-XL) 100 MG 24 hr tablet TAKE 1 TABLET DAILY WITH/OR IMMEDIATELY FOLLOWING A MEAL 90 tablet 1  . montelukast (SINGULAIR) 10 MG tablet TAKE  1 TABLET AT BEDTIME.  "OV NEEDED FOR ADDITIONAL REFILLS" 30 tablet 11  . Multiple Vitamin (MULTIVITAMIN) tablet Take 1 tablet by mouth daily.    Marland Kitchen NYAMYC powder     . nystatin-triamcinolone (MYCOLOG II) cream     . ONE TOUCH ULTRA TEST test strip USE TO CHECK SUGAR THREE TIMES A DAY AS INSTRUCTED 300 each 3  . OSPHENA 60 MG TABS     . PROCTOSOL HC 2.5 % rectal cream     . telmisartan (MICARDIS) 80 MG tablet TAKE 1 TABLET DAILY 90 tablet 0  . Travoprost, BAK Free, (TRAVATAN) 0.004 % SOLN ophthalmic solution Place 1 drop into  both eyes at bedtime.     Marland Kitchen zolpidem (AMBIEN) 10 MG tablet Take 1 tablet (10 mg total) by mouth at bedtime as needed for sleep. 90 tablet 1   No current facility-administered medications on file prior to visit.     Allergies  Allergen Reactions  . Ace Inhibitors Cough    Objective: Physical Exam General: The patient is alert and oriented x3 in no acute distress.  Dermatology: Skin is warm, dry and supple bilateral lower extremities. Nails 1-10 are normal. There is no erythema, edema, no eccymosis, no open lesions present. Integument is otherwise unremarkable.  Vascular: Dorsalis Pedis pulse and Posterior Tibial pulse are 2/4 bilateral. Capillary fill time is immediate to all digits.  Neurological: Grossly intact to light touch with an achilles reflex of +2/5 and a negative Tinel's sign bilateral. Protective sensation intact bilateral.  Musculoskeletal: Tenderness to palpation at the medial calcaneal tubercale and through the insertion of the plantar fascia on the left>right foot. No pain with compression of calcaneus bilateral. No pain with tuning fork to calcaneus bilateral. No pain with calf compression bilateral. There is decreased Ankle joint range of motion bilateral. All other joints range of motion within normal limits bilateral. + Bunion and pes planus. Strength 5/5 in all groups bilateral.   Gait: Unassisted, Antalgic avoid weight on left/right heel  Xray, Right/Left foot:  Normal osseous mineralization. Joint spaces preserved except midtarsal supportive of pes planus and bunion. No fracture/dislocation/boney destruction. Calcaneal spur present with mild thickening of plantar fascia. No other soft tissue abnormalities or radiopaque foreign bodies.   Assessment and Plan: Problem List Items Addressed This Visit      Endocrine   Type 2 diabetes mellitus without complication, with long-term current use of insulin (HCC)   Relevant Medications   Dulaglutide (TRULICITY) 1.5  GB/2.0FE SOPN     Other   Heel pain, bilateral    Other Visit Diagnoses    Plantar fasciitis, bilateral    -  Primary   L>R   Pain in both feet       Relevant Orders   DG Foot Complete Right   DG Foot Complete Left   Pes planus of both feet          -Complete examination performed.  -Xrays reviewed -Discussed with patient in detail the condition of plantar fasciitis, how this occurs and general treatment options. Explained both conservative and surgical treatments.  -Patient declined injection or oral meds at this time -Recommended good supportive shoes and advised go to [employer] or [employer] - Explained in detail the use of the fascial braces which was dispensed at today's visit.If these help will benefit from new set of custom orthotics -Explained and dispensed to patient daily stretching exercises. -Recommend patient to ice affected area 1-2x daily. -Patient to return to office in 4  weeks for follow up or sooner if problems or questions arise.  Landis Martins, DPM

## 2017-12-25 ENCOUNTER — Other Ambulatory Visit: Payer: Self-pay | Admitting: Family Medicine

## 2017-12-25 DIAGNOSIS — G4700 Insomnia, unspecified: Secondary | ICD-10-CM

## 2017-12-25 NOTE — Telephone Encounter (Signed)
Copied from [place] [id number]. Topic: Quick Communication - Rx Refill/Question >> Dec 25, 2017  3:09 PM Cecelia Byars, NT wrote: Medication: zolpidem (AMBIEN) 10 MG tablet, patient would like a 90 day supply  Has the patient contacted their pharmacy? yes  (Agent: If no, request that the patient contact the pharmacy for the refill. (Agent: If yes, when and what did the pharmacy advise?  Preferred Pharmacy (with phone number or street name  [facility]  Agent: Please be advised that RX refills may take up to 3 business days. We ask that you follow-up with your pharmacy.

## 2017-12-25 NOTE — Telephone Encounter (Signed)
Refill of Ambien  LOV 09/23/17 Dr. Nolon Rod  [facility] 05/02/17  #90 1 refill     [facility]

## 2017-12-27 ENCOUNTER — Other Ambulatory Visit: Payer: Self-pay | Admitting: Family Medicine

## 2017-12-27 DIAGNOSIS — E1165 Type 2 diabetes mellitus with hyperglycemia: Secondary | ICD-10-CM

## 2017-12-27 DIAGNOSIS — IMO0002 Reserved for concepts with insufficient information to code with codable children: Secondary | ICD-10-CM

## 2017-12-28 MED ORDER — ZOLPIDEM TARTRATE 10 MG PO TABS: 10.0000 mg | ORAL_TABLET | Freq: Every evening | ORAL | 1 refills | Status: DC | PRN

## 2017-12-30 ENCOUNTER — Telehealth: Payer: Self-pay | Admitting: Family Medicine

## 2017-12-30 ENCOUNTER — Encounter: Payer: Self-pay | Admitting: Family Medicine

## 2017-12-30 ENCOUNTER — Other Ambulatory Visit: Payer: Self-pay

## 2017-12-30 ENCOUNTER — Ambulatory Visit: Payer: 59 | Admitting: Family Medicine

## 2017-12-30 VITALS — BP 120/69 | HR 75 | Temp 98.4°F | Resp 17 | Ht 64.5 in | Wt 201.2 lb

## 2017-12-30 DIAGNOSIS — E119 Type 2 diabetes mellitus without complications: Secondary | ICD-10-CM

## 2017-12-30 DIAGNOSIS — M25511 Pain in right shoulder: Secondary | ICD-10-CM

## 2017-12-30 DIAGNOSIS — I10 Essential (primary) hypertension: Secondary | ICD-10-CM

## 2017-12-30 DIAGNOSIS — Z794 Long term (current) use of insulin: Secondary | ICD-10-CM | POA: Diagnosis not present

## 2017-12-30 DIAGNOSIS — G8929 Other chronic pain: Secondary | ICD-10-CM

## 2017-12-30 LAB — COMPREHENSIVE METABOLIC PANEL
ALK PHOS: 88 IU/L (ref 39–117)
ALT: 21 IU/L (ref 0–32)
AST: 17 IU/L (ref 0–40)
Albumin/Globulin Ratio: 1.7 (ref 1.2–2.2)
Albumin: 4.3 g/dL (ref 3.6–4.8)
BILIRUBIN TOTAL: 0.5 mg/dL (ref 0.0–1.2)
BUN/Creatinine Ratio: 13 (ref 12–28)
BUN: 12 mg/dL (ref 8–27)
CHLORIDE: 104 mmol/L (ref 96–106)
CO2: 25 mmol/L (ref 20–29)
CREATININE: 0.89 mg/dL (ref 0.57–1.00)
Calcium: 9.7 mg/dL (ref 8.7–10.3)
GFR calc Af Amer: 81 mL/min/{1.73_m2} (ref >59)
GFR calc non Af Amer: 71 mL/min/{1.73_m2} (ref >59)
GLUCOSE: 92 mg/dL (ref 65–99)
Globulin, Total: 2.6 g/dL (ref 1.5–4.5)
Potassium: 4.4 mmol/L (ref 3.5–5.2)
Sodium: 143 mmol/L (ref 134–144)
Total Protein: 6.9 g/dL (ref 6.0–8.5)

## 2017-12-30 LAB — LIPID PANEL
CHOLESTEROL TOTAL: 158 mg/dL (ref 100–199)
Chol/HDL Ratio: 3.7 ratio (ref 0.0–4.4)
HDL: 43 mg/dL (ref >39)
LDL CALC: 85 mg/dL (ref 0–99)
TRIGLYCERIDES: 151 mg/dL — AB (ref 0–149)
VLDL CHOLESTEROL CAL: 30 mg/dL (ref 5–40)

## 2017-12-30 MED ORDER — HYDROCHLOROTHIAZIDE 25 MG PO TABS: 25.0000 mg | ORAL_TABLET | Freq: Every day | ORAL | 1 refills | Status: DC

## 2017-12-30 NOTE — Progress Notes (Signed)
Chief Complaint  Patient presents with  . Follow-up    blood pressure/ update light duty    HPI   Hypertension: Patient here for follow-up of elevated blood pressure. She is exercising and is adherent to low salt diet.  Blood pressure is well controlled at home. Cardiac symptoms none. Patient denies chest pain, claudication, dyspnea, exertional chest pressure/discomfort, fatigue, irregular heart beat, lower extremity edema, near-syncope, palpitations, paroxysmal nocturnal dyspnea and syncope.  Cardiovascular risk factors: diabetes mellitus, dyslipidemia, hypertension and obesity (BMI >= 30 kg/m2). Use of agents associated with hypertension: none. History of target organ damage: none. BP Readings from Last 3 Encounters:  12/30/17 120/69  12/03/17 (!) 125/59  11/19/17 122/64   Diabetes Mellitus: Patient presents Diabetes Mellitus follow up.  Lab Results  Component Value Date   HGBA1C 6.5 (A) 11/19/2017   She saw Dr. Loanne Drilling for diabetes mgmt on 11/19/17 She reports that she gets some hypoglycemia about once a week and usually eats skittles.  She is now on Jardiance, trulicity, and metformin She exercises by walking Wt Readings from Last 3 Encounters:  12/30/17 201 lb 3.2 oz (91.3 kg)  11/19/17 200 lb (90.7 kg)  09/23/17 201 lb (91.2 kg)   Chronic shoulder pain - right Pt reports that she has been having continuation of her shoulder pain  She still has difficulty raising her hand over her head She states that she feels pain when you touch her shoulder and also difficulty with muscle tightness of the neck and shoulders   Past Medical History:  Diagnosis Date  . Anemia   . Bartholin gland cyst 06/04/2000  . Cyst   . Diabetes mellitus   . FH: colon cancer   . Fibroids   . Glaucoma   . H/O dysmenorrhea 06/04/2000  . H/O fatigue   . H/O menorrhagia 2001  . H/O: obesity   . Hypertension   . Infertility, female   . Menopausal symptoms   . Varicose vein   . Yeast infection       Current Outpatient Medications  Medication Sig Dispense Refill  . aspirin 81 MG tablet Take 81 mg by mouth daily.    Marland Kitchen BYSTOLIC 20 MG TABS Take 1 tablet (20 mg total) by mouth daily. 90 tablet 3  . carvedilol (COREG) 25 MG tablet Take 1 tablet (25 mg total) by mouth daily. 90 tablet 3  . Continuous Blood Gluc Receiver (FREESTYLE LIBRE READER) DEVI USE AS DIRECTED ONCE 6 Device 3  . Continuous Blood Gluc Sensor (FREESTYLE LIBRE SENSOR SYSTEM) MISC 1 Device by Does not apply route once a week. 4 each 11  . Dulaglutide (TRULICITY) 1.5 HT/3.4KA SOPN Trulicity 1.5 JG/8.1 mL subcutaneous pen injector    . empagliflozin (JARDIANCE) 25 MG TABS tablet Take 25 mg by mouth daily. 90 tablet 1  . estrogens, conjugated, (PREMARIN) 0.3 MG tablet Take 0.3 mg by mouth daily. Take daily for 21 days then do not take for 7 days.    . fluticasone furoate-vilanterol (BREO ELLIPTA) 200-25 MCG/INH AEPB Inhale 1 puff into the lungs daily. 90 each 1  . hydrochlorothiazide (HYDRODIURIL) 25 MG tablet Take 1 tablet (25 mg total) by mouth daily. 90 tablet 1  . Insulin Disposable Pump (V-GO 20) KIT 1 Device by Does not apply route daily. 30 kit 11  . insulin regular (HUMULIN R) 100 units/mL injection FOR USE IN PUMP, TOTAL OF 90 UNITS PER DAY 30 mL 11  . metFORMIN (GLUCOPHAGE) 1000 MG tablet TAKE 1 TABLET AT  BEDTIME 90 tablet 4  . metoprolol succinate (TOPROL-XL) 100 MG 24 hr tablet TAKE 1 TABLET DAILY WITH/OR IMMEDIATELY FOLLOWING A MEAL 90 tablet 1  . montelukast (SINGULAIR) 10 MG tablet TAKE 1 TABLET AT BEDTIME.  "OV NEEDED FOR ADDITIONAL REFILLS" 30 tablet 11  . Multiple Vitamin (MULTIVITAMIN) tablet Take 1 tablet by mouth daily.    Marland Kitchen NYAMYC powder     . nystatin-triamcinolone (MYCOLOG II) cream     . ONE TOUCH ULTRA TEST test strip USE TO CHECK SUGAR THREE TIMES A DAY AS INSTRUCTED 300 each 3  . OSPHENA 60 MG TABS     . PROCTOSOL HC 2.5 % rectal cream     . telmisartan (MICARDIS) 80 MG tablet TAKE 1 TABLET DAILY  90 tablet 0  . zolpidem (AMBIEN) 10 MG tablet Take 1 tablet (10 mg total) by mouth at bedtime as needed for sleep. 90 tablet 1  . HYDROcodone-acetaminophen (NORCO/VICODIN) 5-325 MG tablet hydrocodone 5 mg-acetaminophen 325 mg tablet    . ibuprofen (ADVIL,MOTRIN) 800 MG tablet ibuprofen 800 mg tablet    . medroxyPROGESTERone (PROVERA) 5 MG tablet Take 5 mg by mouth daily.    . Travoprost, BAK Free, (TRAVATAN) 0.004 % SOLN ophthalmic solution Place 1 drop into both eyes at bedtime.      No current facility-administered medications for this visit.     Allergies:  Allergies  Allergen Reactions  . Ace Inhibitors Cough    Past Surgical History:  Procedure Laterality Date  . [place] GLAND CYST EXCISION  2010  . CESAREAN SECTION  [id number]  . DILATION AND CURETTAGE OF UTERUS  1989  . TUBAL LIGATION  1996   bilateral    Social History   Socioeconomic History  . Marital status: Married    Spouse name: Madelaine Etienne  . Number of children: Not on file  . Years of education: Not on file  . Highest education level: Not on file  Occupational History  . Not on file  Social Needs  . Financial resource strain: Not on file  . Food insecurity:    Worry: Not on file    Inability: Not on file  . Transportation needs:    Medical: Not on file    Non-medical: Not on file  Tobacco Use  . Smoking status: Never Smoker  . Smokeless tobacco: Never Used  Substance and Sexual Activity  . Alcohol use: No    Alcohol/week: 0.0 standard drinks  . Drug use: No  . Sexual activity: Yes    Partners: Male    Comment: number of sex partners in the last 12 months 1  Lifestyle  . Physical activity:    Days per week: Not on file    Minutes per session: Not on file  . Stress: Not on file  Relationships  . Social connections:    Talks on phone: Not on file    Gets together: Not on file    Attends religious service: Not on file    Active member of club or organization: Not on file    Attends  meetings of clubs or organizations: Not on file    Relationship status: Not on file  Other Topics Concern  . Not on file  Social History Narrative   Exercise walk 2-3 times for 30-40 minutes    Family History  Problem Relation Age of Onset  . Cancer Father        colon  . Cancer Mother  per patient stomach  . Diabetes Maternal Grandmother   . Mental illness Sister   . Heart disease Brother      ROS Review of Systems See HPI Constitution: No fevers or chills No malaise No diaphoresis Skin: No rash or itching Eyes: no blurry vision, no double vision GU: no dysuria or hematuria Neuro: no dizziness or headaches all others reviewed and negative   Objective: Vitals:   12/30/17 0856  BP: 120/69  Pulse: 75  Resp: 17  Temp: 98.4 F (36.9 C)  TempSrc: Oral  SpO2: 99%  Weight: 201 lb 3.2 oz (91.3 kg)  Height: 5' 4.5" (1.638 m)    Physical Exam  Constitutional: She is oriented to person, place, and time. She appears well-developed and well-nourished.  HENT:  Head: Normocephalic and atraumatic.  Eyes: Conjunctivae and EOM are normal.  Cardiovascular: Normal rate, regular rhythm and normal heart sounds.  Pulmonary/Chest: Effort normal and breath sounds normal. No stridor. No respiratory distress. She has no wheezes.  Musculoskeletal:  Tenderness over the [facility] joint Tight muscles over the neck and shoulders  Neurological: She is alert and oriented to person, place, and time.  Skin: Skin is warm. Capillary refill takes less than 2 seconds.  Psychiatric: She has a normal mood and affect. Her behavior is normal. Judgment and thought content normal.    Assessment and Plan Laysa was seen today for follow-up.  Diagnoses and all orders for this visit:  Type 2 diabetes mellitus without complication, with long-term current use of insulin ([place])- diabetes at goal, cpm -     Comprehensive metabolic panel -     Lipid panel  Chronic right shoulder pain- continue with  PT, light duty letter given  Essential hypertension- bp in good range, cpm -     Comprehensive metabolic panel -     Lipid panel  Other orders -     hydrochlorothiazide (HYDRODIURIL) 25 MG tablet; Take 1 tablet (25 mg total) by mouth daily.     [place]

## 2017-12-30 NOTE — Patient Instructions (Signed)
° ° ° °  If you have lab work done today you will be contacted with your lab results within the next 2 weeks.  If you have not heard from us then please contact us. The fastest way to get your results is to register for My Chart. ° ° °IF you received an x-ray today, you will receive an invoice from [facility]. Please contact [facility] at [phone] with questions or concerns regarding your invoice.  ° °IF you received labwork today, you will receive an invoice from [facility]. Please contact [facility] at [phone] with questions or concerns regarding your invoice.  ° °Our billing staff will not be able to assist you with questions regarding bills from these companies. ° °You will be contacted with the lab results as soon as they are available. The fastest way to get your results is to activate your My Chart account. Instructions are located on the last page of this paperwork. If you have not heard from us regarding the results in 2 weeks, please contact this office. °  ° ° ° °

## 2017-12-31 ENCOUNTER — Ambulatory Visit (INDEPENDENT_AMBULATORY_CARE_PROVIDER_SITE_OTHER): Payer: 59 | Admitting: Sports Medicine

## 2017-12-31 ENCOUNTER — Encounter: Payer: Self-pay | Admitting: Sports Medicine

## 2017-12-31 DIAGNOSIS — M722 Plantar fascial fibromatosis: Secondary | ICD-10-CM | POA: Diagnosis not present

## 2017-12-31 DIAGNOSIS — M2141 Flat foot [pes planus] (acquired), right foot: Secondary | ICD-10-CM | POA: Diagnosis not present

## 2017-12-31 DIAGNOSIS — M79672 Pain in left foot: Secondary | ICD-10-CM

## 2017-12-31 DIAGNOSIS — Z794 Long term (current) use of insulin: Secondary | ICD-10-CM

## 2017-12-31 DIAGNOSIS — E119 Type 2 diabetes mellitus without complications: Secondary | ICD-10-CM | POA: Diagnosis not present

## 2017-12-31 DIAGNOSIS — M79671 Pain in right foot: Secondary | ICD-10-CM

## 2017-12-31 DIAGNOSIS — M2142 Flat foot [pes planus] (acquired), left foot: Secondary | ICD-10-CM

## 2017-12-31 NOTE — Progress Notes (Signed)
Subjective: Heidi Howell is a 62 y.o. female patient returns to office with complaint of heel pain on the left>right. Patient admits pain is better with shoes and brace however still has some soreness on the left heel.  Patient denies any warmth any redness swelling or any other constitutional symptoms at this time. Denies any other pedal complaints.   FBS 151 this morning    Patient Active Problem List   Diagnosis Date Noted  . Heel pain, bilateral 11/19/2017  . Chronic right shoulder pain 09/23/2017  . Long-term use of aspirin therapy 09/23/2017  . Type 2 diabetes mellitus without complication, with long-term current use of insulin ([place]) 07/14/2015  . Essential hypertension 09/23/2014  . Adverse drug effect 02/12/2014  . Cortical cataract 08/17/2013  . Primary open angle glaucoma 08/17/2013  . No diabetic retinopathy in both eyes 08/17/2013  . Hyperopia 08/17/2013  . Astigmatism 08/17/2013  . Presbyopia 08/17/2013  . Obesity 08/20/2011  . Symptomatic menopausal or female climacteric states 08/20/2011  . Insomnia 08/20/2011  . Fibroids 08/20/2011    Current Outpatient Medications on File Prior to Visit  Medication Sig Dispense Refill  . aspirin 81 MG tablet Take 81 mg by mouth daily.    Marland Kitchen BYSTOLIC 20 MG TABS Take 1 tablet (20 mg total) by mouth daily. 90 tablet 3  . carvedilol (COREG) 25 MG tablet Take 1 tablet (25 mg total) by mouth daily. 90 tablet 3  . Continuous Blood Gluc Receiver (FREESTYLE LIBRE READER) DEVI USE AS DIRECTED ONCE 6 Device 3  . Continuous Blood Gluc Sensor (FREESTYLE LIBRE SENSOR SYSTEM) MISC 1 Device by Does not apply route once a week. 4 each 11  . Dulaglutide (TRULICITY) 1.5 ZD/6.6YQ SOPN Trulicity 1.5 IH/4.7 mL subcutaneous pen injector    . empagliflozin (JARDIANCE) 25 MG TABS tablet Take 25 mg by mouth daily. 90 tablet 1  . estrogens, conjugated, (PREMARIN) 0.3 MG tablet Take 0.3 mg by mouth daily. Take daily for 21 days then do not take for 7 days.     . fluticasone furoate-vilanterol (BREO ELLIPTA) 200-25 MCG/INH AEPB Inhale 1 puff into the lungs daily. 90 each 1  . hydrochlorothiazide (HYDRODIURIL) 25 MG tablet Take 1 tablet (25 mg total) by mouth daily. 90 tablet 1  . HYDROcodone-acetaminophen (NORCO/VICODIN) 5-325 MG tablet hydrocodone 5 mg-acetaminophen 325 mg tablet    . ibuprofen (ADVIL,MOTRIN) 800 MG tablet ibuprofen 800 mg tablet    . Insulin Disposable Pump (V-GO 20) KIT 1 Device by Does not apply route daily. 30 kit 11  . insulin regular (HUMULIN R) 100 units/mL injection FOR USE IN PUMP, TOTAL OF 90 UNITS PER DAY 30 mL 11  . medroxyPROGESTERone (PROVERA) 5 MG tablet Take 5 mg by mouth daily.    . metFORMIN (GLUCOPHAGE) 1000 MG tablet TAKE 1 TABLET AT BEDTIME 90 tablet 4  . metoprolol succinate (TOPROL-XL) 100 MG 24 hr tablet TAKE 1 TABLET DAILY WITH/OR IMMEDIATELY FOLLOWING A MEAL 90 tablet 1  . montelukast (SINGULAIR) 10 MG tablet TAKE 1 TABLET AT BEDTIME.  "OV NEEDED FOR ADDITIONAL REFILLS" 30 tablet 11  . Multiple Vitamin (MULTIVITAMIN) tablet Take 1 tablet by mouth daily.    Marland Kitchen NYAMYC powder     . nystatin-triamcinolone (MYCOLOG II) cream     . ONE TOUCH ULTRA TEST test strip USE TO CHECK SUGAR THREE TIMES A DAY AS INSTRUCTED 300 each 3  . OSPHENA 60 MG TABS     . PROCTOSOL HC 2.5 % rectal cream     .  telmisartan (MICARDIS) 80 MG tablet TAKE 1 TABLET DAILY 90 tablet 0  . Travoprost, BAK Free, (TRAVATAN) 0.004 % SOLN ophthalmic solution Place 1 drop into both eyes at bedtime.     Marland Kitchen zolpidem (AMBIEN) 10 MG tablet Take 1 tablet (10 mg total) by mouth at bedtime as needed for sleep. 90 tablet 1   No current facility-administered medications on file prior to visit.     Allergies  Allergen Reactions  . Ace Inhibitors Cough    Objective: Physical Exam General: The patient is alert and oriented x3 in no acute distress.  Dermatology: Skin is warm, dry and supple bilateral lower extremities. Nails 1-10 are normal. There is  no erythema, edema, no eccymosis, no open lesions present. Integument is otherwise unremarkable.  Vascular: Dorsalis Pedis pulse and Posterior Tibial pulse are 2/4 bilateral. Capillary fill time is immediate to all digits.  Neurological: Grossly intact to light touch with an achilles reflex of +2/5 and a negative Tinel's sign bilateral. Protective sensation intact bilateral.  Musculoskeletal: Tenderness to palpation at the medial calcaneal tubercale and through the insertion of the plantar fascia on the left>right foot. No pain with compression of calcaneus bilateral. No pain with tuning fork to calcaneus bilateral. No pain with calf compression bilateral. There is decreased Ankle joint range of motion bilateral. All other joints range of motion within normal limits bilateral. + Bunion and pes planus. Strength 5/5 in all groups bilateral.   Assessment and Plan: Problem List Items Addressed This Visit      Endocrine   Type 2 diabetes mellitus without complication, with long-term current use of insulin ([place])     Other   Heel pain, bilateral    Other Visit Diagnoses    Plantar fasciitis, bilateral    -  Primary   Pes planus of both feet          -Complete examination performed.  -Re-Discussed with patient in detail the condition of plantar fasciitis, how this occurs and general treatment options. Explained both conservative and surgical treatments.  -Patient declined injection at this time -Recommended continue good supportive shoes and braces -Awaiting custom functional foot orthotics; patient was casted at today's visit done to call to discuss coverage and will plan to dispense orthotics if patient agrees with cost -Continue with daily stretching exercises. -Recommend patient to continue to ice affected area 1-2x daily. -Patient to return to office PUO or sooner if problems or questions arise.  Landis Martins, DPM

## 2017-12-31 NOTE — Telephone Encounter (Signed)
Patient needs script resent to  [place], [place]  Heidi Howell [address]  Phone: [phone] Fax: [phone]   because it was sent to [facility].

## 2018-01-01 ENCOUNTER — Other Ambulatory Visit: Payer: Self-pay | Admitting: *Deleted

## 2018-01-01 MED ORDER — HYDROCHLOROTHIAZIDE 25 MG PO TABS: 25.0000 mg | ORAL_TABLET | Freq: Every day | ORAL | 1 refills | Status: DC

## 2018-01-02 ENCOUNTER — Telehealth: Payer: Self-pay | Admitting: Family Medicine

## 2018-01-02 DIAGNOSIS — G4700 Insomnia, unspecified: Secondary | ICD-10-CM

## 2018-01-02 NOTE — Telephone Encounter (Signed)
Copied from [place] [id number]. Topic: Quick Communication - Rx Refill/Question >> Jan 02, 2018  4:53 PM Reyne Dumas L wrote: Medication: zolpidem (AMBIEN) 10 MG tablet  Has the patient contacted their pharmacy? Yes - states they haven't heard from [place] (Agent: If no, request that the patient contact the pharmacy for the refill.) (Agent: If yes, when and what did the pharmacy advise?)  Preferred Pharmacy (with phone number or street name): [place], [place] [phone] (Phone) [phone] (Fax)  Agent: Please be advised that RX refills may take up to 3 business days. We ask that you follow-up with your pharmacy.

## 2018-01-02 NOTE — Telephone Encounter (Signed)
Pt will need to have refill of Zolpidem (Ambien) 10 mg tab sent to [facility]. Prescription was written on 12/28/17 #90 with 1 refill and was sent to [facility] on [address].

## 2018-01-09 ENCOUNTER — Telehealth: Payer: Self-pay | Admitting: Sports Medicine

## 2018-01-09 NOTE — Telephone Encounter (Signed)
Please see note below and refill if possible  

## 2018-01-09 NOTE — Telephone Encounter (Signed)
Called pt with orthotic coverage and pt wants to proceed.

## 2018-01-10 MED ORDER — ZOLPIDEM TARTRATE 10 MG PO TABS: 10.0000 mg | ORAL_TABLET | Freq: Every evening | ORAL | 3 refills | Status: DC | PRN

## 2018-01-10 NOTE — Telephone Encounter (Signed)
Refill sent.

## 2018-01-21 ENCOUNTER — Ambulatory Visit (INDEPENDENT_AMBULATORY_CARE_PROVIDER_SITE_OTHER): Payer: 59 | Admitting: Orthotics

## 2018-01-21 ENCOUNTER — Encounter: Payer: 59 | Admitting: Orthotics

## 2018-01-21 DIAGNOSIS — M722 Plantar fascial fibromatosis: Secondary | ICD-10-CM

## 2018-01-21 DIAGNOSIS — M79672 Pain in left foot: Secondary | ICD-10-CM

## 2018-01-21 DIAGNOSIS — M2141 Flat foot [pes planus] (acquired), right foot: Secondary | ICD-10-CM

## 2018-01-21 DIAGNOSIS — M2142 Flat foot [pes planus] (acquired), left foot: Secondary | ICD-10-CM

## 2018-01-21 DIAGNOSIS — E119 Type 2 diabetes mellitus without complications: Secondary | ICD-10-CM

## 2018-01-21 DIAGNOSIS — M79671 Pain in right foot: Secondary | ICD-10-CM

## 2018-01-21 DIAGNOSIS — Z794 Long term (current) use of insulin: Secondary | ICD-10-CM

## 2018-01-21 NOTE — Progress Notes (Signed)
Patient came in today to pick up custom made foot orthotics.  The goals were accomplished and the patient reported no dissatisfaction with said orthotics.  Patient was advised of breakin period and how to report any issues. 

## 2018-02-11 ENCOUNTER — Other Ambulatory Visit: Payer: Self-pay | Admitting: Family Medicine

## 2018-02-11 NOTE — Telephone Encounter (Signed)
Pt would like 90 day supply with refills. Pt has met her deductible

## 2018-02-24 ENCOUNTER — Encounter: Payer: Self-pay | Admitting: Endocrinology

## 2018-02-24 ENCOUNTER — Ambulatory Visit (INDEPENDENT_AMBULATORY_CARE_PROVIDER_SITE_OTHER): Payer: 59 | Admitting: Endocrinology

## 2018-02-24 VITALS — BP 112/62 | HR 76 | Ht 64.5 in | Wt 195.0 lb

## 2018-02-24 DIAGNOSIS — E1165 Type 2 diabetes mellitus with hyperglycemia: Secondary | ICD-10-CM

## 2018-02-24 DIAGNOSIS — Z794 Long term (current) use of insulin: Secondary | ICD-10-CM

## 2018-02-24 LAB — POCT GLYCOSYLATED HEMOGLOBIN (HGB A1C): Hemoglobin A1C: 6.5 % — AB (ref 4.0–5.6)

## 2018-02-24 NOTE — Progress Notes (Signed)
Subjective:    Patient ID: Heidi Howell, female    DOB: 14-May-1955, 62 y.o.   MRN: [id number]  HPI Pt returns for f/u of diabetes mellitus: DM type: Insulin-requiring type 2. Dx'ed: 2007.  Complications: none.  Therapy: insulin since 2014, metformin, and jardiance.   GDM: never DKA: never Severe hypoglycemia: never. Pancreatitis: never.  Other: she is pursuing weight-loss surgery; she started  V-GO-20 pump in early 2017; she has "freestyle libre" device.   Interval history:   She takes these clicks: breakfast: 2, lunch: 1 and supper: 4 (1 click is 2 units).  no cbg record, but states cbg's vary from 93-208.  It is in general highest after breakfast.  pt states she feels well in general.  She does not have continuous glucose monitor reader with her today.   Past Medical History:  Diagnosis Date  . Anemia   . Bartholin gland cyst 06/04/2000  . Cyst   . Diabetes mellitus   . FH: colon cancer   . Fibroids   . Glaucoma   . H/O dysmenorrhea 06/04/2000  . H/O fatigue   . H/O menorrhagia 2001  . H/O: obesity   . Hypertension   . Infertility, female   . Menopausal symptoms   . Varicose vein   . Yeast infection     Past Surgical History:  Procedure Laterality Date  . [place] GLAND CYST EXCISION  2010  . CESAREAN SECTION  [id number]  . DILATION AND CURETTAGE OF UTERUS  1989  . TUBAL LIGATION  1996   bilateral    Social History   Socioeconomic History  . Marital status: Married    Spouse name: Madelaine Etienne  . Number of children: Not on file  . Years of education: Not on file  . Highest education level: Not on file  Occupational History  . Not on file  Social Needs  . Financial resource strain: Not on file  . Food insecurity:    Worry: Not on file    Inability: Not on file  . Transportation needs:    Medical: Not on file    Non-medical: Not on file  Tobacco Use  . Smoking status: Never Smoker  . Smokeless tobacco: Never Used  Substance and Sexual Activity  .  Alcohol use: No    Alcohol/week: 0.0 standard drinks  . Drug use: No  . Sexual activity: Yes    Partners: Male    Comment: number of sex partners in the last 12 months 1  Lifestyle  . Physical activity:    Days per week: Not on file    Minutes per session: Not on file  . Stress: Not on file  Relationships  . Social connections:    Talks on phone: Not on file    Gets together: Not on file    Attends religious service: Not on file    Active member of club or organization: Not on file    Attends meetings of clubs or organizations: Not on file    Relationship status: Not on file  . Intimate partner violence:    Fear of current or ex partner: Not on file    Emotionally abused: Not on file    Physically abused: Not on file    Forced sexual activity: Not on file  Other Topics Concern  . Not on file  Social History Narrative   Exercise walk 2-3 times for 30-40 minutes    Current Outpatient Medications on File Prior to Visit  Medication  Sig Dispense Refill  . aspirin 81 MG tablet Take 81 mg by mouth daily.    Marland Kitchen BYSTOLIC 20 MG TABS Take 1 tablet (20 mg total) by mouth daily. 90 tablet 3  . carvedilol (COREG) 25 MG tablet Take 1 tablet (25 mg total) by mouth daily. 90 tablet 3  . Continuous Blood Gluc Receiver (FREESTYLE LIBRE READER) DEVI USE AS DIRECTED ONCE 6 Device 3  . Continuous Blood Gluc Sensor (FREESTYLE LIBRE SENSOR SYSTEM) MISC 1 Device by Does not apply route once a week. 4 each 11  . Dulaglutide (TRULICITY) 1.5 LA/4.5XM SOPN Trulicity 1.5 IW/8.0 mL subcutaneous pen injector    . estrogens, conjugated, (PREMARIN) 0.3 MG tablet Take 0.3 mg by mouth daily. Take daily for 21 days then do not take for 7 days.    . fluticasone furoate-vilanterol (BREO ELLIPTA) 200-25 MCG/INH AEPB Inhale 1 puff into the lungs daily. 90 each 1  . hydrochlorothiazide (HYDRODIURIL) 25 MG tablet Take 1 tablet (25 mg total) by mouth daily. 90 tablet 1  . HYDROcodone-acetaminophen (NORCO/VICODIN) 5-325  MG tablet hydrocodone 5 mg-acetaminophen 325 mg tablet    . ibuprofen (ADVIL,MOTRIN) 800 MG tablet ibuprofen 800 mg tablet    . Insulin Disposable Pump (V-GO 20) KIT 1 Device by Does not apply route daily. 30 kit 11  . insulin regular (HUMULIN R) 100 units/mL injection FOR USE IN PUMP, TOTAL OF 90 UNITS PER DAY 30 mL 11  . JARDIANCE 25 MG TABS tablet TAKE 1 TABLET DAILY 90 tablet 1  . medroxyPROGESTERone (PROVERA) 5 MG tablet Take 5 mg by mouth daily.    . metFORMIN (GLUCOPHAGE) 1000 MG tablet TAKE 1 TABLET AT BEDTIME 90 tablet 4  . metoprolol succinate (TOPROL-XL) 100 MG 24 hr tablet TAKE 1 TABLET DAILY WITH/OR IMMEDIATELY FOLLOWING A MEAL 90 tablet 1  . montelukast (SINGULAIR) 10 MG tablet TAKE 1 TABLET AT BEDTIME.  "OV NEEDED FOR ADDITIONAL REFILLS" 30 tablet 11  . Multiple Vitamin (MULTIVITAMIN) tablet Take 1 tablet by mouth daily.    Marland Kitchen NYAMYC powder     . nystatin-triamcinolone (MYCOLOG II) cream     . ONE TOUCH ULTRA TEST test strip USE TO CHECK SUGAR THREE TIMES A DAY AS INSTRUCTED 300 each 3  . OSPHENA 60 MG TABS     . PROCTOSOL HC 2.5 % rectal cream     . telmisartan (MICARDIS) 80 MG tablet TAKE 1 TABLET DAILY 90 tablet 0  . zolpidem (AMBIEN) 10 MG tablet Take 1 tablet (10 mg total) by mouth at bedtime as needed for sleep. 90 tablet 3   No current facility-administered medications on file prior to visit.     Allergies  Allergen Reactions  . Ace Inhibitors Cough    Family History  Problem Relation Age of Onset  . Cancer Father        colon  . Cancer Mother        per patient stomach  . Diabetes Maternal Grandmother   . Mental illness Sister   . Heart disease Brother     BP 112/62 (BP Location: Right Arm, Patient Position: Sitting, Cuff Size: Normal)   Pulse 76   Ht 5' 4.5" (1.638 m)   Wt 195 lb (88.5 kg)   LMP 01/19/2011   SpO2 94%   BMI 32.95 kg/m    Review of Systems She denies hypoglycemia    Objective:   Physical Exam VITAL SIGNS:  See vs page GENERAL:  no distress Pulses: foot pulses are intact  bilaterally.   MSK: no deformity of the feet or ankles.  CV: no edema of the legs or ankles Skin:  no ulcer on the feet or ankles.  normal color and temp on the feet and ankles Neuro: sensation is intact to touch on the feet and ankles.    Lab Results  Component Value Date   HGBA1C 6.5 (A) 02/24/2018       Assessment & Plan:  Insulin-requiring type 2 DM: well-controlled Obesity, improved. Please continue the same trulicity  Patient Instructions  check your blood sugar twice a day.  vary the time of day when you check, between before the 3 meals, and at bedtime.  also check if you have symptoms of your blood sugar being too high or too low.  please keep a record of the readings and bring it to your next appointment here.  You can write it on any piece of paper.  please call us sooner if your blood sugar goes below 70, or if you have a lot of readings over 200.   Please come back for a follow-up appointment in 4 months.  Please continue the same Trulicity.  Please continue the same clicks: breakfast:2 lunch:1 and supper:4 clicks.

## 2018-02-24 NOTE — Patient Instructions (Addendum)
check your blood sugar twice a day.  vary the time of day when you check, between before the 3 meals, and at bedtime.  also check if you have symptoms of your blood sugar being too high or too low.  please keep a record of the readings and bring it to your next appointment here.  You can write it on any piece of paper.  please call us sooner if your blood sugar goes below 70, or if you have a lot of readings over 200.   Please come back for a follow-up appointment in 4 months.  Please continue the same Trulicity.  Please continue the same clicks: breakfast:2 lunch:1 and supper:4 clicks.

## 2018-03-14 ENCOUNTER — Other Ambulatory Visit: Payer: Self-pay

## 2018-03-14 ENCOUNTER — Other Ambulatory Visit: Payer: Self-pay | Admitting: Family Medicine

## 2018-03-14 MED ORDER — GLUCOSE BLOOD VI STRP: ORAL_STRIP | 3 refills | Status: DC

## 2018-03-14 NOTE — Telephone Encounter (Signed)
Requested Prescriptions  Pending Prescriptions Disp Refills  . telmisartan (MICARDIS) 80 MG tablet [Pharmacy Med Name: TELMISARTAN TABS 1'S 80MG ] 90 tablet 0    Sig: TAKE 1 TABLET DAILY     Cardiovascular:  Angiotensin Receptor Blockers Passed - 03/14/2018  9:28 AM      Passed - Cr in normal range and within 180 days    Creat  Date Value Ref Range Status  09/23/2014 0.62 0.50 - 1.10 mg/dL Final   Creatinine, Ser  Date Value Ref Range Status  12/30/2017 0.89 0.57 - 1.00 mg/dL Final         Passed - K in normal range and within 180 days    Potassium  Date Value Ref Range Status  12/30/2017 4.4 3.5 - 5.2 mmol/L Final         Passed - Patient is not pregnant      Passed - Last BP in normal range    BP Readings from Last 1 Encounters:  02/24/18 112/62         Passed - Valid encounter within last 6 months    Recent Outpatient Visits          2 months ago Type 2 diabetes mellitus without complication, with long-term current use of insulin ([place])   Primary Care at [facility], Zoe A, MD   5 months ago Chronic right shoulder pain   Primary Care at [place], MD   9 months ago Essential hypertension   Primary Care at [place], MD   11 months ago Encounter for health maintenance examination in adult   Primary Care at [place], MD   1 year ago Type 2 diabetes mellitus with hyperglycemia, with long-term current use of insulin [facility])   Primary Care at [place], MD      Future Appointments            In 1 month Forrest Moron, MD Primary Care at [place], [facility]

## 2018-03-14 NOTE — Telephone Encounter (Signed)
Received refill request from [facility] for One Touch test strips. Rx sent as ordered and requested.

## 2018-04-28 ENCOUNTER — Other Ambulatory Visit: Payer: Self-pay

## 2018-04-28 ENCOUNTER — Ambulatory Visit (INDEPENDENT_AMBULATORY_CARE_PROVIDER_SITE_OTHER): Payer: 59 | Admitting: Family Medicine

## 2018-04-28 ENCOUNTER — Encounter: Payer: Self-pay | Admitting: Family Medicine

## 2018-04-28 VITALS — BP 120/73 | HR 71 | Temp 98.6°F | Resp 16 | Ht 64.5 in | Wt 197.4 lb

## 2018-04-28 DIAGNOSIS — E1165 Type 2 diabetes mellitus with hyperglycemia: Secondary | ICD-10-CM

## 2018-04-28 DIAGNOSIS — Z794 Long term (current) use of insulin: Secondary | ICD-10-CM

## 2018-04-28 DIAGNOSIS — G8929 Other chronic pain: Secondary | ICD-10-CM

## 2018-04-28 DIAGNOSIS — I10 Essential (primary) hypertension: Secondary | ICD-10-CM | POA: Diagnosis not present

## 2018-04-28 DIAGNOSIS — M25511 Pain in right shoulder: Secondary | ICD-10-CM | POA: Diagnosis not present

## 2018-04-28 DIAGNOSIS — M5442 Lumbago with sciatica, left side: Secondary | ICD-10-CM

## 2018-04-28 DIAGNOSIS — Z23 Encounter for immunization: Secondary | ICD-10-CM

## 2018-04-28 DIAGNOSIS — E119 Type 2 diabetes mellitus without complications: Secondary | ICD-10-CM | POA: Diagnosis not present

## 2018-04-28 DIAGNOSIS — M5441 Lumbago with sciatica, right side: Secondary | ICD-10-CM

## 2018-04-28 MED ORDER — BYSTOLIC 20 MG PO TABS: 10.0000 mg | ORAL_TABLET | Freq: Every day | ORAL | 3 refills | Status: DC

## 2018-04-28 NOTE — Progress Notes (Signed)
Established Patient Office Visit  Subjective:  Patient ID: Heidi Howell, female    DOB: 11-01-1955  Age: 62 y.o. MRN: [id number]  CC:  Chief Complaint  Patient presents with  . Diabetes    f/u.  Pt needs light duty note updated    HPI HATLEY HENEGAR presents for  Health Maintenance  Screening for colon cancer  Pt saw Dr. Collene Mares at [facility] and had colonoscopy 02/2018 Results were normal and she was told 18yrfollow up    Diabetes She saw Endocrinology, Dr. ELoanne Drilling She was advised to continue Trulicity at her current dose She denies hypoglycemia She is monitoring her blood glucose ranges 110-114 Lab Results  Component Value Date   HGBA1C 6.5 (A) 02/24/2018    Body mass index is 33.36 kg/m.  Wt Readings from Last 3 Encounters:  04/28/18 197 lb 6.4 oz (89.5 kg)  02/24/18 195 lb (88.5 kg)  12/30/17 201 lb 3.2 oz (91.3 kg)    Hypertension: Patient here for follow-up of elevated blood pressure. She is exercising and is adherent to low salt diet.  Blood pressure is well controlled at home. Cardiac symptoms none. Patient denies chest pain, chest pressure/discomfort, claudication, dyspnea, exertional chest pressure/discomfort, fatigue, irregular heart beat and lower extremity edema.  Cardiovascular risk factors: diabetes mellitus, dyslipidemia, hypertension, obesity (BMI >= 30 kg/m2) and sedentary lifestyle. Use of agents associated with hypertension: none. History of target organ damage: none. BP Readings from Last 3 Encounters:  04/28/18 120/73  02/24/18 112/62  12/30/17 120/69   Musculoskeletal issues She is still on light duty at work due to her chronic shoulder and back pains. Even with the light duty she has to take breaks because of the shoulder. She reports that it is aggravated by repetitive movements. She denies joint swelling. She still has normal grasp strength.   Past Medical History:  Diagnosis Date  . Anemia   . Bartholin gland cyst 06/04/2000  .  Cyst   . Diabetes mellitus   . FH: colon cancer   . Fibroids   . Glaucoma   . H/O dysmenorrhea 06/04/2000  . H/O fatigue   . H/O menorrhagia 2001  . H/O: obesity   . Hypertension   . Infertility, female   . Menopausal symptoms   . Varicose vein   . Yeast infection     Past Surgical History:  Procedure Laterality Date  . [place] EXCISION  2010  . CESAREAN SECTION  [id number] . DILATION AND CURETTAGE OF UTERUS  1989  . TUBAL LIGATION  1996   bilateral    Family History  Problem Relation Age of Onset  . Cancer Father        colon  . Cancer Mother        per patient stomach  . Diabetes Maternal Grandmother   . Mental illness Sister   . Heart disease Brother     Social History   Socioeconomic History  . Marital status: Married    Spouse name: WMadelaine Etienne . Number of children: Not on file  . Years of education: Not on file  . Highest education level: Not on file  Occupational History  . Not on file  Social Needs  . Financial resource strain: Not on file  . Food insecurity:    Worry: Not on file    Inability: Not on file  . Transportation needs:    Medical: Not on file    Non-medical: Not on file  Tobacco Use  . Smoking status: Never Smoker  . Smokeless tobacco: Never Used  Substance and Sexual Activity  . Alcohol use: No    Alcohol/week: 0.0 standard drinks  . Drug use: No  . Sexual activity: Yes    Partners: Male    Comment: number of sex partners in the last 12 months 1  Lifestyle  . Physical activity:    Days per week: Not on file    Minutes per session: Not on file  . Stress: Not on file  Relationships  . Social connections:    Talks on phone: Not on file    Gets together: Not on file    Attends religious service: Not on file    Active member of club or organization: Not on file    Attends meetings of clubs or organizations: Not on file    Relationship status: Not on file  . Intimate partner violence:    Fear of current or ex  partner: Not on file    Emotionally abused: Not on file    Physically abused: Not on file    Forced sexual activity: Not on file  Other Topics Concern  . Not on file  Social History Narrative   Exercise walk 2-3 times for 30-40 minutes    Outpatient Medications Prior to Visit  Medication Sig Dispense Refill  . aspirin 81 MG tablet Take 81 mg by mouth daily.    . carvedilol (COREG) 25 MG tablet Take 1 tablet (25 mg total) by mouth daily. 90 tablet 3  . Continuous Blood Gluc Receiver (FREESTYLE LIBRE READER) DEVI USE AS DIRECTED ONCE 6 Device 3  . Continuous Blood Gluc Sensor (FREESTYLE LIBRE SENSOR SYSTEM) MISC 1 Device by Does not apply route once a week. 4 each 11  . Dulaglutide (TRULICITY) 1.5 MG/0.5ML SOPN Trulicity 1.5 mg/0.5 mL subcutaneous pen injector    . estrogens, conjugated, (PREMARIN) 0.3 MG tablet Take 0.3 mg by mouth daily. Take daily for 21 days then do not take for 7 days.    . fluticasone furoate-vilanterol (BREO ELLIPTA) 200-25 MCG/INH AEPB Inhale 1 puff into the lungs daily. 90 each 1  . glucose blood (ONE TOUCH ULTRA TEST) test strip Use to monitor your glucose BID 300 each 3  . hydrochlorothiazide (HYDRODIURIL) 25 MG tablet Take 1 tablet (25 mg total) by mouth daily. 90 tablet 1  . ibuprofen (ADVIL,MOTRIN) 800 MG tablet ibuprofen 800 mg tablet    . Insulin Disposable Pump (V-GO 20) KIT 1 Device by Does not apply route daily. 30 kit 11  . insulin regular (HUMULIN R) 100 units/mL injection FOR USE IN PUMP, TOTAL OF 90 UNITS PER DAY 30 mL 11  . JARDIANCE 25 MG TABS tablet TAKE 1 TABLET DAILY 90 tablet 1  . medroxyPROGESTERone (PROVERA) 5 MG tablet Take 5 mg by mouth daily.    . metFORMIN (GLUCOPHAGE) 1000 MG tablet TAKE 1 TABLET AT BEDTIME 90 tablet 4  . metoprolol succinate (TOPROL-XL) 100 MG 24 hr tablet TAKE 1 TABLET DAILY WITH/OR IMMEDIATELY FOLLOWING A MEAL 90 tablet 1  . montelukast (SINGULAIR) 10 MG tablet TAKE 1 TABLET AT BEDTIME.  "OV NEEDED FOR ADDITIONAL  REFILLS" 30 tablet 11  . Multiple Vitamin (MULTIVITAMIN) tablet Take 1 tablet by mouth daily.    . NYAMYC powder     . nystatin-triamcinolone (MYCOLOG II) cream     . OSPHENA 60 MG TABS     . PROCTOSOL HC 2.5 % rectal cream     .   telmisartan (MICARDIS) 80 MG tablet TAKE 1 TABLET DAILY 90 tablet 0  . zolpidem (AMBIEN) 10 MG tablet Take 1 tablet (10 mg total) by mouth at bedtime as needed for sleep. 90 tablet 3  . BYSTOLIC 20 MG TABS Take 1 tablet (20 mg total) by mouth daily. 90 tablet 3  . HYDROcodone-acetaminophen (NORCO/VICODIN) 5-325 MG tablet hydrocodone 5 mg-acetaminophen 325 mg tablet     No facility-administered medications prior to visit.     Allergies  Allergen Reactions  . Ace Inhibitors Cough    ROS Review of Systems Review of Systems  Constitutional: Negative for activity change, appetite change, chills and fever.  HENT: Negative for congestion, nosebleeds, trouble swallowing and voice change.   Respiratory: Negative for cough, shortness of breath and wheezing.   Gastrointestinal: Negative for diarrhea, nausea and vomiting.  Genitourinary: Negative for difficulty urinating, dysuria, flank pain and hematuria.  Musculoskeletal: see hpi Neurological: Negative for dizziness, speech difficulty, light-headedness and numbness.  See HPI. All other review of systems negative.     Objective:    Physical Exam  BP 120/73 (BP Location: Right Arm, Patient Position: Sitting, Cuff Size: Normal)   Pulse 71   Temp 98.6 F (37 C) (Oral)   Resp 16   Ht 5' 4.5" (1.638 m)   Wt 197 lb 6.4 oz (89.5 kg)   LMP 01/19/2011   SpO2 99%   BMI 33.36 kg/m  Wt Readings from Last 3 Encounters:  04/28/18 197 lb 6.4 oz (89.5 kg)  02/24/18 195 lb (88.5 kg)  12/30/17 201 lb 3.2 oz (91.3 kg)   Physical Exam  Constitutional: Oriented to person, place, and time. Appears well-developed and well-nourished.  HENT:  Head: Normocephalic and atraumatic.  Eyes: Conjunctivae and EOM are normal.    Cardiovascular: Normal rate, regular rhythm, normal heart sounds and intact distal pulses.  No murmur heard. Pulmonary/Chest: Effort normal and breath sounds normal. No stridor. No respiratory distress. Has no wheezes.  Neurological: Is alert and oriented to person, place, and time.  Skin: Skin is warm. Capillary refill takes less than 2 seconds.  Psychiatric: Has a normal mood and affect. Behavior is normal. Judgment and thought content normal.    Health Maintenance Due  Topic Date Due  . HIV Screening  07/19/1972  . OPHTHALMOLOGY EXAM  07/12/2017  . COLONOSCOPY  07/19/2017    There are no preventive care reminders to display for this patient.  Lab Results  Component Value Date   TSH 1.170 11/20/2016   Lab Results  Component Value Date   WBC 8.8 11/20/2016   HGB 11.6 11/20/2016   HCT 36.3 11/20/2016   MCV 82 11/20/2016   PLT 279 11/20/2016   Lab Results  Component Value Date   NA 143 12/30/2017   K 4.4 12/30/2017   CO2 25 12/30/2017   GLUCOSE 92 12/30/2017   BUN 12 12/30/2017   CREATININE 0.89 12/30/2017   BILITOT 0.5 12/30/2017   ALKPHOS 88 12/30/2017   AST 17 12/30/2017   ALT 21 12/30/2017   PROT 6.9 12/30/2017   ALBUMIN 4.3 12/30/2017   CALCIUM 9.7 12/30/2017   ANIONGAP 18 (H) 04/15/2014   Lab Results  Component Value Date   CHOL 158 12/30/2017   Lab Results  Component Value Date   HDL 43 12/30/2017   Lab Results  Component Value Date   LDLCALC 85 12/30/2017   Lab Results  Component Value Date   TRIG 151 (H) 12/30/2017   Lab Results  Component Value   Date   CHOLHDL 3.7 12/30/2017   Lab Results  Component Value Date   HGBA1C 6.5 (A) 02/24/2018      Assessment & Plan:   Problem List Items Addressed This Visit      Cardiovascular and Mediastinum   Essential hypertension - Primary    Her blood pressure is at goal. Decrease her bystolic by a half. Patient is motivated to decrease the amount of medications she is taking. If her bp increases  above 130/80 will increase her bp meds back to bystolic 01VC      Relevant Medications   BYSTOLIC 20 MG TABS     Endocrine   Type 2 diabetes mellitus without complication, with long-term current use of insulin (HCC)    Reviewed Dr. Cordelia Pen notes and recommendations with patient.  Her diabetes is well controlled and her health screenings are up to date.         Other   Chronic right shoulder pain    Renewed work restrictions for light duty for 3 months.        Other Visit Diagnoses    Need for prophylactic vaccination and inoculation against influenza       Chronic midline low back pain with bilateral sciatica       Type 2 diabetes mellitus with hyperglycemia, with long-term current use of insulin ([place])          Meds ordered this encounter  Medications  . BYSTOLIC 20 MG TABS    Sig: Take 0.5 tablets (10 mg total) by mouth daily.    Dispense:  90 tablet    Refill:  3    Follow-up: Return in about 3 months (around 07/28/2018).    Forrest Moron, MD

## 2018-04-28 NOTE — Patient Instructions (Signed)
° ° ° °  If you have lab work done today you will be contacted with your lab results within the next 2 weeks.  If you have not heard from us then please contact us. The fastest way to get your results is to register for My Chart. ° ° °IF you received an x-ray today, you will receive an invoice from [facility]. Please contact [facility] at [phone] with questions or concerns regarding your invoice.  ° °IF you received labwork today, you will receive an invoice from [facility]. Please contact [facility] at [phone] with questions or concerns regarding your invoice.  ° °Our billing staff will not be able to assist you with questions regarding bills from these companies. ° °You will be contacted with the lab results as soon as they are available. The fastest way to get your results is to activate your My Chart account. Instructions are located on the last page of this paperwork. If you have not heard from us regarding the results in 2 weeks, please contact this office. °  ° ° ° °

## 2018-04-29 NOTE — Assessment & Plan Note (Signed)
Renewed work restrictions for light duty for 3 months.

## 2018-04-29 NOTE — Assessment & Plan Note (Signed)
Reviewed Dr. Cordelia Pen notes and recommendations with patient.  Her diabetes is well controlled and her health screenings are up to date.

## 2018-04-29 NOTE — Assessment & Plan Note (Signed)
Her blood pressure is at goal. Decrease her bystolic by a half. Patient is motivated to decrease the amount of medications she is taking. If her bp increases above 130/80 will increase her bp meds back to bystolic 20mg 

## 2018-05-19 ENCOUNTER — Other Ambulatory Visit: Payer: Self-pay | Admitting: Family Medicine

## 2018-06-03 ENCOUNTER — Telehealth: Payer: Self-pay | Admitting: Family Medicine

## 2018-06-03 NOTE — Telephone Encounter (Signed)
Called and spoke with pt regarding their appt scheduled with Dr. Nolon Rod on 07/21/18. Due to Dr. Nolon Rod being out of the office, pt was needing to be rescheduled. I was able to get pt rescheduled to 07/30/18 at 8:20. I advised of time, building number and late policy. Pt acknowledged.

## 2018-06-23 ENCOUNTER — Other Ambulatory Visit: Payer: Self-pay | Admitting: Family Medicine

## 2018-06-23 MED ORDER — BYSTOLIC 20 MG PO TABS: 10.0000 mg | ORAL_TABLET | Freq: Every day | ORAL | 1 refills | Status: DC

## 2018-06-23 NOTE — Telephone Encounter (Signed)
Patient called and asked about the Amlodipine. I advised it is no longer on her profile. She says she takes that along with Bystolic and Carvedilol. She asked for a refill of Bystolic, because the pharmacy didn't receive the one sent in December.

## 2018-06-23 NOTE — Telephone Encounter (Signed)
Requested medication (s) are due for refill today: Yes  Requested medication (s) are on the active medication list: Yes  Last refill:  Carvedilol 05/17/17; Amlodipine not on med list and was removed on 06/12/17  Future visit scheduled: Yes  Notes to clinic:  Unable to refill, expired Rx; patient needs Amlodipine refilled, says she takes it.     Requested Prescriptions  Pending Prescriptions Disp Refills   carvedilol (COREG) 25 MG tablet [Pharmacy Med Name: CARVEDILOL (IR) TABS 25MG ] 90 tablet 3    Sig: TAKE 1 TABLET DAILY     Cardiovascular:  Beta Blockers Passed - 06/23/2018  1:33 PM      Passed - Last BP in normal range    BP Readings from Last 1 Encounters:  04/28/18 120/73         Passed - Last Heart Rate in normal range    Pulse Readings from Last 1 Encounters:  04/28/18 71         Passed - Valid encounter within last 6 months    Recent Outpatient Visits          1 month ago Essential hypertension   Primary Care at [place], MD   5 months ago Type 2 diabetes mellitus without complication, with long-term current use of insulin ([place])   Primary Care at [facility], Zoe A, MD   9 months ago Chronic right shoulder pain   Primary Care at [facility], Arlie Solomons, MD   1 year ago Essential hypertension   Primary Care at [facility], Arlie Solomons, MD   1 year ago Encounter for health maintenance examination in adult   Primary Care at [place], MD      Future Appointments            In 1 month Forrest Moron, MD Primary Care at [place], [place]          amLODipine ([place]) 5 MG tablet [Pharmacy Med Name: AMLODIPINE BESYLATE TABS 5MG ] 90 tablet 4    Sig: TAKE 1 TABLET DAILY     Cardiovascular:  Calcium Channel Blockers Passed - 06/23/2018  1:33 PM      Passed - Last BP in normal range    BP Readings from Last 1 Encounters:  04/28/18 120/73         Passed - Valid encounter within last 6 months    Recent Outpatient Visits          1  month ago Essential hypertension   Primary Care at [place], MD   5 months ago Type 2 diabetes mellitus without complication, with long-term current use of insulin ([place])   Primary Care at [facility], Zoe A, MD   9 months ago Chronic right shoulder pain   Primary Care at [facility], Arlie Solomons, MD   1 year ago Essential hypertension   Primary Care at [facility], Arlie Solomons, MD   1 year ago Encounter for health maintenance examination in adult   Primary Care at [place], MD      Future Appointments            In 1 month Forrest Moron, MD Primary Care at [place], [facility]         Signed Prescriptions Disp Refills   BYSTOLIC 20 MG TABS 45 tablet 1    Sig: Take 0.5 tablets (10 mg total) by mouth daily.     Cardiovascular:  Beta Blockers Passed -  06/23/2018  1:33 PM      Passed - Last BP in normal range    BP Readings from Last 1 Encounters:  04/28/18 120/73         Passed - Last Heart Rate in normal range    Pulse Readings from Last 1 Encounters:  04/28/18 71         Passed - Valid encounter within last 6 months    Recent Outpatient Visits          1 month ago Essential hypertension   Primary Care at [place], MD   5 months ago Type 2 diabetes mellitus without complication, with long-term current use of insulin ([place])   Primary Care at [facility], Zoe A, MD   9 months ago Chronic right shoulder pain   Primary Care at [facility], Arlie Solomons, MD   1 year ago Essential hypertension   Primary Care at [facility], Arlie Solomons, MD   1 year ago Encounter for health maintenance examination in adult   Primary Care at [place], MD      Future Appointments            In 1 month Forrest Moron, MD Primary Care at [place], [facility]

## 2018-06-30 ENCOUNTER — Ambulatory Visit: Payer: Self-pay | Admitting: Endocrinology

## 2018-07-03 ENCOUNTER — Encounter: Payer: Self-pay | Admitting: Endocrinology

## 2018-07-03 ENCOUNTER — Other Ambulatory Visit: Payer: Self-pay

## 2018-07-03 ENCOUNTER — Ambulatory Visit (INDEPENDENT_AMBULATORY_CARE_PROVIDER_SITE_OTHER): Payer: 59 | Admitting: Endocrinology

## 2018-07-03 VITALS — BP 104/60 | HR 66 | Ht 64.5 in | Wt 195.8 lb

## 2018-07-03 DIAGNOSIS — Z794 Long term (current) use of insulin: Secondary | ICD-10-CM | POA: Diagnosis not present

## 2018-07-03 DIAGNOSIS — E1165 Type 2 diabetes mellitus with hyperglycemia: Secondary | ICD-10-CM

## 2018-07-03 LAB — POCT GLYCOSYLATED HEMOGLOBIN (HGB A1C): Hemoglobin A1C: 6.3 % — AB (ref 4.0–5.6)

## 2018-07-03 MED ORDER — INSULIN REGULAR HUMAN 100 UNIT/ML IJ SOLN: INTRAMUSCULAR | 11 refills | Status: DC

## 2018-07-03 NOTE — Progress Notes (Signed)
Subjective:    Patient ID: Heidi Howell, female    DOB: 1955-09-18, 63 y.o.   MRN: [id number]  HPI Pt returns for f/u of diabetes mellitus: DM type: Insulin-requiring type 2. Dx'ed: 2007.  Complications: none.  Therapy: insulin since 2014, metformin, and jardiance.   GDM: never DKA: never Severe hypoglycemia: never.  Pancreatitis: never.  Other: she is pursuing weight-loss surgery; she started  V-GO-20 pump in early 2017; she has "freestyle libre" device.   Interval history:   She takes these clicks: breakfast: 2, lunch: 1 and supper: 4 (1 click is 2 units).  no cbg record, but states cbg's vary from 76-200's.  It is in general lowest at lunch.  pt states she feels well in general.  She does not have continuous glucose monitor reader with her today.   Past Medical History:  Diagnosis Date  . Anemia   . Bartholin gland cyst 06/04/2000  . Cyst   . Diabetes mellitus   . FH: colon cancer   . Fibroids   . Glaucoma   . H/O dysmenorrhea 06/04/2000  . H/O fatigue   . H/O menorrhagia 2001  . H/O: obesity   . Hypertension   . Infertility, female   . Menopausal symptoms   . Varicose vein   . Yeast infection     Past Surgical History:  Procedure Laterality Date  . [place] GLAND CYST EXCISION  2010  . CESAREAN SECTION  [id number]  . DILATION AND CURETTAGE OF UTERUS  1989  . TUBAL LIGATION  1996   bilateral    Social History   Socioeconomic History  . Marital status: Married    Spouse name: Madelaine Etienne  . Number of children: Not on file  . Years of education: Not on file  . Highest education level: Not on file  Occupational History  . Not on file  Social Needs  . Financial resource strain: Not on file  . Food insecurity:    Worry: Not on file    Inability: Not on file  . Transportation needs:    Medical: Not on file    Non-medical: Not on file  Tobacco Use  . Smoking status: Never Smoker  . Smokeless tobacco: Never Used  Substance and Sexual Activity  .  Alcohol use: No    Alcohol/week: 0.0 standard drinks  . Drug use: No  . Sexual activity: Yes    Partners: Male    Comment: number of sex partners in the last 12 months 1  Lifestyle  . Physical activity:    Days per week: Not on file    Minutes per session: Not on file  . Stress: Not on file  Relationships  . Social connections:    Talks on phone: Not on file    Gets together: Not on file    Attends religious service: Not on file    Active member of club or organization: Not on file    Attends meetings of clubs or organizations: Not on file    Relationship status: Not on file  . Intimate partner violence:    Fear of current or ex partner: Not on file    Emotionally abused: Not on file    Physically abused: Not on file    Forced sexual activity: Not on file  Other Topics Concern  . Not on file  Social History Narrative   Exercise walk 2-3 times for 30-40 minutes    Current Outpatient Medications on File Prior to Visit  Medication Sig Dispense Refill  . aspirin 81 MG tablet Take 81 mg by mouth daily.    Marland Kitchen BYSTOLIC 20 MG TABS Take 0.5 tablets (10 mg total) by mouth daily. 45 tablet 1  . carvedilol (COREG) 25 MG tablet Take 1 tablet (25 mg total) by mouth daily. 90 tablet 3  . Continuous Blood Gluc Receiver (FREESTYLE LIBRE READER) DEVI USE AS DIRECTED ONCE 6 Device 3  . Continuous Blood Gluc Sensor (FREESTYLE LIBRE SENSOR SYSTEM) MISC 1 Device by Does not apply route once a week. 4 each 11  . Dulaglutide (TRULICITY) 1.5 VQ/2.5ZD SOPN Trulicity 1.5 GL/8.7 mL subcutaneous pen injector    . estrogens, conjugated, (PREMARIN) 0.3 MG tablet Take 0.3 mg by mouth daily. Take daily for 21 days then do not take for 7 days.    . fluticasone furoate-vilanterol (BREO ELLIPTA) 200-25 MCG/INH AEPB Inhale 1 puff into the lungs daily. 90 each 1  . glucose blood (ONE TOUCH ULTRA TEST) test strip Use to monitor your glucose BID 300 each 3  . hydrochlorothiazide (HYDRODIURIL) 25 MG tablet Take 1  tablet (25 mg total) by mouth daily. 90 tablet 1  . HYDROcodone-acetaminophen (NORCO/VICODIN) 5-325 MG tablet hydrocodone 5 mg-acetaminophen 325 mg tablet    . ibuprofen (ADVIL,MOTRIN) 800 MG tablet ibuprofen 800 mg tablet    . Insulin Disposable Pump (V-GO 20) KIT 1 Device by Does not apply route daily. 30 kit 11  . JARDIANCE 25 MG TABS tablet TAKE 1 TABLET DAILY 90 tablet 1  . medroxyPROGESTERone (PROVERA) 5 MG tablet Take 5 mg by mouth daily.    . metFORMIN (GLUCOPHAGE) 1000 MG tablet TAKE 1 TABLET AT BEDTIME 90 tablet 4  . metoprolol succinate (TOPROL-XL) 100 MG 24 hr tablet TAKE 1 TABLET DAILY WITH/OR IMMEDIATELY FOLLOWING A MEAL 90 tablet 1  . montelukast (SINGULAIR) 10 MG tablet TAKE 1 TABLET AT BEDTIME (OFFICE VISIT NEEDED FOR ADDITIONAL REFILLS) 30 tablet 1  . Multiple Vitamin (MULTIVITAMIN) tablet Take 1 tablet by mouth daily.    Marland Kitchen NYAMYC powder     . nystatin-triamcinolone (MYCOLOG II) cream     . OSPHENA 60 MG TABS     . PROCTOSOL HC 2.5 % rectal cream     . telmisartan (MICARDIS) 80 MG tablet TAKE 1 TABLET DAILY 90 tablet 0  . zolpidem (AMBIEN) 10 MG tablet Take 1 tablet (10 mg total) by mouth at bedtime as needed for sleep. 90 tablet 3   No current facility-administered medications on file prior to visit.     Allergies  Allergen Reactions  . Ace Inhibitors Cough    Family History  Problem Relation Age of Onset  . Cancer Father        colon  . Cancer Mother        per patient stomach  . Diabetes Maternal Grandmother   . Mental illness Sister   . Heart disease Brother     BP 104/60 (BP Location: Right Arm, Patient Position: Sitting, Cuff Size: Large)   Pulse 66   Ht 5' 4.5" (1.638 m)   Wt 195 lb 12.8 oz (88.8 kg)   LMP 01/19/2011   SpO2 94%   BMI 33.09 kg/m    Review of Systems She denies hypoglycemia    Objective:   Physical Exam VITAL SIGNS:  See vs page GENERAL: no distress Pulses: dorsalis pedis intact bilat.   MSK: no deformity of the feet CV:  no leg edema Skin:  no ulcer on the feet.  normal  color and temp on the feet. Neuro: sensation is intact to touch on the feet  Lab Results  Component Value Date   HGBA1C 6.3 (A) 07/03/2018       Assessment & Plan:  Insulin-requiring type 2 DM: overcontrolled.   Patient Instructions  check your blood sugar twice a day.  vary the time of day when you check, between before the 3 meals, and at bedtime.  also check if you have symptoms of your blood sugar being too high or too low.  please keep a record of the readings and bring it to your next appointment here.  You can write it on any piece of paper.  please call us sooner if your blood sugar goes below 70, or if you have a lot of readings over 200.   Please come back for a follow-up appointment in 4 months.  Please continue the same Trulicity.  Please continue the same clicks: breakfast: 1 click; lunch: 1 click; and supper:4 clicks.

## 2018-07-03 NOTE — Patient Instructions (Addendum)
check your blood sugar twice a day.  vary the time of day when you check, between before the 3 meals, and at bedtime.  also check if you have symptoms of your blood sugar being too high or too low.  please keep a record of the readings and bring it to your next appointment here.  You can write it on any piece of paper.  please call us sooner if your blood sugar goes below 70, or if you have a lot of readings over 200.   Please come back for a follow-up appointment in 4 months.  Please continue the same Trulicity.  Please continue the same clicks: breakfast: 1 click; lunch: 1 click; and supper:4 clicks.

## 2018-07-14 ENCOUNTER — Other Ambulatory Visit: Payer: Self-pay | Admitting: Family Medicine

## 2018-07-14 MED ORDER — CARVEDILOL 25 MG PO TABS: 25.0000 mg | ORAL_TABLET | Freq: Every day | ORAL | 1 refills | Status: DC

## 2018-07-14 NOTE — Telephone Encounter (Signed)
Copied from [place] [id number]. Topic: Quick Communication - Rx Refill/Question >> Jul 14, 2018  8:36 AM Alanda Slim E wrote: Medication: carvedilol (COREG) 25 MG tablet   Has the patient contacted their pharmacy? Yes - no refills   Preferred Pharmacy (with phone number or street name): [facility], [place] [phone] (Phone) [phone] (Fax)    Agent: Please be advised that RX refills may take up to 3 business days. We ask that you follow-up with your pharmacy.

## 2018-07-18 ENCOUNTER — Other Ambulatory Visit: Payer: Self-pay | Admitting: Family Medicine

## 2018-07-21 ENCOUNTER — Ambulatory Visit: Payer: 59 | Admitting: Family Medicine

## 2018-07-30 ENCOUNTER — Other Ambulatory Visit: Payer: Self-pay

## 2018-07-30 ENCOUNTER — Telehealth (INDEPENDENT_AMBULATORY_CARE_PROVIDER_SITE_OTHER): Payer: POS | Admitting: Family Medicine

## 2018-07-30 DIAGNOSIS — Z124 Encounter for screening for malignant neoplasm of cervix: Secondary | ICD-10-CM

## 2018-07-30 DIAGNOSIS — E1139 Type 2 diabetes mellitus with other diabetic ophthalmic complication: Secondary | ICD-10-CM | POA: Diagnosis not present

## 2018-07-30 DIAGNOSIS — M5136 Other intervertebral disc degeneration, lumbar region: Secondary | ICD-10-CM | POA: Diagnosis not present

## 2018-07-30 DIAGNOSIS — Z0289 Encounter for other administrative examinations: Secondary | ICD-10-CM

## 2018-07-30 DIAGNOSIS — Z794 Long term (current) use of insulin: Secondary | ICD-10-CM | POA: Diagnosis not present

## 2018-07-30 MED ORDER — EMPAGLIFLOZIN 25 MG PO TABS: 25.0000 mg | ORAL_TABLET | Freq: Every day | ORAL | 3 refills | Status: DC

## 2018-07-30 NOTE — Progress Notes (Signed)
Pt requesting light duty paperwork update and needs refill on jardiance.  Also pt wants recommendation for gyn MD.

## 2018-07-30 NOTE — Progress Notes (Signed)
Telemedicine Encounter- SOAP NOTE Established Patient  This telephone encounter was conducted with the patient's (or proxy's) verbal consent via audio telecommunications: yes/no: Yes Patient was instructed to have this encounter in a suitably private space; and to only have persons present to whom they give permission to participate. In addition, patient identity was confirmed by use of name plus two identifiers (DOB and address).  I discussed the limitations, risks, security and privacy concerns of performing an evaluation and management service by telephone and the availability of in person appointments. I also discussed with the patient that there may be a patient responsible charge related to this service. The patient expressed understanding and agreed to proceed.  I spent a total of TIME; 0 MIN TO 60 MIN: 15 minutes talking with the patient or their proxy.  CC: diabetes med refill, work duty  Subjective   Heidi Howell is a 63 y.o. established patient. Telephone visit today for  HPI  Diabetes Mellitus: Patient presents for follow up of diabetes. Symptoms: none. Symptoms have stabilized. Patient denies hyperglycemia, hypoglycemia , increase appetite, nausea and paresthesia of the feet.  Evaluation to date has been included: hemoglobin A1C.   Lab Results  Component Value Date   HGBA1C 6.3 (A) 07/03/2018   Her fasting glucose this morning was 110 She typically in the low 110s She denies hypoglycemia. She is not exercising.  Wt Readings from Last 3 Encounters:  07/03/18 195 lb 12.8 oz (88.8 kg)  04/28/18 197 lb 6.4 oz (89.5 kg)  02/24/18 195 lb (88.5 kg)   Degenerative Disc Disease She  Needs accommodation letter for light duty for work She has chronic back pain She works at the [employer] and is on light duty She cannot bend, lift, twist without pain She uses rubs She is not exercising   Patient Active Problem List   Diagnosis Date Noted  . Heel pain, bilateral  11/19/2017  . Chronic right shoulder pain 09/23/2017  . Long-term use of aspirin therapy 09/23/2017  . Type 2 diabetes mellitus without complication, with long-term current use of insulin ([place]) 07/14/2015  . Essential hypertension 09/23/2014  . Adverse drug effect 02/12/2014  . Cortical cataract 08/17/2013  . Primary open angle glaucoma 08/17/2013  . No diabetic retinopathy in both eyes 08/17/2013  . Hyperopia 08/17/2013  . Astigmatism 08/17/2013  . Presbyopia 08/17/2013  . Obesity 08/20/2011  . Symptomatic menopausal or female climacteric states 08/20/2011  . Insomnia 08/20/2011  . Fibroids 08/20/2011    Past Medical History:  Diagnosis Date  . Anemia   . Bartholin gland cyst 06/04/2000  . Cyst   . Diabetes mellitus   . FH: colon cancer   . Fibroids   . Glaucoma   . H/O dysmenorrhea 06/04/2000  . H/O fatigue   . H/O menorrhagia 2001  . H/O: obesity   . Hypertension   . Infertility, female   . Menopausal symptoms   . Varicose vein   . Yeast infection     Current Outpatient Medications  Medication Sig Dispense Refill  . empagliflozin (JARDIANCE) 25 MG TABS tablet Take 25 mg by mouth daily. 90 tablet 3  . aspirin 81 MG tablet Take 81 mg by mouth daily.    Marland Kitchen BYSTOLIC 20 MG TABS Take 0.5 tablets (10 mg total) by mouth daily. 45 tablet 1  . carvedilol (COREG) 25 MG tablet Take 1 tablet (25 mg total) by mouth daily. 90 tablet 1  . Continuous Blood Gluc Receiver (FREESTYLE [place]  READER) DEVI USE AS DIRECTED ONCE 6 Device 3  . Continuous Blood Gluc Sensor (FREESTYLE LIBRE SENSOR SYSTEM) MISC 1 Device by Does not apply route once a week. 4 each 11  . Dulaglutide (TRULICITY) 1.5 WU/8.8BV SOPN Trulicity 1.5 QX/4.5 mL subcutaneous pen injector    . estrogens, conjugated, (PREMARIN) 0.3 MG tablet Take 0.3 mg by mouth daily. Take daily for 21 days then do not take for 7 days.    . fluticasone furoate-vilanterol (BREO ELLIPTA) 200-25 MCG/INH AEPB Inhale 1 puff into the lungs daily. 90  each 1  . glucose blood (ONE TOUCH ULTRA TEST) test strip Use to monitor your glucose BID 300 each 3  . hydrochlorothiazide (HYDRODIURIL) 25 MG tablet Take 1 tablet (25 mg total) by mouth daily. 90 tablet 1  . HYDROcodone-acetaminophen (NORCO/VICODIN) 5-325 MG tablet hydrocodone 5 mg-acetaminophen 325 mg tablet    . ibuprofen (ADVIL,MOTRIN) 800 MG tablet ibuprofen 800 mg tablet    . Insulin Disposable Pump (V-GO 20) KIT 1 Device by Does not apply route daily. 30 kit 11  . insulin regular (HUMULIN R) 100 units/mL injection FOR USE IN PUMP, TOTAL OF 60 UNITS PER DAY 20 mL 11  . medroxyPROGESTERone (PROVERA) 5 MG tablet Take 5 mg by mouth daily.    . metFORMIN (GLUCOPHAGE) 1000 MG tablet TAKE 1 TABLET AT BEDTIME 90 tablet 4  . metoprolol succinate (TOPROL-XL) 100 MG 24 hr tablet TAKE 1 TABLET DAILY WITH/OR IMMEDIATELY FOLLOWING A MEAL 90 tablet 1  . montelukast (SINGULAIR) 10 MG tablet TAKE 1 TABLET AT BEDTIME (OFFICE VISIT NEEDED FOR ADDITIONAL REFILLS) 90 tablet 0  . Multiple Vitamin (MULTIVITAMIN) tablet Take 1 tablet by mouth daily.    Marland Kitchen NYAMYC powder     . nystatin-triamcinolone (MYCOLOG II) cream     . OSPHENA 60 MG TABS     . PROCTOSOL HC 2.5 % rectal cream     . telmisartan (MICARDIS) 80 MG tablet TAKE 1 TABLET DAILY 90 tablet 0  . zolpidem (AMBIEN) 10 MG tablet Take 1 tablet (10 mg total) by mouth at bedtime as needed for sleep. 90 tablet 3   No current facility-administered medications for this visit.     Allergies  Allergen Reactions  . Ace Inhibitors Cough    Social History   Socioeconomic History  . Marital status: Married    Spouse name: Madelaine Etienne  . Number of children: Not on file  . Years of education: Not on file  . Highest education level: Not on file  Occupational History  . Not on file  Social Needs  . Financial resource strain: Not on file  . Food insecurity:    Worry: Not on file    Inability: Not on file  . Transportation needs:    Medical: Not on  file    Non-medical: Not on file  Tobacco Use  . Smoking status: Never Smoker  . Smokeless tobacco: Never Used  Substance and Sexual Activity  . Alcohol use: No    Alcohol/week: 0.0 standard drinks  . Drug use: No  . Sexual activity: Yes    Partners: Male    Comment: number of sex partners in the last 12 months 1  Lifestyle  . Physical activity:    Days per week: Not on file    Minutes per session: Not on file  . Stress: Not on file  Relationships  . Social connections:    Talks on phone: Not on file    Gets together: Not  on file    Attends religious service: Not on file    Active member of club or organization: Not on file    Attends meetings of clubs or organizations: Not on file    Relationship status: Not on file  . Intimate partner violence:    Fear of current or ex partner: Not on file    Emotionally abused: Not on file    Physically abused: Not on file    Forced sexual activity: Not on file  Other Topics Concern  . Not on file  Social History Narrative   Exercise walk 2-3 times for 30-40 minutes    ROS  Objective   Vitals as reported by the patient:   Blood pressure: 122/66 Blood glucose 110  There were no vitals filed for this visit.  Diagnoses and all orders for this visit:  Type 2 diabetes mellitus with other ophthalmic complication, with long-term current use of insulin ([place])-  Diabetes is well controlled Refilled jardiance  Screening for cervical cancer -     Ambulatory referral to Gynecology  Degenerative disc disease, lumbar-  Gave work accomodation Encouraged exercise and stretching  Other orders -     empagliflozin (JARDIANCE) 25 MG TABS tablet; Take 25 mg by mouth daily.     I discussed the assessment and treatment plan with the patient. The patient was provided an opportunity to ask questions and all were answered. The patient agreed with the plan and demonstrated an understanding of the instructions.   The patient was advised to call  back or seek an in-person evaluation if the symptoms worsen or if the condition fails to improve as anticipated.  I provided 15 minutes of non-face-to-face time during this encounter.  Forrest Moron, MD  Primary Care at [facility]

## 2018-08-04 ENCOUNTER — Telehealth: Payer: Self-pay | Admitting: Endocrinology

## 2018-08-04 NOTE — Telephone Encounter (Signed)
Patient has called stating that they would like a call back due to there V-Go. They received a letter in the mail they do not understand.   Please Advise, Thanks

## 2018-08-05 NOTE — Telephone Encounter (Signed)
I need to know more about what this message is asking.  Thank you.

## 2018-08-05 NOTE — Telephone Encounter (Signed)
Pt stated that she has a 30 day supply and would like to know what she can do, please advise

## 2018-08-06 NOTE — Telephone Encounter (Signed)
V-go was being discontinued, pt has 30 day supply and would like to know what can be done please advise.

## 2018-08-06 NOTE — Telephone Encounter (Signed)
There was a temp supply disruption, but that is better now.  Which other pharmacy or supplier does she want [place] to try?

## 2018-08-11 NOTE — Telephone Encounter (Signed)
Called pt to inquire if she had an alternate pharmacy or supplier she would like Rx for V-Go to be sent

## 2018-08-18 ENCOUNTER — Other Ambulatory Visit: Payer: Self-pay

## 2018-08-20 ENCOUNTER — Other Ambulatory Visit: Payer: Self-pay

## 2018-08-20 ENCOUNTER — Ambulatory Visit (INDEPENDENT_AMBULATORY_CARE_PROVIDER_SITE_OTHER): Payer: POS | Admitting: Gynecology

## 2018-08-20 ENCOUNTER — Other Ambulatory Visit: Payer: Self-pay | Admitting: *Deleted

## 2018-08-20 ENCOUNTER — Encounter: Payer: Self-pay | Admitting: Gynecology

## 2018-08-20 VITALS — BP 124/78 | Ht 64.0 in | Wt 197.0 lb

## 2018-08-20 DIAGNOSIS — N761 Subacute and chronic vaginitis: Secondary | ICD-10-CM

## 2018-08-20 DIAGNOSIS — N952 Postmenopausal atrophic vaginitis: Secondary | ICD-10-CM | POA: Diagnosis not present

## 2018-08-20 DIAGNOSIS — Z01419 Encounter for gynecological examination (general) (routine) without abnormal findings: Secondary | ICD-10-CM

## 2018-08-20 LAB — WET PREP FOR TRICH, YEAST, CLUE

## 2018-08-20 MED ORDER — FLUCONAZOLE 150 MG PO TABS: ORAL_TABLET | ORAL | 0 refills | Status: DC

## 2018-08-20 NOTE — Progress Notes (Signed)
    ALIZZON DIOGUARDI 10-01-1955 [id number]        63 y.o.  [id number] new patient for annual gynecologic exam.  Without gynecologic complaints.  Reports regular annual exam last year with Pap smear.  No history of abnormal Pap smears  Past medical history,surgical history, problem list, medications, allergies, family history and social history were all reviewed and documented as reviewed in the EPIC chart.  ROS:  Performed with pertinent positives and negatives included in the history, assessment and plan.   Additional significant findings : None   Exam: Caryn Bee assistant Vitals:   08/20/18 0901  BP: 124/78  Weight: 197 lb (89.4 kg)  Height: 5\' 4"  (1.626 m)   Body mass index is 33.81 kg/m.  General appearance:  Normal affect, orientation and appearance. Skin: Grossly normal HEENT: Without gross lesions.  No cervical or supraclavicular adenopathy. Thyroid normal.  Lungs:  Clear without wheezing, rales or rhonchi Cardiac: RR, without RMG Abdominal:  Soft, nontender, without masses, guarding, rebound, organomegaly or hernia Breasts:  Examined lying and sitting without masses, retractions, discharge or axillary adenopathy. Pelvic:  Ext, BUS, Vagina: With mild generalized erythema at the introital opening with white discharge  Cervix: With atrophic changes  Uterus: Grossly normal, difficult to palpate midline mobile nontender  Adnexa: Without masses or tenderness    Anus and perineum: Normal   Rectovaginal: Normal sphincter tone without palpated masses or tenderness.    Assessment/Plan:  63 y.o. [id number] female for annual gynecologic exam.   1. Vaginitis.  On questioning the patient did note some irritation.  Wet prep was positive for yeast.  Will treat with Diflucan 150 mg x 1 dose.  I gave her a number of 5 to have at home to use PRN as she does have a history of recurrent yeast infections in the past. 2. Postmenopausal.  No significant menopausal symptoms.  No vaginal bleeding.   Need to report any vaginal bleeding discussed. 3. Mammography 08/2017.  Recommended patient schedule screening mammogram when coronavirus restrictions lifted.  Breast exam normal today. 4. Colonoscopy 2019.  Recommended repeat interval 5 years. 5. Pap smear reported negative 2019.  No Pap smear done today.  No history of abnormal Pap smears previously.  Plan repeat Pap smear at 3-year interval per current screening guidelines. 6. DEXA never.  Recommended patient schedule baseline DEXA and she will follow-up for this after coronavirus restrictions lifted. 7. Health maintenance.  No routine lab work done as patient does this elsewhere.  Follow-up in 1 year, sooner as needed.   Anastasio Auerbach MD, 9:37 AM 08/20/2018

## 2018-08-20 NOTE — Patient Instructions (Addendum)
Take the Diflucan pill once to treat the yeast infection.  Repeat the dose as needed for recurrent vaginal irritation and discharge symptoms  Follow-up for the bone density as scheduled  Follow-up in 1 year for annual exam

## 2018-08-20 NOTE — Addendum Note (Signed)
Addended by: Nelva Nay on: 08/20/2018 12:50 PM   Modules accepted: Orders

## 2018-08-25 ENCOUNTER — Other Ambulatory Visit: Payer: Self-pay | Admitting: Endocrinology

## 2018-09-09 ENCOUNTER — Other Ambulatory Visit: Payer: Self-pay | Admitting: Family Medicine

## 2018-09-16 ENCOUNTER — Telehealth: Payer: Self-pay | Admitting: Family Medicine

## 2018-09-16 DIAGNOSIS — G4700 Insomnia, unspecified: Secondary | ICD-10-CM

## 2018-09-16 NOTE — Telephone Encounter (Signed)
Copied from [place] [id number]. Topic: Quick Communication - Rx Refill/Question >> Sep 16, 2018  2:47 PM Erick Blinks wrote: Medication: zolpidem [facility]) -pt called requesting refill. Please advise   Best Contact: [phone]  Has the patient contacted their pharmacy? yes (Agent: If no, request that the patient contact the pharmacy for the refill.) (Agent: If yes, when and what did the pharmacy advise?)  Preferred Pharmacy (with phone number or street name): [place], [place] [address]    Agent: Please be advised that RX refills may take up to 3 business days. We ask that you follow-up with your pharmacy.

## 2018-09-16 NOTE — Telephone Encounter (Signed)
Please advise 

## 2018-09-18 ENCOUNTER — Encounter: Payer: Self-pay | Admitting: Family Medicine

## 2018-09-18 MED ORDER — ZOLPIDEM TARTRATE 10 MG PO TABS: 10.0000 mg | ORAL_TABLET | Freq: Every evening | ORAL | 3 refills | Status: DC | PRN

## 2018-09-22 ENCOUNTER — Other Ambulatory Visit: Payer: Self-pay | Admitting: Endocrinology

## 2018-09-25 ENCOUNTER — Other Ambulatory Visit: Payer: Self-pay | Admitting: Endocrinology

## 2018-09-26 ENCOUNTER — Other Ambulatory Visit: Payer: Self-pay | Admitting: Endocrinology

## 2018-10-01 NOTE — Progress Notes (Signed)
Called pt and scheduled appt with her for diabets follow up  12/23/2018 FR

## 2018-10-08 ENCOUNTER — Other Ambulatory Visit: Payer: Self-pay | Admitting: Gynecology

## 2018-10-08 DIAGNOSIS — Z780 Asymptomatic menopausal state: Secondary | ICD-10-CM

## 2018-10-08 DIAGNOSIS — Z1231 Encounter for screening mammogram for malignant neoplasm of breast: Secondary | ICD-10-CM

## 2018-10-08 DIAGNOSIS — Z01419 Encounter for gynecological examination (general) (routine) without abnormal findings: Secondary | ICD-10-CM

## 2018-10-12 ENCOUNTER — Other Ambulatory Visit: Payer: Self-pay | Admitting: Family Medicine

## 2018-11-04 ENCOUNTER — Encounter: Payer: Self-pay | Admitting: Endocrinology

## 2018-11-04 ENCOUNTER — Ambulatory Visit: Payer: 59 | Admitting: Endocrinology

## 2018-11-04 ENCOUNTER — Other Ambulatory Visit: Payer: Self-pay

## 2018-11-04 VITALS — BP 116/64 | HR 76 | Ht 64.0 in | Wt 195.4 lb

## 2018-11-04 DIAGNOSIS — E1165 Type 2 diabetes mellitus with hyperglycemia: Secondary | ICD-10-CM | POA: Diagnosis not present

## 2018-11-04 DIAGNOSIS — E119 Type 2 diabetes mellitus without complications: Secondary | ICD-10-CM | POA: Diagnosis not present

## 2018-11-04 DIAGNOSIS — Z794 Long term (current) use of insulin: Secondary | ICD-10-CM | POA: Diagnosis not present

## 2018-11-04 LAB — POCT GLYCOSYLATED HEMOGLOBIN (HGB A1C): Hemoglobin A1C: 6.8 % — AB (ref 4.0–5.6)

## 2018-11-04 NOTE — Progress Notes (Signed)
Subjective:    Patient ID: Heidi Howell, female    DOB: 01-Jan-1956, 63 y.o.   MRN: [id number]  HPI Pt returns for f/u of diabetes mellitus: DM type: Insulin-requiring type 2. Dx'ed: 2007.  Complications: none.  Therapy: insulin since 4327, trulicity, metformin, and jardiance.   GDM: never DKA: never Severe hypoglycemia: never.  Pancreatitis: never.  Other: she is pursuing weight-loss surgery; she started  V-GO-20 pump in early 2017; she has "freestyle libre" device.   Interval history:   She takes these clicks: breakfast: 1, lunch: 1 and supper: 4 (1 click is 2 units).  no cbg record, but states cbg's vary from 84-180.  It is in general higher as the day goes on.  pt states she feels well in general.  She has not recently used continuous glucose monitor.   Past Medical History:  Diagnosis Date  . Anemia   . Diabetes mellitus without complication ([place])   . Fibroids   . Glaucoma   . H/O: obesity   . Hypertension   . Varicose vein     Past Surgical History:  Procedure Laterality Date  . Chambers GLAND CYST EXCISION  2010  . CESAREAN SECTION  [id number]  . DILATION AND CURETTAGE OF UTERUS  1989  . TUBAL LIGATION  1996   bilateral    Social History   Socioeconomic History  . Marital status: Married    Spouse name: Madelaine Etienne  . Number of children: Not on file  . Years of education: Not on file  . Highest education level: Not on file  Occupational History  . Not on file  Social Needs  . Financial resource strain: Not on file  . Food insecurity    Worry: Not on file    Inability: Not on file  . Transportation needs    Medical: Not on file    Non-medical: Not on file  Tobacco Use  . Smoking status: Never Smoker  . Smokeless tobacco: Never Used  Substance and Sexual Activity  . Alcohol use: No    Alcohol/week: 0.0 standard drinks  . Drug use: No  . Sexual activity: Yes    Partners: Male    Birth control/protection: Post-menopausal    Comment: 1st  intercourse 28 yo-5 partners  Lifestyle  . Physical activity    Days per week: Not on file    Minutes per session: Not on file  . Stress: Not on file  Relationships  . Social Herbalist on phone: Not on file    Gets together: Not on file    Attends religious service: Not on file    Active member of club or organization: Not on file    Attends meetings of clubs or organizations: Not on file    Relationship status: Not on file  . Intimate partner violence    Fear of current or ex partner: Not on file    Emotionally abused: Not on file    Physically abused: Not on file    Forced sexual activity: Not on file  Other Topics Concern  . Not on file  Social History Narrative   Exercise walk 2-3 times for 30-40 minutes    Current Outpatient Medications on File Prior to Visit  Medication Sig Dispense Refill  . aspirin 81 MG tablet Take 81 mg by mouth daily.    Marland Kitchen BYSTOLIC 20 MG TABS Take 0.5 tablets (10 mg total) by mouth daily. 45 tablet 1  . carvedilol (COREG) 25  MG tablet Take 1 tablet (25 mg total) by mouth daily. 90 tablet 1  . Continuous Blood Gluc Receiver (FREESTYLE LIBRE READER) DEVI USE AS DIRECTED ONCE 6 Device 3  . Continuous Blood Gluc Sensor (FREESTYLE LIBRE SENSOR SYSTEM) MISC 1 Device by Does not apply route once a week. 4 each 11  . empagliflozin (JARDIANCE) 25 MG TABS tablet Take 25 mg by mouth daily. 90 tablet 3  . estrogens, conjugated, (PREMARIN) 0.3 MG tablet Take 0.3 mg by mouth daily. Take daily for 21 days then do not take for 7 days.    . fluconazole (DIFLUCAN) 150 MG tablet Take one tablet daily as needed for yeast. 5 tablet 0  . fluticasone furoate-vilanterol (BREO ELLIPTA) 200-25 MCG/INH AEPB Inhale 1 puff into the lungs daily. 90 each 1  . glucose blood (ONE TOUCH ULTRA TEST) test strip Use to monitor your glucose BID 300 each 3  . hydrochlorothiazide (HYDRODIURIL) 25 MG tablet TAKE 1 TABLET DAILY 90 tablet 3  . HYDROcodone-acetaminophen  (NORCO/VICODIN) 5-325 MG tablet hydrocodone 5 mg-acetaminophen 325 mg tablet    . ibuprofen (ADVIL,MOTRIN) 800 MG tablet ibuprofen 800 mg tablet    . Insulin Disposable Pump (V-GO 20) KIT USE AS DIRECTED 1 kit 5  . insulin regular (HUMULIN R) 100 units/mL injection INJECT 90 UNITS PER DAY, FOR USE IN PUMP 30 mL 0  . medroxyPROGESTERone (PROVERA) 5 MG tablet Take 5 mg by mouth daily.    . metFORMIN (GLUCOPHAGE) 1000 MG tablet TAKE 1 TABLET AT BEDTIME 90 tablet 4  . metoprolol succinate (TOPROL-XL) 100 MG 24 hr tablet TAKE 1 TABLET DAILY WITH/OR IMMEDIATELY FOLLOWING A MEAL 90 tablet 1  . montelukast (SINGULAIR) 10 MG tablet TAKE 1 TABLET AT BEDTIME (OFFICE VISIT NEEDED FOR ADDITIONAL REFILLS) 90 tablet 0  . Multiple Vitamin (MULTIVITAMIN) tablet Take 1 tablet by mouth daily.    Marland Kitchen NYAMYC powder     . nystatin-triamcinolone (MYCOLOG II) cream     . OSPHENA 60 MG TABS     . PROCTOSOL HC 2.5 % rectal cream     . telmisartan (MICARDIS) 80 MG tablet TAKE 1 TABLET DAILY 90 tablet 0  . TRULICITY 1.5 SW/5.4OE SOPN INJECT 1.5 MG UNDER THE SKIN ONCE A WEEK 6 mL 3  . zolpidem (AMBIEN) 10 MG tablet Take 1 tablet (10 mg total) by mouth at bedtime as needed for sleep. 90 tablet 3   No current facility-administered medications on file prior to visit.     Allergies  Allergen Reactions  . Ace Inhibitors Cough    Family History  Problem Relation Age of Onset  . Cancer Father        colon  . Cancer Mother        per patient stomach  . Diabetes Maternal Grandmother   . Mental illness Sister   . Heart disease Brother     BP 116/64 (BP Location: Left Arm, Patient Position: Sitting, Cuff Size: Large)   Pulse 76   Ht '5\' 4"'  (1.626 m)   Wt 195 lb 6.4 oz (88.6 kg)   LMP 01/19/2011   SpO2 97%   BMI 33.54 kg/m    Review of Systems She denies hypoglycemia    Objective:   Physical Exam VITAL SIGNS:  See vs page.  GENERAL: no distress.  Pulses: dorsalis pedis intact bilat.   MSK: no deformity of  the feet.  CV: no leg edema.  Skin:  no ulcer on the feet.  normal color and temp  on the feet.   Neuro: sensation is intact to touch on the feet.    Lab Results  Component Value Date   HGBA1C 6.8 (A) 11/04/2018       Assessment & Plan:  Insulin-requiring type 2 DM: well-controlled Obesity, persistent: continue non-insulin meds to try to help with this  Patient Instructions  check your blood sugar twice a day.  vary the time of day when you check, between before the 3 meals, and at bedtime.  also check if you have symptoms of your blood sugar being too high or too low.  please keep a record of the readings and bring it to your next appointment here.  You can write it on any piece of paper.  please call us sooner if your blood sugar goes below 70, or if you have a lot of readings over 200.   Please come back for a follow-up appointment in 4 months.   Please continue the same clicks: breakfast: 1 click; lunch: 1 click; and supper:4 clicks.    Please continue the same other diabetes medications

## 2018-11-04 NOTE — Patient Instructions (Addendum)
check your blood sugar twice a day.  vary the time of day when you check, between before the 3 meals, and at bedtime.  also check if you have symptoms of your blood sugar being too high or too low.  please keep a record of the readings and bring it to your next appointment here.  You can write it on any piece of paper.  please call us sooner if your blood sugar goes below 70, or if you have a lot of readings over 200.   Please come back for a follow-up appointment in 4 months.   Please continue the same clicks: breakfast: 1 click; lunch: 1 click; and supper:4 clicks.    Please continue the same other diabetes medications

## 2018-11-06 ENCOUNTER — Telehealth: Payer: Self-pay | Admitting: Family Medicine

## 2018-11-06 NOTE — Telephone Encounter (Signed)
Medication Refill - Medication: empagliflozin (JARDIANCE) 25 MG TABS tablet [facility]   BYSTOLIC 20 MG TABS [facility]   Has the patient contacted their pharmacy? Yes.   (Agent: If no, request that the patient contact the pharmacy for the refill.) (Agent: If yes, when and what did the pharmacy advise?)  Preferred Pharmacy (with phone number or street name):  [place] ([place]), [place] - [address]  [address] [place]) [place] [address]  Phone: [phone] Fax: [phone]  And [place], [place]  [address] [place] [address]  Phone: [phone] Fax: [phone]   Agent: Please be advised that RX refills may take up to 3 business days. We ask that you follow-up with your pharmacy.

## 2018-11-12 NOTE — Telephone Encounter (Signed)
Pt calling to f/up on this request.    Jardiance should be sent to:  [place] ([place]), [place] - [address]  [address] [place] ([place]) [place] [address]  Phone: [phone] Fax: [phone]   Bystolic should be sent to:  [place] ([place]), [place] - [address]  [address] [place] ([place] [address]  Phone: [phone] Fax: [phone]

## 2018-11-17 NOTE — Telephone Encounter (Signed)
Pt calling to check status.   Pt states that bystolic sent to  [place], [place] [phone] (Phone) [phone]

## 2018-11-18 ENCOUNTER — Other Ambulatory Visit: Payer: Self-pay | Admitting: Endocrinology

## 2018-11-20 ENCOUNTER — Other Ambulatory Visit: Payer: Self-pay

## 2018-11-20 MED ORDER — JARDIANCE 25 MG PO TABS: 25.0000 mg | ORAL_TABLET | Freq: Every day | ORAL | 2 refills | Status: DC

## 2018-11-20 MED ORDER — BYSTOLIC 20 MG PO TABS: 10.0000 mg | ORAL_TABLET | Freq: Every day | ORAL | 2 refills | Status: DC

## 2018-11-25 ENCOUNTER — Ambulatory Visit
Admission: RE | Admit: 2018-11-25 | Discharge: 2018-11-25 | Disposition: A | Discharge status: Home / Self Care | Payer: 59 | Source: Ambulatory Visit | Attending: Gynecology | Admitting: Gynecology

## 2018-11-25 ENCOUNTER — Other Ambulatory Visit: Payer: Self-pay

## 2018-11-25 DIAGNOSIS — Z01419 Encounter for gynecological examination (general) (routine) without abnormal findings: Secondary | ICD-10-CM

## 2018-11-25 DIAGNOSIS — Z1231 Encounter for screening mammogram for malignant neoplasm of breast: Secondary | ICD-10-CM

## 2018-11-25 DIAGNOSIS — Z780 Asymptomatic menopausal state: Secondary | ICD-10-CM

## 2018-11-26 ENCOUNTER — Encounter: Payer: Self-pay | Admitting: Gynecology

## 2018-12-01 ENCOUNTER — Telehealth: Payer: Self-pay | Admitting: Endocrinology

## 2018-12-01 NOTE — Telephone Encounter (Signed)
MEDICATION: Trulicity  PHARMACY:  [facility]  IS THIS A 90 DAY SUPPLY :   IS PATIENT OUT OF MEDICATION:   IF NOT; HOW MUCH IS LEFT: 2 weeks  LAST APPOINTMENT DATE: @7 /14/2020  NEXT APPOINTMENT DATE:@11 /06/2018  DO WE HAVE YOUR PERMISSION TO LEAVE A DETAILED MESSAGE:  OTHER COMMENTS:    **Let patient know to contact pharmacy at the end of the day to make sure medication is ready. **  ** Please notify patient to allow 48-72 hours to process**  **Encourage patient to contact the pharmacy for refills or they can request refills through [facility]**

## 2018-12-02 ENCOUNTER — Other Ambulatory Visit: Payer: Self-pay

## 2018-12-02 DIAGNOSIS — Z794 Long term (current) use of insulin: Secondary | ICD-10-CM

## 2018-12-02 DIAGNOSIS — E1165 Type 2 diabetes mellitus with hyperglycemia: Secondary | ICD-10-CM

## 2018-12-02 MED ORDER — TRULICITY 1.5 MG/0.5ML ~~LOC~~ SOAJ: SUBCUTANEOUS | 3 refills | Status: DC

## 2018-12-02 NOTE — Telephone Encounter (Signed)
Dulaglutide (TRULICITY) 1.5 KI/8.3GX SOPN 6 mL 3 12/02/2018    Sig: INJECT 1.5 MG UNDER THE SKIN ONCE A WEEK   Sent to pharmacy as: Dulaglutide (TRULICITY) 1.5 EX/5.9RH Solution Pen-injector   E-Prescribing Status: Receipt confirmed by pharmacy (12/02/2018 7:34 AM EDT)

## 2018-12-03 ENCOUNTER — Telehealth: Payer: Self-pay | Admitting: Family Medicine

## 2018-12-03 MED ORDER — BREO ELLIPTA 200-25 MCG/INH IN AEPB: 1.0000 | INHALATION_SPRAY | Freq: Every day | RESPIRATORY_TRACT | 1 refills | Status: DC

## 2018-12-03 NOTE — Telephone Encounter (Signed)
rx sent to pharmacy

## 2018-12-03 NOTE — Telephone Encounter (Signed)
Relation to pt: self  Call back number: [phone] Pharmacy: [place], [place] [phone] (Phone) [phone] (Fax)     Reason for call:  Patient requesting fluticasone furoate-vilanterol (BREO ELLIPTA) 200-25 MCG/INH AEPB, patient informed please allow 48 to 72 hour turn around time

## 2018-12-16 MED ORDER — BYSTOLIC 20 MG PO TABS: 10.0000 mg | ORAL_TABLET | Freq: Every day | ORAL | 2 refills | Status: DC

## 2018-12-16 MED ORDER — JARDIANCE 25 MG PO TABS: 25.0000 mg | ORAL_TABLET | Freq: Every day | ORAL | 2 refills | Status: DC

## 2018-12-16 NOTE — Telephone Encounter (Signed)
Patient notified that medications were sent to the requested [facility].

## 2018-12-16 NOTE — Telephone Encounter (Signed)
Medication sent to [facility]. Please notify the patient.

## 2018-12-23 ENCOUNTER — Encounter: Payer: Self-pay | Admitting: Family Medicine

## 2018-12-23 ENCOUNTER — Ambulatory Visit: Payer: POS | Admitting: Family Medicine

## 2018-12-23 ENCOUNTER — Other Ambulatory Visit: Payer: Self-pay

## 2018-12-23 VITALS — BP 139/81 | HR 71 | Temp 98.3°F | Resp 17 | Ht 64.0 in | Wt 192.8 lb

## 2018-12-23 DIAGNOSIS — E7800 Pure hypercholesterolemia, unspecified: Secondary | ICD-10-CM

## 2018-12-23 DIAGNOSIS — Z760 Encounter for issue of repeat prescription: Secondary | ICD-10-CM

## 2018-12-23 DIAGNOSIS — E1139 Type 2 diabetes mellitus with other diabetic ophthalmic complication: Secondary | ICD-10-CM | POA: Diagnosis not present

## 2018-12-23 DIAGNOSIS — I10 Essential (primary) hypertension: Secondary | ICD-10-CM

## 2018-12-23 DIAGNOSIS — Z794 Long term (current) use of insulin: Secondary | ICD-10-CM

## 2018-12-23 DIAGNOSIS — F5101 Primary insomnia: Secondary | ICD-10-CM

## 2018-12-23 MED ORDER — METFORMIN HCL 1000 MG PO TABS: 1000.0000 mg | ORAL_TABLET | Freq: Every day | ORAL | 3 refills | Status: DC

## 2018-12-23 MED ORDER — ZOLPIDEM TARTRATE 10 MG PO TABS: 10.0000 mg | ORAL_TABLET | Freq: Every evening | ORAL | 3 refills | Status: DC | PRN

## 2018-12-23 NOTE — Patient Instructions (Signed)
° ° ° °  If you have lab work done today you will be contacted with your lab results within the next 2 weeks.  If you have not heard from us then please contact us. The fastest way to get your results is to register for My Chart. ° ° °IF you received an x-ray today, you will receive an invoice from [facility]. Please contact [facility] at [phone] with questions or concerns regarding your invoice.  ° °IF you received labwork today, you will receive an invoice from [facility]. Please contact [facility] at [phone] with questions or concerns regarding your invoice.  ° °Our billing staff will not be able to assist you with questions regarding bills from these companies. ° °You will be contacted with the lab results as soon as they are available. The fastest way to get your results is to activate your My Chart account. Instructions are located on the last page of this paperwork. If you have not heard from us regarding the results in 2 weeks, please contact this office. °  ° ° ° °

## 2018-12-23 NOTE — Progress Notes (Signed)
Established Patient Office Visit  Subjective:  Patient ID: Heidi Howell, female    DOB: 01-Dec-1955  Age: 63 y.o. MRN: [id number]  CC:  Chief Complaint  Patient presents with  . Diabetes    followup    HPI Heidi Howell presents for   Diabetes Mellitus: Patient presents for follow up of diabetes. Symptoms: hyperglycemia. Symptoms have stabilized. Patient denies hypoglycemia , increase appetite, paresthesia of the feet, polydipsia, polyuria and visual disturbances.  Evaluation to date has been included: hemoglobin A1C.  Home sugars: BGs consistently in an acceptable range.  Lab Results  Component Value Date   HGBA1C 6.8 (A) 11/04/2018   Wt Readings from Last 3 Encounters:  12/23/18 192 lb 12.8 oz (87.5 kg)  11/04/18 195 lb 6.4 oz (88.6 kg)  08/20/18 197 lb (89.4 kg)   Hypertension: Patient here for follow-up of elevated blood pressure. She is not exercising and is adherent to low salt diet.  Blood pressure is well controlled at home. Cardiac symptoms none. Patient denies chest pain, chest pressure/discomfort, claudication, exertional chest pressure/discomfort, fatigue, lower extremity edema and near-syncope.  Cardiovascular risk factors: advanced age (older than 11 for men, 62 for women), dyslipidemia, family history of premature cardiovascular disease and hypertension. Use of agents associated with hypertension: none. History of target organ damage: none. BP Readings from Last 3 Encounters:  12/23/18 139/81  11/04/18 116/64  08/20/18 124/78     Past Medical History:  Diagnosis Date  . Anemia   . Diabetes mellitus without complication ([place])   . Fibroids   . Glaucoma   . H/O: obesity   . Hypertension   . Varicose vein     Past Surgical History:  Procedure Laterality Date  . [place] GLAND CYST EXCISION  2010  . CESAREAN SECTION  [id number]  . DILATION AND CURETTAGE OF UTERUS  1989  . TUBAL LIGATION  1996   bilateral    Family History  Problem Relation Age of  Onset  . Cancer Father        colon  . Cancer Mother        per patient stomach  . Diabetes Maternal Grandmother   . Mental illness Sister   . Heart disease Brother     Social History   Socioeconomic History  . Marital status: Married    Spouse name: Madelaine Etienne  . Number of children: Not on file  . Years of education: Not on file  . Highest education level: Not on file  Occupational History  . Not on file  Social Needs  . Financial resource strain: Not on file  . Food insecurity    Worry: Not on file    Inability: Not on file  . Transportation needs    Medical: Not on file    Non-medical: Not on file  Tobacco Use  . Smoking status: Never Smoker  . Smokeless tobacco: Never Used  Substance and Sexual Activity  . Alcohol use: No    Alcohol/week: 0.0 standard drinks  . Drug use: No  . Sexual activity: Yes    Partners: Male    Birth control/protection: Post-menopausal    Comment: 1st intercourse 3 yo-5 partners  Lifestyle  . Physical activity    Days per week: Not on file    Minutes per session: Not on file  . Stress: Not on file  Relationships  . Social Herbalist on phone: Not on file    Gets together: Not on file  Attends religious service: Not on file    Active member of club or organization: Not on file    Attends meetings of clubs or organizations: Not on file    Relationship status: Not on file  . Intimate partner violence    Fear of current or ex partner: Not on file    Emotionally abused: Not on file    Physically abused: Not on file    Forced sexual activity: Not on file  Other Topics Concern  . Not on file  Social History Narrative   Exercise walk 2-3 times for 30-40 minutes    Outpatient Medications Prior to Visit  Medication Sig Dispense Refill  . aspirin 81 MG tablet Take 81 mg by mouth daily.    Marland Kitchen BYSTOLIC 20 MG TABS Take 0.5 tablets (10 mg total) by mouth daily. 45 tablet 2  . carvedilol (COREG) 25 MG tablet Take 1 tablet  (25 mg total) by mouth daily. 90 tablet 1  . Continuous Blood Gluc Receiver (FREESTYLE LIBRE READER) DEVI USE AS DIRECTED ONCE 6 Device 3  . Continuous Blood Gluc Sensor (FREESTYLE LIBRE SENSOR SYSTEM) MISC 1 Device by Does not apply route once a week. 4 each 11  . Dulaglutide (TRULICITY) 1.5 FG/1.8EX SOPN INJECT 1.5 MG UNDER THE SKIN ONCE A WEEK 6 mL 3  . empagliflozin (JARDIANCE) 25 MG TABS tablet Take 25 mg by mouth daily. 90 tablet 2  . estrogens, conjugated, (PREMARIN) 0.3 MG tablet Take 0.3 mg by mouth daily. Take daily for 21 days then do not take for 7 days.    . fluconazole (DIFLUCAN) 150 MG tablet Take one tablet daily as needed for yeast. 5 tablet 0  . fluticasone furoate-vilanterol (BREO ELLIPTA) 200-25 MCG/INH AEPB Inhale 1 puff into the lungs daily. 90 each 1  . glucose blood (ONE TOUCH ULTRA TEST) test strip Use to monitor your glucose BID 300 each 3  . hydrochlorothiazide (HYDRODIURIL) 25 MG tablet TAKE 1 TABLET DAILY 90 tablet 3  . HYDROcodone-acetaminophen (NORCO/VICODIN) 5-325 MG tablet hydrocodone 5 mg-acetaminophen 325 mg tablet    . ibuprofen (ADVIL,MOTRIN) 800 MG tablet ibuprofen 800 mg tablet    . Insulin Disposable Pump (V-GO 20) KIT USE AS DIRECTED 1 kit 5  . insulin regular (HUMULIN R) 100 units/mL injection INJECT 90 UNITS SUBCUTANEOUSLY PER DAY, FOR USE IN PUMP 30 mL 0  . medroxyPROGESTERone (PROVERA) 5 MG tablet Take 5 mg by mouth daily.    . metFORMIN (GLUCOPHAGE) 1000 MG tablet TAKE 1 TABLET AT BEDTIME 90 tablet 4  . metoprolol succinate (TOPROL-XL) 100 MG 24 hr tablet TAKE 1 TABLET DAILY WITH/OR IMMEDIATELY FOLLOWING A MEAL 90 tablet 1  . montelukast (SINGULAIR) 10 MG tablet TAKE 1 TABLET AT BEDTIME (OFFICE VISIT NEEDED FOR ADDITIONAL REFILLS) 90 tablet 0  . Multiple Vitamin (MULTIVITAMIN) tablet Take 1 tablet by mouth daily.    Marland Kitchen NYAMYC powder     . nystatin-triamcinolone (MYCOLOG II) cream     . OSPHENA 60 MG TABS     . PROCTOSOL HC 2.5 % rectal cream     .  telmisartan (MICARDIS) 80 MG tablet TAKE 1 TABLET DAILY 90 tablet 0  . zolpidem (AMBIEN) 10 MG tablet Take 1 tablet (10 mg total) by mouth at bedtime as needed for sleep. 90 tablet 3   No facility-administered medications prior to visit.     Allergies  Allergen Reactions  . Ace Inhibitors Cough    ROS Review of Systems Review of Systems  Constitutional: Negative for activity change, appetite change, chills and fever.  HENT: Negative for congestion, nosebleeds, trouble swallowing and voice change.   Respiratory: Negative for cough, shortness of breath and wheezing.   Gastrointestinal: Negative for diarrhea, nausea and vomiting.  Genitourinary: Negative for difficulty urinating, dysuria, flank pain and hematuria.  Musculoskeletal: Negative for back pain, joint swelling and neck pain.  Neurological: Negative for dizziness, speech difficulty, light-headedness and numbness.  See HPI. All other review of systems negative.     Objective:    Physical Exam  BP 139/81 (BP Location: Left Arm, Patient Position: Sitting, Cuff Size: Large)   Pulse 71   Temp 98.3 F (36.8 C) (Oral)   Resp 17   Ht '5\' 4"'  (1.626 m)   Wt 192 lb 12.8 oz (87.5 kg)   LMP 01/19/2011   SpO2 97%   BMI 33.09 kg/m  Wt Readings from Last 3 Encounters:  12/23/18 192 lb 12.8 oz (87.5 kg)  11/04/18 195 lb 6.4 oz (88.6 kg)  08/20/18 197 lb (89.4 kg)   Physical Exam  Constitutional: Oriented to person, place, and time. Appears well-developed and well-nourished.  HENT:  Head: Normocephalic and atraumatic.  Eyes: Conjunctivae and EOM are normal.  Cardiovascular: Normal rate, regular rhythm, normal heart sounds and intact distal pulses.  No murmur heard. Pulmonary/Chest: Effort normal and breath sounds normal. No stridor. No respiratory distress. Has no wheezes.  Neurological: Is alert and oriented to person, place, and time.  Skin: Skin is warm. Capillary refill takes less than 2 seconds.  Psychiatric: Has a  normal mood and affect. Behavior is normal. Judgment and thought content normal.    Health Maintenance Due  Topic Date Due  . HIV Screening  07/19/1972  . OPHTHALMOLOGY EXAM  07/12/2017  . COLONOSCOPY  07/19/2017  . INFLUENZA VACCINE  12/06/2018    There are no preventive care reminders to display for this patient.  Lab Results  Component Value Date   TSH 1.170 11/20/2016   Lab Results  Component Value Date   WBC 8.8 11/20/2016   HGB 11.6 11/20/2016   HCT 36.3 11/20/2016   MCV 82 11/20/2016   PLT 279 11/20/2016   Lab Results  Component Value Date   NA 143 12/30/2017   K 4.4 12/30/2017   CO2 25 12/30/2017   GLUCOSE 92 12/30/2017   BUN 12 12/30/2017   CREATININE 0.89 12/30/2017   BILITOT 0.5 12/30/2017   ALKPHOS 88 12/30/2017   AST 17 12/30/2017   ALT 21 12/30/2017   PROT 6.9 12/30/2017   ALBUMIN 4.3 12/30/2017   CALCIUM 9.7 12/30/2017   ANIONGAP 18 (H) 04/15/2014   Lab Results  Component Value Date   CHOL 158 12/30/2017   Lab Results  Component Value Date   HDL 43 12/30/2017   Lab Results  Component Value Date   LDLCALC 85 12/30/2017   Lab Results  Component Value Date   TRIG 151 (H) 12/30/2017   Lab Results  Component Value Date   CHOLHDL 3.7 12/30/2017   Lab Results  Component Value Date   HGBA1C 6.8 (A) 11/04/2018      Assessment & Plan:   Problem List Items Addressed This Visit      Cardiovascular and Mediastinum   Essential hypertension - Patient's blood pressure is at goal of 139/89 or less. Condition is stable. Continue current medications and treatment plan. I recommend that you exercise for 30-45 minutes 5 days a week. I also recommend a balanced diet with fruits  and vegetables every day, lean meats, and little fried foods. The DASH diet (you can find this online) is a good example of this.     Other Visit Diagnoses    Type 2 diabetes mellitus with other ophthalmic complication, with long-term current use of insulin ([place])    -   Primary well controlled hemoglobin a1c is at goal Continue exercise Lipids monitored and renal function in range On metformin On arb On asa 54m Reviewed diabetic foot care Emphasized importance of eye and dental exam      Elevated cholesterol          No orders of the defined types were placed in this encounter.   Follow-up: No follow-ups on file.    ZForrest Moron MD

## 2018-12-26 ENCOUNTER — Other Ambulatory Visit: Payer: Self-pay | Admitting: Family Medicine

## 2018-12-26 NOTE — Telephone Encounter (Signed)
Forwarding medication refill request to clinical pool for review. 

## 2019-01-08 ENCOUNTER — Other Ambulatory Visit: Payer: Self-pay | Admitting: Family Medicine

## 2019-01-10 ENCOUNTER — Other Ambulatory Visit: Payer: Self-pay | Admitting: Endocrinology

## 2019-01-28 ENCOUNTER — Other Ambulatory Visit: Payer: Self-pay | Admitting: Family Medicine

## 2019-02-03 ENCOUNTER — Encounter: Payer: Self-pay | Admitting: Gynecology

## 2019-02-25 ENCOUNTER — Other Ambulatory Visit: Payer: Self-pay | Admitting: Endocrinology

## 2019-03-05 ENCOUNTER — Other Ambulatory Visit: Payer: Self-pay

## 2019-03-09 ENCOUNTER — Telehealth: Payer: Self-pay

## 2019-03-09 ENCOUNTER — Other Ambulatory Visit: Payer: Self-pay

## 2019-03-09 ENCOUNTER — Encounter: Payer: Self-pay | Admitting: Endocrinology

## 2019-03-09 ENCOUNTER — Ambulatory Visit: Payer: 59 | Admitting: Endocrinology

## 2019-03-09 VITALS — BP 122/60 | HR 73 | Ht 64.0 in | Wt 191.4 lb

## 2019-03-09 DIAGNOSIS — Z794 Long term (current) use of insulin: Secondary | ICD-10-CM

## 2019-03-09 DIAGNOSIS — E1165 Type 2 diabetes mellitus with hyperglycemia: Secondary | ICD-10-CM

## 2019-03-09 DIAGNOSIS — E119 Type 2 diabetes mellitus without complications: Secondary | ICD-10-CM

## 2019-03-09 LAB — POCT GLYCOSYLATED HEMOGLOBIN (HGB A1C): Hemoglobin A1C: 5.8 % — AB (ref 4.0–5.6)

## 2019-03-09 MED ORDER — SEMAGLUTIDE 14 MG PO TABS: 14.0000 mg | ORAL_TABLET | Freq: Every day | ORAL | 3 refills | Status: DC

## 2019-03-09 NOTE — Patient Instructions (Addendum)
check your blood sugar twice a day.  vary the time of day when you check, between before the 3 meals, and at bedtime.  also check if you have symptoms of your blood sugar being too high or too low.  please keep a record of the readings and bring it to your next appointment here.  You can write it on any piece of paper.  please call us sooner if your blood sugar goes below 70, or if you have a lot of readings over 200.   I have sent a prescription to your pharmacy, to change the Trulicity to the once a day by mouth. Please continue the same clicks: breakfast: no clicks; lunch: 1 click; and supper:4 clicks.    Also, you should eat breakfast when you wake up.   Please continue the same other diabetes medications.    Please come back for a follow-up appointment in 4 months

## 2019-03-09 NOTE — Telephone Encounter (Signed)
Semaglutide 14 MG TABS 90 tablet 3 03/09/2019    Sig - Route: Take 14 mg by mouth daily. - Oral   Sent to pharmacy as: Semaglutide 14 MG Tab   E-Prescribing Status: Receipt confirmed by pharmacy (03/09/2019 4:17 PM EST)

## 2019-03-09 NOTE — Telephone Encounter (Signed)
Patient called to get the new Rx that Dr. Loanne Drilling prescribed today sent to the [facility] for a 30 day supply-she stated sending it to [facility] for 90 days was too expensive

## 2019-03-09 NOTE — Progress Notes (Signed)
Subjective:    Patient ID: Heidi Howell, female    DOB: 17-Sep-1955, 63 y.o.   MRN: [id number]  HPI Pt returns for f/u of diabetes mellitus: DM type: Insulin-requiring type 2. Dx'ed: 2007.  Complications: none.  Therapy: insulin since 3329, trulicity, metformin, and jardiance.   GDM: never DKA: never Severe hypoglycemia: never.  Pancreatitis: never.  Other: she is pursuing weight-loss surgery; she started  V-GO-20 pump in early 2017; she stopped using her continuous glucose monitor.    Interval history:   She takes these clicks: breakfast: 1, lunch: 1 and supper: 4 (1 click is 2 units).  no cbg record, but states cbg's vary from 84-180.  It is in general higher as the day goes on.  pt states she feels well in general.  She reports mild hypoglycemia after breakfast Past Medical History:  Diagnosis Date  . Anemia   . Diabetes mellitus without complication ([place])   . Fibroids   . Glaucoma   . H/O: obesity   . Hypertension   . Varicose vein     Past Surgical History:  Procedure Laterality Date  . [place] GLAND CYST EXCISION  2010  . CESAREAN SECTION  [id number]  . DILATION AND CURETTAGE OF UTERUS  1989  . TUBAL LIGATION  1996   bilateral    Social History   Socioeconomic History  . Marital status: Married    Spouse name: Madelaine Etienne  . Number of children: Not on file  . Years of education: Not on file  . Highest education level: Not on file  Occupational History  . Not on file  Social Needs  . Financial resource strain: Not on file  . Food insecurity    Worry: Not on file    Inability: Not on file  . Transportation needs    Medical: Not on file    Non-medical: Not on file  Tobacco Use  . Smoking status: Never Smoker  . Smokeless tobacco: Never Used  Substance and Sexual Activity  . Alcohol use: No    Alcohol/week: 0.0 standard drinks  . Drug use: No  . Sexual activity: Yes    Partners: Male    Birth control/protection: Post-menopausal    Comment: 1st  intercourse 35 yo-5 partners  Lifestyle  . Physical activity    Days per week: Not on file    Minutes per session: Not on file  . Stress: Not on file  Relationships  . Social Herbalist on phone: Not on file    Gets together: Not on file    Attends religious service: Not on file    Active member of club or organization: Not on file    Attends meetings of clubs or organizations: Not on file    Relationship status: Not on file  . Intimate partner violence    Fear of current or ex partner: Not on file    Emotionally abused: Not on file    Physically abused: Not on file    Forced sexual activity: Not on file  Other Topics Concern  . Not on file  Social History Narrative   Exercise walk 2-3 times for 30-40 minutes    Current Outpatient Medications on File Prior to Visit  Medication Sig Dispense Refill  . aspirin 81 MG tablet Take 81 mg by mouth daily.    Marland Kitchen BYSTOLIC 20 MG TABS Take 0.5 tablets (10 mg total) by mouth daily. 45 tablet 2  . carvedilol (COREG) 25 MG  tablet TAKE 1 TABLET DAILY 90 tablet 3  . Dulaglutide (TRULICITY) 1.5 CN/4.7SJ SOPN INJECT 1.5 MG UNDER THE SKIN ONCE A WEEK 6 mL 3  . empagliflozin (JARDIANCE) 25 MG TABS tablet Take 25 mg by mouth daily. 90 tablet 2  . estrogens, conjugated, (PREMARIN) 0.3 MG tablet Take 0.3 mg by mouth daily. Take daily for 21 days then do not take for 7 days.    . fluconazole (DIFLUCAN) 150 MG tablet Take one tablet daily as needed for yeast. 5 tablet 0  . fluticasone furoate-vilanterol (BREO ELLIPTA) 200-25 MCG/INH AEPB Inhale 1 puff into the lungs daily. 90 each 1  . hydrochlorothiazide (HYDRODIURIL) 25 MG tablet TAKE 1 TABLET DAILY 90 tablet 3  . HYDROcodone-acetaminophen (NORCO/VICODIN) 5-325 MG tablet hydrocodone 5 mg-acetaminophen 325 mg tablet    . ibuprofen (ADVIL,MOTRIN) 800 MG tablet ibuprofen 800 mg tablet    . Insulin Disposable Pump (V-GO 20) KIT USE AS DIRECTED 1 kit 5  . insulin regular (HUMULIN R) 100 units/mL  injection INJECT 90 UNITS SUBCUTANEOUSLY ONCE DAILY 30 mL 0  . medroxyPROGESTERone (PROVERA) 5 MG tablet Take 5 mg by mouth daily.    . metFORMIN (GLUCOPHAGE) 1000 MG tablet Take 1 tablet (1,000 mg total) by mouth at bedtime. 90 tablet 3  . metoprolol succinate (TOPROL-XL) 100 MG 24 hr tablet TAKE 1 TABLET DAILY WITH/OR IMMEDIATELY FOLLOWING A MEAL 90 tablet 1  . montelukast (SINGULAIR) 10 MG tablet TAKE 1 TABLET AT BEDTIME (OFFICE VISIT NEEDED FOR ADDITIONAL REFILLS) 90 tablet 3  . Multiple Vitamin (MULTIVITAMIN) tablet Take 1 tablet by mouth daily.    Marland Kitchen NYAMYC powder     . nystatin-triamcinolone (MYCOLOG II) cream     . ONETOUCH ULTRA test strip USE TO MONITOR YOUR GLUCOSE TWICE A DAY 300 each 3  . OSPHENA 60 MG TABS     . PROCTOSOL HC 2.5 % rectal cream     . telmisartan (MICARDIS) 80 MG tablet TAKE 1 TABLET DAILY 90 tablet 1  . zolpidem (AMBIEN) 10 MG tablet Take 1 tablet (10 mg total) by mouth at bedtime as needed for sleep. 90 tablet 3  . Continuous Blood Gluc Receiver (FREESTYLE LIBRE READER) DEVI USE AS DIRECTED ONCE (Patient not taking: Reported on 03/09/2019) 6 Device 3  . Continuous Blood Gluc Sensor (FREESTYLE LIBRE SENSOR SYSTEM) MISC 1 Device by Does not apply route once a week. (Patient not taking: Reported on 03/09/2019) 4 each 11   No current facility-administered medications on file prior to visit.     Allergies  Allergen Reactions  . Ace Inhibitors Cough    Family History  Problem Relation Age of Onset  . Cancer Father        colon  . Cancer Mother        per patient stomach  . Diabetes Maternal Grandmother   . Mental illness Sister   . Heart disease Brother     BP 122/60 (BP Location: Right Arm, Patient Position: Sitting, Cuff Size: Large)   Pulse 73   Ht '5\' 4"'  (1.626 m)   Wt 191 lb 6.4 oz (86.8 kg)   LMP 01/19/2011   SpO2 97%   BMI 32.85 kg/m    Review of Systems Denies LOC    Objective:   Physical Exam VITAL SIGNS:  See vs page GENERAL: no  distress Pulses: dorsalis pedis intact bilat.   MSK: no deformity of the feet CV: no leg edema Skin:  no ulcer on the feet.  normal color  and temp on the feet. Neuro: sensation is intact to touch on the feet  Lab Results  Component Value Date   HGBA1C 5.8 (A) 03/09/2019   Lab Results  Component Value Date   CREATININE 0.89 12/30/2017   BUN 12 12/30/2017   NA 143 12/30/2017   K 4.4 12/30/2017   CL 104 12/30/2017   CO2 25 12/30/2017        Assessment & Plan:  Insulin-requiring type 2 DM: overcontrolled Hypoglycemia, new: this limits aggressiveness of glycemic control   Patient Instructions  check your blood sugar twice a day.  vary the time of day when you check, between before the 3 meals, and at bedtime.  also check if you have symptoms of your blood sugar being too high or too low.  please keep a record of the readings and bring it to your next appointment here.  You can write it on any piece of paper.  please call us sooner if your blood sugar goes below 70, or if you have a lot of readings over 200.   I have sent a prescription to your pharmacy, to change the Trulicity to the once a day by mouth. Please continue the same clicks: breakfast: no clicks; lunch: 1 click; and supper:4 clicks.    Also, you should eat breakfast when you wake up.   Please continue the same other diabetes medications.    Please come back for a follow-up appointment in 4 months

## 2019-03-11 ENCOUNTER — Other Ambulatory Visit: Payer: Self-pay | Admitting: Endocrinology

## 2019-03-31 ENCOUNTER — Other Ambulatory Visit: Payer: Self-pay

## 2019-03-31 ENCOUNTER — Encounter: Payer: Self-pay | Admitting: Family Medicine

## 2019-03-31 ENCOUNTER — Ambulatory Visit (INDEPENDENT_AMBULATORY_CARE_PROVIDER_SITE_OTHER): Payer: POS | Admitting: Family Medicine

## 2019-03-31 VITALS — BP 124/74 | HR 75 | Temp 98.4°F | Ht 64.0 in | Wt 191.6 lb

## 2019-03-31 DIAGNOSIS — Z0000 Encounter for general adult medical examination without abnormal findings: Secondary | ICD-10-CM

## 2019-03-31 DIAGNOSIS — E1139 Type 2 diabetes mellitus with other diabetic ophthalmic complication: Secondary | ICD-10-CM | POA: Diagnosis not present

## 2019-03-31 DIAGNOSIS — Z23 Encounter for immunization: Secondary | ICD-10-CM

## 2019-03-31 DIAGNOSIS — Z794 Long term (current) use of insulin: Secondary | ICD-10-CM

## 2019-03-31 DIAGNOSIS — Z0001 Encounter for general adult medical examination with abnormal findings: Secondary | ICD-10-CM

## 2019-03-31 NOTE — Patient Instructions (Signed)
° ° ° °  If you have lab work done today you will be contacted with your lab results within the next 2 weeks.  If you have not heard from us then please contact us. The fastest way to get your results is to register for My Chart. ° ° °IF you received an x-ray today, you will receive an invoice from [facility]. Please contact [facility] at [phone] with questions or concerns regarding your invoice.  ° °IF you received labwork today, you will receive an invoice from [facility]. Please contact [facility] at [phone] with questions or concerns regarding your invoice.  ° °Our billing staff will not be able to assist you with questions regarding bills from these companies. ° °You will be contacted with the lab results as soon as they are available. The fastest way to get your results is to activate your My Chart account. Instructions are located on the last page of this paperwork. If you have not heard from us regarding the results in 2 weeks, please contact this office. °  ° ° ° °

## 2019-03-31 NOTE — Progress Notes (Signed)
Chief Complaint  Patient presents with  . Annual Exam    CPE  . mole on back    Subjective:  Heidi Howell is a 63 y.o. female here for a health maintenance visit.  Patient is established pt    Patient Active Problem List   Diagnosis Date Noted  . Heel pain, bilateral 11/19/2017  . Chronic right shoulder pain 09/23/2017  . Long-term use of aspirin therapy 09/23/2017  . Type 2 diabetes mellitus without complication, with long-term current use of insulin ([place]) 07/14/2015  . Essential hypertension 09/23/2014  . Adverse drug effect 02/12/2014  . Cortical cataract 08/17/2013  . Primary open angle glaucoma 08/17/2013  . No diabetic retinopathy in both eyes 08/17/2013  . Hyperopia 08/17/2013  . Astigmatism 08/17/2013  . Presbyopia 08/17/2013  . Obesity 08/20/2011  . Symptomatic menopausal or female climacteric states 08/20/2011  . Insomnia 08/20/2011  . Fibroids 08/20/2011    Past Medical History:  Diagnosis Date  . Anemia   . Diabetes mellitus without complication ([place])   . Fibroids   . Glaucoma   . H/O: obesity   . Hypertension   . Varicose vein     Past Surgical History:  Procedure Laterality Date  . [place] GLAND CYST EXCISION  2010  . CESAREAN SECTION  [id number]  . DILATION AND CURETTAGE OF UTERUS  1989  . TUBAL LIGATION  1996   bilateral     Outpatient Medications Prior to Visit  Medication Sig Dispense Refill  . aspirin 81 MG tablet Take 81 mg by mouth daily.    Marland Kitchen BYSTOLIC 20 MG TABS Take 0.5 tablets (10 mg total) by mouth daily. 45 tablet 2  . carvedilol (COREG) 25 MG tablet TAKE 1 TABLET DAILY 90 tablet 3  . empagliflozin (JARDIANCE) 25 MG TABS tablet Take 25 mg by mouth daily. 90 tablet 2  . estrogens, conjugated, (PREMARIN) 0.3 MG tablet Take 0.3 mg by mouth daily. Take daily for 21 days then do not take for 7 days.    . fluconazole (DIFLUCAN) 150 MG tablet Take one tablet daily as needed for yeast. 5 tablet 0  . fluticasone furoate-vilanterol  (BREO ELLIPTA) 200-25 MCG/INH AEPB Inhale 1 puff into the lungs daily. 90 each 1  . hydrochlorothiazide (HYDRODIURIL) 25 MG tablet TAKE 1 TABLET DAILY 90 tablet 3  . Insulin Disposable Pump (V-GO 20) KIT USE AS DIRECTED 1 kit 5  . insulin regular (HUMULIN R) 100 units/mL injection INJECT 90 UNITS SUBCUTANEOUSLY ONCE DAILY 30 mL 0  . medroxyPROGESTERone (PROVERA) 5 MG tablet Take 5 mg by mouth daily.    . metFORMIN (GLUCOPHAGE) 1000 MG tablet Take 1 tablet (1,000 mg total) by mouth at bedtime. 90 tablet 3  . montelukast (SINGULAIR) 10 MG tablet TAKE 1 TABLET AT BEDTIME (OFFICE VISIT NEEDED FOR ADDITIONAL REFILLS) 90 tablet 3  . Multiple Vitamin (MULTIVITAMIN) tablet Take 1 tablet by mouth daily.    Marland Kitchen NYAMYC powder     . nystatin-triamcinolone (MYCOLOG II) cream     . ONETOUCH ULTRA test strip USE TO MONITOR YOUR GLUCOSE TWICE A DAY 300 each 3  . PROCTOSOL HC 2.5 % rectal cream     . Semaglutide 14 MG TABS Take 14 mg by mouth daily. (Patient taking differently: Take 14 mg by mouth daily. RYBELSUS) 90 tablet 3  . telmisartan (MICARDIS) 80 MG tablet TAKE 1 TABLET DAILY 90 tablet 1  . zolpidem (AMBIEN) 10 MG tablet Take 1 tablet (10 mg total) by mouth at  bedtime as needed for sleep. 90 tablet 3  . Continuous Blood Gluc Receiver (FREESTYLE LIBRE READER) DEVI USE AS DIRECTED ONCE (Patient not taking: Reported on 03/09/2019) 6 Device 3  . Continuous Blood Gluc Sensor (FREESTYLE LIBRE SENSOR SYSTEM) MISC 1 Device by Does not apply route once a week. (Patient not taking: Reported on 03/09/2019) 4 each 11  . Dulaglutide (TRULICITY) 1.5 TD/1.7OH SOPN INJECT 1.5 MG UNDER THE SKIN ONCE A WEEK (Patient not taking: Reported on 03/31/2019) 6 mL 3  . metoprolol succinate (TOPROL-XL) 100 MG 24 hr tablet TAKE 1 TABLET DAILY WITH/OR IMMEDIATELY FOLLOWING A MEAL (Patient not taking: Reported on 03/31/2019) 90 tablet 1  . OSPHENA 60 MG TABS     . HYDROcodone-acetaminophen (NORCO/VICODIN) 5-325 MG tablet hydrocodone 5  mg-acetaminophen 325 mg tablet    . ibuprofen (ADVIL,MOTRIN) 800 MG tablet ibuprofen 800 mg tablet     No facility-administered medications prior to visit.     Allergies  Allergen Reactions  . Ace Inhibitors Cough     Family History  Problem Relation Age of Onset  . Cancer Father        colon  . Cancer Mother        per patient stomach  . Diabetes Maternal Grandmother   . Mental illness Sister   . Heart disease Brother      Health Habits: Dental Exam: up to date Eye Exam: up to date Exercise: 0 times/week on average Current exercise activities: walking/running Diet: balanced, diabetic   Social History   Socioeconomic History  . Marital status: Married    Spouse name: Madelaine Etienne  . Number of children: Not on file  . Years of education: Not on file  . Highest education level: Not on file  Occupational History  . Not on file  Social Needs  . Financial resource strain: Not on file  . Food insecurity    Worry: Not on file    Inability: Not on file  . Transportation needs    Medical: Not on file    Non-medical: Not on file  Tobacco Use  . Smoking status: Never Smoker  . Smokeless tobacco: Never Used  Substance and Sexual Activity  . Alcohol use: No    Alcohol/week: 0.0 standard drinks  . Drug use: No  . Sexual activity: Yes    Partners: Male    Birth control/protection: Post-menopausal    Comment: 1st intercourse 67 yo-5 partners  Lifestyle  . Physical activity    Days per week: Not on file    Minutes per session: Not on file  . Stress: Not on file  Relationships  . Social Herbalist on phone: Not on file    Gets together: Not on file    Attends religious service: Not on file    Active member of club or organization: Not on file    Attends meetings of clubs or organizations: Not on file    Relationship status: Not on file  . Intimate partner violence    Fear of current or ex partner: Not on file    Emotionally abused: Not on file     Physically abused: Not on file    Forced sexual activity: Not on file  Other Topics Concern  . Not on file  Social History Narrative   Exercise walk 2-3 times for 30-40 minutes   Social History   Substance and Sexual Activity  Alcohol Use No  . Alcohol/week: 0.0 standard drinks   Social  History   Tobacco Use  Smoking Status Never Smoker  Smokeless Tobacco Never Used   Social History   Substance and Sexual Activity  Drug Use No    GYN: Sexual Health Menstrual status: regular menses LMP: Patient's last menstrual period was 01/19/2011. Last pap smear: see HM section, Sees Gyne History of abnormal pap smears: Sexually active: with female partner Current contraception: n/a  Health Maintenance: See under health Maintenance activity for review of completion dates as well. Immunization History  Administered Date(s) Administered  . Influenza, Seasonal, Injecte, Preservative Fre 05/29/2012  . Influenza,inj,Quad PF,6+ Mos 02/26/2013, 01/14/2014, 04/14/2015, 02/21/2017, 04/28/2018  . Pneumococcal Conjugate-13 02/22/2014  . Pneumococcal Polysaccharide-23 02/21/2017  . Pneumococcal-Unspecified 12/30/2008, 05/07/2010  . Tdap 11/20/2016      Depression Screen-PHQ2/9 Depression screen [facility] 2/9 03/31/2019 12/23/2018 07/30/2018 04/28/2018 12/30/2017  Decreased Interest 0 0 0 0 0  Down, Depressed, Hopeless 0 0 0 0 0  PHQ - 2 Score 0 0 0 0 0       Depression Severity and Treatment Recommendations:  0-4= None  5-9= Mild / Treatment: Support, educate to call if worse; return in one month  10-14= Moderate / Treatment: Support, watchful waiting; Antidepressant or Psycotherapy  15-19= Moderately severe / Treatment: Antidepressant OR Psychotherapy  >= 20 = Major depression, severe / Antidepressant AND Psychotherapy    Review of Systems   ROS  See HPI for ROS as well.   Review of Systems  Constitutional: Negative for activity change, appetite change, chills and fever.  HENT:  Negative for congestion, nosebleeds, trouble swallowing and voice change.   Respiratory: Negative for cough, shortness of breath and wheezing.   Gastrointestinal: Negative for diarrhea, nausea and vomiting.  Genitourinary: Negative for difficulty urinating, dysuria, flank pain and hematuria.  Musculoskeletal: Negative for back pain, joint swelling and neck pain.  Neurological: Negative for dizziness, speech difficulty, light-headedness and numbness.  See HPI. All other review of systems negative.   Objective:   Vitals:   03/31/19 0814  BP: 124/74  Pulse: 75  Temp: 98.4 F (36.9 C)  TempSrc: Oral  SpO2: 97%  Weight: 191 lb 9.6 oz (86.9 kg)  Height: _0  (1.626 m)   Wt Readings from Last 3 Encounters:  03/31/19 191 lb 9.6 oz (86.9 kg)  03/09/19 191 lb 6.4 oz (86.8 kg)  12/23/18 192 lb 12.8 oz (87.5 kg)    Body mass index is 32.89 kg/m.  Physical Exam Constitutional:      Appearance: Normal appearance. She is normal weight.  HENT:     Head: Normocephalic and atraumatic.     Nose: Nose normal.  Eyes:     Extraocular Movements: Extraocular movements intact.     Conjunctiva/sclera: Conjunctivae normal.  Cardiovascular:     Rate and Rhythm: Normal rate and regular rhythm.     Pulses: Normal pulses.     Heart sounds: Normal heart sounds.  Pulmonary:     Effort: Pulmonary effort is normal. No respiratory distress.     Breath sounds: Normal breath sounds. No stridor. No wheezing, rhonchi or rales.  Chest:     Chest wall: No tenderness.  Abdominal:     General: Abdomen is flat. Bowel sounds are normal. There is no distension.     Palpations: Abdomen is soft. There is no mass.     Tenderness: There is no abdominal tenderness. There is no right CVA tenderness, left CVA tenderness, guarding or rebound.     Hernia: No hernia is present.  Skin:  General: Skin is warm.     Capillary Refill: Capillary refill takes less than 2 seconds.  Neurological:     General: No focal  deficit present.     Mental Status: She is alert and oriented to person, place, and time.  Psychiatric:        Mood and Affect: Mood normal.        Behavior: Behavior normal.        Thought Content: Thought content normal.        Judgment: Judgment normal.        Assessment/Plan:   Patient was seen for a health maintenance exam.  Counseled the patient on health maintenance issues. Reviewed her health mainteance schedule and ordered appropriate tests (see orders.) Counseled on regular exercise and weight management. Recommend regular eye exams and dental cleaning.   The following issues were addressed today for health maintenance:   Ayat was seen today for annual exam and mole on back.  Diagnoses and all orders for this visit:  Health maintenance examination - Women's Health Maintenance Plan Advised monthly breast exam and annual mammogram Advised dental exam every six months Discussed stress management   Type 2 diabetes mellitus with other ophthalmic complication, with long-term current use of insulin ([place]) - stable, much improved, continue healthy eating and Endocrinology recs -     Lipid panel -     CBC -     CMP14+EGFR  Need for prophylactic vaccination and inoculation against influenza -     Influenza (Seasonal)    Return in about 6 months (around 09/28/2019) for diabetes check.    Body mass index is 32.89 kg/m.:  Discussed the patient's BMI with patient. The BMI body mass index is 32.89 kg/m.     Future Appointments  Date Time Provider [place]  07/07/2019  8:30 AM Renato Shin, MD [facility] None    Patient Instructions       If you have lab work done today you will be contacted with your lab results within the next 2 weeks.  If you have not heard from [place] then please contact us. The fastest way to get your results is to register for My Chart.   IF you received an x-ray today, you will receive an invoice from [facility]. Please  contact [facility] at [phone] with questions or concerns regarding your invoice.   IF you received labwork today, you will receive an invoice from [facility]. Please contact [facility] at [phone] with questions or concerns regarding your invoice.   Our billing staff will not be able to assist you with questions regarding bills from these companies.  You will be contacted with the lab results as soon as they are available. The fastest way to get your results is to activate your My Chart account. Instructions are located on the last page of this paperwork. If you have not heard from [place] regarding the results in 2 weeks, please contact this office.

## 2019-04-01 ENCOUNTER — Other Ambulatory Visit: Payer: Self-pay | Admitting: Endocrinology

## 2019-04-01 LAB — CMP14+EGFR
ALT: 20 IU/L (ref 0–32)
AST: 13 IU/L (ref 0–40)
Albumin/Globulin Ratio: 1.5 (ref 1.2–2.2)
Albumin: 4.1 g/dL (ref 3.8–4.8)
Alkaline Phosphatase: 99 IU/L (ref 39–117)
BUN/Creatinine Ratio: 14 (ref 12–28)
BUN: 11 mg/dL (ref 8–27)
Bilirubin Total: 0.4 mg/dL (ref 0.0–1.2)
CO2: 23 mmol/L (ref 20–29)
Calcium: 9.5 mg/dL (ref 8.7–10.3)
Chloride: 103 mmol/L (ref 96–106)
Creatinine, Ser: 0.81 mg/dL (ref 0.57–1.00)
GFR calc Af Amer: 91 mL/min/{1.73_m2} (ref >59)
GFR calc non Af Amer: 79 mL/min/{1.73_m2} (ref >59)
Globulin, Total: 2.8 g/dL (ref 1.5–4.5)
Glucose: 123 mg/dL — ABNORMAL HIGH (ref 65–99)
Potassium: 3.8 mmol/L (ref 3.5–5.2)
Sodium: 140 mmol/L (ref 134–144)
Total Protein: 6.9 g/dL (ref 6.0–8.5)

## 2019-04-01 LAB — CBC
Hematocrit: 39.9 % (ref 34.0–46.6)
Hemoglobin: 12.9 g/dL (ref 11.1–15.9)
MCH: 26.9 pg (ref 26.6–33.0)
MCHC: 32.3 g/dL (ref 31.5–35.7)
MCV: 83 fL (ref 79–97)
Platelets: 289 10*3/uL (ref 150–450)
RBC: 4.8 x10E6/uL (ref 3.77–5.28)
RDW: 12.7 % (ref 11.7–15.4)
WBC: 6.7 10*3/uL (ref 3.4–10.8)

## 2019-04-01 LAB — LIPID PANEL
Chol/HDL Ratio: 2.9 ratio (ref 0.0–4.4)
Cholesterol, Total: 154 mg/dL (ref 100–199)
HDL: 53 mg/dL (ref >39)
LDL Chol Calc (NIH): 83 mg/dL (ref 0–99)
Triglycerides: 97 mg/dL (ref 0–149)
VLDL Cholesterol Cal: 18 mg/dL (ref 5–40)

## 2019-05-18 ENCOUNTER — Other Ambulatory Visit: Payer: Self-pay | Admitting: Endocrinology

## 2019-05-28 ENCOUNTER — Telehealth: Payer: Self-pay | Admitting: Family Medicine

## 2019-05-28 DIAGNOSIS — F5101 Primary insomnia: Secondary | ICD-10-CM

## 2019-05-28 NOTE — Telephone Encounter (Signed)
Patient is needing a new prescriptoion for her zolpidem (AMBIEN) 10 MG tablet [id number]  Please advise FR

## 2019-05-29 ENCOUNTER — Telehealth: Payer: Self-pay | Admitting: Family Medicine

## 2019-05-29 NOTE — Telephone Encounter (Signed)
[name] from [facility] called to ask about reccomending Pt for a statin/ please advise

## 2019-05-29 NOTE — Telephone Encounter (Signed)
Please Advise. Pt requesting a RX for amien.

## 2019-06-02 NOTE — Telephone Encounter (Signed)
Please advise 

## 2019-06-04 NOTE — Telephone Encounter (Signed)
Pt called back regarding request for Ambien refill. Stated she originally requested on 05/27/18 and has not gotten any response. Office closed for lunch. Please contact pt.

## 2019-06-04 NOTE — Telephone Encounter (Signed)
error 

## 2019-06-05 NOTE — Telephone Encounter (Signed)
Pt is calling checking on status of this message concerning zolpidem (AMBIEN) 10 MG tablet [id number]. Pharmacy  [place], [place]  [address] [place] [address]  Phone:  [phone] Fax:  [phone]    Please advise at [phone]

## 2019-06-05 NOTE — Telephone Encounter (Signed)
Patient is requesting a refill of the following medications: Ambien Requested Prescriptions    No prescriptions requested or ordered in this encounter    Date of patient request: 05/28/2019 Last office visit: 03/31/2019 Date of last refill: 12/23/2018 Last refill amount: 90 tablet 1 refill Follow up time period per chart: Future appointment scheduled.

## 2019-06-08 ENCOUNTER — Telehealth: Payer: Self-pay | Admitting: Family Medicine

## 2019-06-08 NOTE — Telephone Encounter (Signed)
What is the name of the medication? zolpidem (AMBIEN) 10 MG tablet   Have you contacted your pharmacy to request a refill? Yes , 3rd call to request it   Which pharmacy would you like this sent to?  [facility], [place]   Patient notified that their request is being sent to the clinical staff for review and that they should receive a call once it is complete. If they do not receive a call within 72 hours they can check with their pharmacy or our office.

## 2019-06-08 NOTE — Telephone Encounter (Signed)
Please advise last seen 03/31/19

## 2019-06-10 ENCOUNTER — Other Ambulatory Visit: Payer: Self-pay | Admitting: Family Medicine

## 2019-06-10 DIAGNOSIS — F5101 Primary insomnia: Secondary | ICD-10-CM

## 2019-06-10 NOTE — Telephone Encounter (Signed)
Medication Refill - Medication: zolpidem (AMBIEN) 10 MG tablet [id number]      Preferred Pharmacy (with phone number or street name):  [place], [place]  [address] [place] [address]  Phone: [phone] Fax: [phone]     Agent: Please be advised that RX refills may take up to 3 business days. We ask that you follow-up with your pharmacy.

## 2019-06-10 NOTE — Telephone Encounter (Signed)
Requested medication (s) are due for refill today - yes  Requested medication (s) are on the active medication list -yes  Future visit scheduled -yes  Last refill: 12/23/18  Notes to clinic: Patient is requesting refill of non delegated Rx  Requested Prescriptions  Pending Prescriptions Disp Refills   zolpidem (AMBIEN) 10 MG tablet 90 tablet 3    Sig: Take 1 tablet (10 mg total) by mouth at bedtime as needed for sleep.      Not Delegated - Psychiatry:  Anxiolytics/Hypnotics Failed - 06/10/2019  4:24 PM      Failed - This refill cannot be delegated      Failed - Urine Drug Screen completed in last 360 days.      Passed - Valid encounter within last 6 months    Recent Outpatient Visits           2 months ago Health maintenance examination   Primary Care at [facility], [place] A, MD   5 months ago Type 2 diabetes mellitus with other ophthalmic complication, with long-term current use of insulin ([place])   Primary Care at [facility], [place] A, MD   10 months ago Type 2 diabetes mellitus with other ophthalmic complication, with long-term current use of insulin ([place])   Primary Care at [facility], Arlie Solomons, MD   1 year ago Essential hypertension   Primary Care at [facility], [place] A, MD   1 year ago Type 2 diabetes mellitus without complication, with long-term current use of insulin [facility])   Primary Care at [place], MD       Future Appointments             In 3 months Forrest Moron, MD Primary Care at [place], [facility]                Requested Prescriptions  Pending Prescriptions Disp Refills   zolpidem (AMBIEN) 10 MG tablet 90 tablet 3    Sig: Take 1 tablet (10 mg total) by mouth at bedtime as needed for sleep.      Not Delegated - Psychiatry:  Anxiolytics/Hypnotics Failed - 06/10/2019  4:24 PM      Failed - This refill cannot be delegated      Failed - Urine Drug Screen completed in last 360 days.      Passed - Valid encounter within last 6  months    Recent Outpatient Visits           2 months ago Health maintenance examination   Primary Care at [facility], [place] A, MD   5 months ago Type 2 diabetes mellitus with other ophthalmic complication, with long-term current use of insulin ([place])   Primary Care at [facility], [place] A, MD   10 months ago Type 2 diabetes mellitus with other ophthalmic complication, with long-term current use of insulin [facility])   Primary Care at [facility], Arlie Solomons, MD   1 year ago Essential hypertension   Primary Care at [facility], [place] A, MD   1 year ago Type 2 diabetes mellitus without complication, with long-term current use of insulin [facility])   Primary Care at [place], MD       Future Appointments             In 3 months Forrest Moron, MD Primary Care at [place], [facility]

## 2019-06-11 MED ORDER — ZOLPIDEM TARTRATE 10 MG PO TABS: 10.0000 mg | ORAL_TABLET | Freq: Every evening | ORAL | 3 refills | Status: DC | PRN

## 2019-06-11 NOTE — Telephone Encounter (Signed)
Refill has been sent to mail order

## 2019-06-11 NOTE — Addendum Note (Signed)
Addended by: Delia Chimes A on: 06/11/2019 03:32 PM   Modules accepted: Orders

## 2019-06-25 ENCOUNTER — Ambulatory Visit: Payer: Self-pay

## 2019-06-29 ENCOUNTER — Ambulatory Visit: Payer: 59 | Attending: Family

## 2019-06-29 DIAGNOSIS — Z23 Encounter for immunization: Secondary | ICD-10-CM | POA: Insufficient documentation

## 2019-06-29 NOTE — Progress Notes (Signed)
   Covid-19 Vaccination Clinic  Name:  Heidi Howell    MRN: [id number] DOB: 07-16-1955  06/29/2019  [name] was observed post Covid-19 immunization for 15 minutes without incidence. She was provided with Vaccine Information Sheet and instruction to access the V-Safe system.   [name] was instructed to call 911 with any severe reactions post vaccine: Marland Kitchen Difficulty breathing  . Swelling of your face and throat  . A fast heartbeat  . A bad rash all over your body  . Dizziness and weakness    Immunizations Administered    Name Date Dose VIS Date Route   Moderna COVID-19 Vaccine 06/29/2019  2:17 PM 0.5 mL 04/07/2019 Intramuscular   Manufacturer: Moderna   Lot: [id number]   [id number]

## 2019-07-07 ENCOUNTER — Other Ambulatory Visit: Payer: Self-pay

## 2019-07-07 ENCOUNTER — Ambulatory Visit: Payer: 59 | Admitting: Endocrinology

## 2019-07-07 ENCOUNTER — Encounter: Payer: Self-pay | Admitting: Endocrinology

## 2019-07-07 ENCOUNTER — Telehealth: Payer: Self-pay | Admitting: Family Medicine

## 2019-07-07 VITALS — BP 126/80 | HR 73 | Ht 64.0 in | Wt 190.2 lb

## 2019-07-07 DIAGNOSIS — Z794 Long term (current) use of insulin: Secondary | ICD-10-CM | POA: Diagnosis not present

## 2019-07-07 DIAGNOSIS — E1165 Type 2 diabetes mellitus with hyperglycemia: Secondary | ICD-10-CM

## 2019-07-07 DIAGNOSIS — E119 Type 2 diabetes mellitus without complications: Secondary | ICD-10-CM

## 2019-07-07 LAB — POCT GLYCOSYLATED HEMOGLOBIN (HGB A1C): Hemoglobin A1C: 6.1 % — AB (ref 4.0–5.6)

## 2019-07-07 MED ORDER — REPAGLINIDE 2 MG PO TABS: ORAL_TABLET | ORAL | 3 refills | Status: DC

## 2019-07-07 NOTE — Progress Notes (Signed)
Subjective:    Patient ID: Heidi Howell, female    DOB: [birth year], 64 y.o.   MRN: [id number]  HPI Pt returns for f/u of diabetes mellitus: DM type: Insulin-requiring type 2. Dx'ed: 2007.  Complications: none.  Therapy: insulin since 2014, and 3 oral meds.   GDM: never DKA: never Severe hypoglycemia: never.  Pancreatitis: never.  Other: she is pursuing weight-loss surgery; she started  V-GO-20 pump in early 2017; she stopped using her continuous glucose monitor.    Interval history:   She takes these clicks: breakfast: 1, lunch: 1 and supper: 4 (1 click is 2 units).  no cbg record, but states cbg's vary from 70-140.  It is in general higher as the day goes on.  pt states she feels well in general.  She reports mild hypoglycemia after breakfast.  Past Medical History:  Diagnosis Date  . Anemia   . Diabetes mellitus without complication ([place])   . Fibroids   . Glaucoma   . H/O: obesity   . Hypertension   . Varicose vein     Past Surgical History:  Procedure Laterality Date  . [place] GLAND CYST EXCISION  2010  . CESAREAN SECTION  [id number]  . DILATION AND CURETTAGE OF UTERUS  1989  . TUBAL LIGATION  1996   bilateral    Social History   Socioeconomic History  . Marital status: Married    Spouse name: Madelaine Etienne  . Number of children: Not on file  . Years of education: Not on file  . Highest education level: Not on file  Occupational History  . Not on file  Tobacco Use  . Smoking status: Never Smoker  . Smokeless tobacco: Never Used  Substance and Sexual Activity  . Alcohol use: No    Alcohol/week: 0.0 standard drinks  . Drug use: No  . Sexual activity: Yes    Partners: Male    Birth control/protection: Post-menopausal    Comment: 1st intercourse 69 yo-5 partners  Other Topics Concern  . Not on file  Social History Narrative   Exercise walk 2-3 times for 30-40 minutes   Social Determinants of Health   Financial Resource Strain:   . Difficulty of  Paying Living Expenses: Not on file  Food Insecurity:   . Worried About Charity fundraiser in the Last Year: Not on file  . Ran Out of Food in the Last Year: Not on file  Transportation Needs:   . Lack of Transportation (Medical): Not on file  . Lack of Transportation (Non-Medical): Not on file  Physical Activity:   . Days of Exercise per Week: Not on file  . Minutes of Exercise per Session: Not on file  Stress:   . Feeling of Stress : Not on file  Social Connections:   . Frequency of Communication with Friends and Family: Not on file  . Frequency of Social Gatherings with Friends and Family: Not on file  . Attends Religious Services: Not on file  . Active Member of Clubs or Organizations: Not on file  . Attends Archivist Meetings: Not on file  . Marital Status: Not on file  Intimate Partner Violence:   . Fear of Current or Ex-Partner: Not on file  . Emotionally Abused: Not on file  . Physically Abused: Not on file  . Sexually Abused: Not on file    Current Outpatient Medications on File Prior to Visit  Medication Sig Dispense Refill  . aspirin 81 MG tablet Take  81 mg by mouth daily.    Marland Kitchen BYSTOLIC 20 MG TABS Take 0.5 tablets (10 mg total) by mouth daily. 45 tablet 2  . carvedilol (COREG) 25 MG tablet TAKE 1 TABLET DAILY 90 tablet 3  . empagliflozin (JARDIANCE) 25 MG TABS tablet Take 25 mg by mouth daily. 90 tablet 2  . estrogens, conjugated, (PREMARIN) 0.3 MG tablet Take 0.3 mg by mouth daily. Take daily for 21 days then do not take for 7 days.    . fluconazole (DIFLUCAN) 150 MG tablet Take one tablet daily as needed for yeast. 5 tablet 0  . fluticasone furoate-vilanterol (BREO ELLIPTA) 200-25 MCG/INH AEPB Inhale 1 puff into the lungs daily. 90 each 1  . hydrochlorothiazide (HYDRODIURIL) 25 MG tablet TAKE 1 TABLET DAILY 90 tablet 3  . medroxyPROGESTERone (PROVERA) 5 MG tablet Take 5 mg by mouth daily.    . metFORMIN (GLUCOPHAGE) 1000 MG tablet Take 1 tablet (1,000 mg  total) by mouth at bedtime. 90 tablet 3  . metoprolol succinate (TOPROL-XL) 100 MG 24 hr tablet TAKE 1 TABLET DAILY WITH/OR IMMEDIATELY FOLLOWING A MEAL 90 tablet 1  . montelukast (SINGULAIR) 10 MG tablet TAKE 1 TABLET AT BEDTIME (OFFICE VISIT NEEDED FOR ADDITIONAL REFILLS) 90 tablet 3  . Multiple Vitamin (MULTIVITAMIN) tablet Take 1 tablet by mouth daily.    Marland Kitchen NYAMYC powder     . nystatin-triamcinolone (MYCOLOG II) cream     . ONETOUCH ULTRA test strip USE TO MONITOR YOUR GLUCOSE TWICE A DAY 300 each 3  . OSPHENA 60 MG TABS     . PROCTOSOL HC 2.5 % rectal cream     . Semaglutide 14 MG TABS Take 14 mg by mouth daily. (Patient taking differently: Take 14 mg by mouth daily. RYBELSUS) 90 tablet 3  . telmisartan (MICARDIS) 80 MG tablet TAKE 1 TABLET DAILY 90 tablet 1  . zolpidem (AMBIEN) 10 MG tablet Take 1 tablet (10 mg total) by mouth at bedtime as needed for sleep. 90 tablet 3  . Continuous Blood Gluc Receiver (FREESTYLE LIBRE READER) DEVI USE AS DIRECTED ONCE (Patient not taking: Reported on 07/07/2019) 6 Device 3  . Continuous Blood Gluc Sensor (FREESTYLE LIBRE SENSOR SYSTEM) MISC 1 Device by Does not apply route once a week. (Patient not taking: Reported on 07/07/2019) 4 each 11   No current facility-administered medications on file prior to visit.    Allergies  Allergen Reactions  . Ace Inhibitors Cough    Family History  Problem Relation Age of Onset  . Cancer Father        colon  . Cancer Mother        per patient stomach  . Diabetes Maternal Grandmother   . Mental illness Sister   . Heart disease Brother     BP 126/80 (BP Location: Right Arm, Patient Position: Sitting, Cuff Size: Large)   Pulse 73   Ht 5\' 4"  (1.626 m)   Wt 190 lb 3.2 oz (86.3 kg)   LMP 01/19/2011   SpO2 97%   BMI 32.65 kg/m    Review of Systems     Objective:   Physical Exam VITAL SIGNS:  See vs page GENERAL: no distress Pulses: dorsalis pedis intact bilat.   MSK: no deformity of the feet CV: no  leg edema Skin:  no ulcer on the feet.  normal color and temp on the feet. Neuro: sensation is intact to touch on the feet  Lab Results  Component Value Date   HGBA1C 6.1 (  A) 07/07/2019   Lab Results  Component Value Date   CREATININE 0.81 03/31/2019   BUN 11 03/31/2019   NA 140 03/31/2019   K 3.8 03/31/2019   CL 103 03/31/2019   CO2 23 03/31/2019       Assessment & Plan:  Type 2 DM: overcontrolled: she can have a trial off insulin.  Hypoglycemia: this limits aggressiveness of glycemic control.   Patient Instructions  check your blood sugar twice a day.  vary the time of day when you check, between before the 3 meals, and at bedtime.  also check if you have symptoms of your blood sugar being too high or too low.  please keep a record of the readings and bring it to your next appointment here.  You can write it on any piece of paper.  please call us sooner if your blood sugar goes below 70, or if you have a lot of readings over 200.   I have sent a prescription to your pharmacy, to change the insulin to "repaglinide."    Please continue the same other diabetes medications.    Please come back for a follow-up appointment in 2 months.

## 2019-07-07 NOTE — Patient Instructions (Addendum)
check your blood sugar twice a day.  vary the time of day when you check, between before the 3 meals, and at bedtime.  also check if you have symptoms of your blood sugar being too high or too low.  please keep a record of the readings and bring it to your next appointment here.  You can write it on any piece of paper.  please call us sooner if your blood sugar goes below 70, or if you have a lot of readings over 200.   I have sent a prescription to your pharmacy, to change the insulin to "repaglinide."    Please continue the same other diabetes medications.    Please come back for a follow-up appointment in 2 months.

## 2019-07-07 NOTE — Telephone Encounter (Signed)
Pt dropped off FMLA forms to be filled by [employer]. Copies were made for patient. FMLA fee was collected. Pprwrk left in provider's box at nurses station. Pt has deadline of 07/19/19. She would like it faxed and available for pick up.

## 2019-07-08 NOTE — Telephone Encounter (Signed)
Form removed from provider box and given to provider for completion.

## 2019-07-14 ENCOUNTER — Telehealth: Payer: Self-pay

## 2019-07-14 NOTE — Telephone Encounter (Signed)
Per Lolita Patella pt calling today about FMLA paperwork, advised Yosseline to let pt know pt work completed and fax over to fax: [phone] and successful fax confirmation received. Copy made for scan and me.  Advised to let pt know original ready for p/u at front desk at her convenience.

## 2019-07-24 LAB — HM DIABETES EYE EXAM

## 2019-08-04 ENCOUNTER — Ambulatory Visit: Payer: 59 | Attending: Family

## 2019-08-04 DIAGNOSIS — Z23 Encounter for immunization: Secondary | ICD-10-CM

## 2019-08-04 NOTE — Progress Notes (Signed)
   Covid-19 Vaccination Clinic  Name:  SINEAD HLAVAC    MRN: [id number] DOB: Apr 27, 1956  08/04/2019  Ms. Lagunes was observed post Covid-19 immunization for 15 minutes without incident. She was provided with Vaccine Information Sheet and instruction to access the V-Safe system.   Ms. Arredondo was instructed to call 911 with any severe reactions post vaccine: Marland Kitchen Difficulty breathing  . Swelling of face and throat  . A fast heartbeat  . A bad rash all over body  . Dizziness and weakness   Immunizations Administered    Name Date Dose VIS Date Route   Moderna COVID-19 Vaccine 08/04/2019  2:05 PM 0.5 mL 04/07/2019 Intramuscular   Manufacturer: Moderna   Lot: [id number]   [id number]

## 2019-09-08 ENCOUNTER — Ambulatory Visit: Payer: 59 | Admitting: Endocrinology

## 2019-09-08 ENCOUNTER — Other Ambulatory Visit: Payer: Self-pay

## 2019-09-08 ENCOUNTER — Encounter: Payer: Self-pay | Admitting: Endocrinology

## 2019-09-08 VITALS — BP 110/70 | HR 77 | Ht 64.0 in | Wt 183.0 lb

## 2019-09-08 DIAGNOSIS — E119 Type 2 diabetes mellitus without complications: Secondary | ICD-10-CM | POA: Diagnosis not present

## 2019-09-08 DIAGNOSIS — E1165 Type 2 diabetes mellitus with hyperglycemia: Secondary | ICD-10-CM | POA: Diagnosis not present

## 2019-09-08 DIAGNOSIS — Z794 Long term (current) use of insulin: Secondary | ICD-10-CM

## 2019-09-08 LAB — POCT GLYCOSYLATED HEMOGLOBIN (HGB A1C): Hemoglobin A1C: 6.2 % — AB (ref 4.0–5.6)

## 2019-09-08 NOTE — Patient Instructions (Addendum)
check your blood sugar twice a day.  vary the time of day when you check, between before the 3 meals, and at bedtime.  also check if you have symptoms of your blood sugar being too high or too low.  please keep a record of the readings and bring it to your next appointment here.  You can write it on any piece of paper.  please call us sooner if your blood sugar goes below 70, or if you have a lot of readings over 200.     Please continue the same diabetes medications.    Please come back for a follow-up appointment in 4 months.

## 2019-09-08 NOTE — Progress Notes (Signed)
Subjective:    Patient ID: Heidi Howell, female    DOB: 09/06/1955, 64 y.o.   MRN: [id number]  HPI Pt returns for f/u of diabetes mellitus: DM type: 2 Dx'ed: 2007.  Complications: none.  Therapy: 4 oral meds.   GDM: never DKA: never Severe hypoglycemia: never.  Pancreatitis: never.  Other: she is pursuing weight-loss surgery; she started  V-GO-20 pump in early 2017; she stopped using her continuous glucose monitor; she took insulin 2014-2021 (V-GO pump 2017-2021) Interval history: no cbg record, but states cbg's vary from 70-180.  It is in general higher as the day goes on.   Past Medical History:  Diagnosis Date  . Anemia   . Diabetes mellitus without complication ([place])   . Fibroids   . Glaucoma   . H/O: obesity   . Hypertension   . Varicose vein     Past Surgical History:  Procedure Laterality Date  . [place] GLAND CYST EXCISION  2010  . CESAREAN SECTION  [id number]  . DILATION AND CURETTAGE OF UTERUS  1989  . TUBAL LIGATION  1996   bilateral    Social History   Socioeconomic History  . Marital status: Married    Spouse name: Madelaine Etienne  . Number of children: Not on file  . Years of education: Not on file  . Highest education level: Not on file  Occupational History  . Not on file  Tobacco Use  . Smoking status: Never Smoker  . Smokeless tobacco: Never Used  Substance and Sexual Activity  . Alcohol use: No    Alcohol/week: 0.0 standard drinks  . Drug use: No  . Sexual activity: Yes    Partners: Male    Birth control/protection: Post-menopausal    Comment: 1st intercourse 87 yo-5 partners  Other Topics Concern  . Not on file  Social History Narrative   Exercise walk 2-3 times for 30-40 minutes   Social Determinants of Health   Financial Resource Strain:   . Difficulty of Paying Living Expenses:   Food Insecurity:   . Worried About Charity fundraiser in the Last Year:   . Arboriculturist in the Last Year:   Transportation Needs:   . Consulting civil engineer (Medical):   Marland Kitchen Lack of Transportation (Non-Medical):   Physical Activity:   . Days of Exercise per Week:   . Minutes of Exercise per Session:   Stress:   . Feeling of Stress :   Social Connections:   . Frequency of Communication with Friends and Family:   . Frequency of Social Gatherings with Friends and Family:   . Attends Religious Services:   . Active Member of Clubs or Organizations:   . Attends Archivist Meetings:   Marland Kitchen Marital Status:   Intimate Partner Violence:   . Fear of Current or Ex-Partner:   . Emotionally Abused:   Marland Kitchen Physically Abused:   . Sexually Abused:     Current Outpatient Medications on File Prior to Visit  Medication Sig Dispense Refill  . aspirin 81 MG tablet Take 81 mg by mouth daily.    Marland Kitchen BYSTOLIC 20 MG TABS Take 0.5 tablets (10 mg total) by mouth daily. 45 tablet 2  . carvedilol (COREG) 25 MG tablet TAKE 1 TABLET DAILY 90 tablet 3  . Continuous Blood Gluc Receiver (FREESTYLE LIBRE READER) DEVI USE AS DIRECTED ONCE 6 Device 3  . Continuous Blood Gluc Sensor (FREESTYLE LIBRE SENSOR SYSTEM) MISC 1 Device by Does  not apply route once a week. 4 each 11  . empagliflozin (JARDIANCE) 25 MG TABS tablet Take 25 mg by mouth daily. 90 tablet 2  . estrogens, conjugated, (PREMARIN) 0.3 MG tablet Take 0.3 mg by mouth daily. Take daily for 21 days then do not take for 7 days.    . fluconazole (DIFLUCAN) 150 MG tablet Take one tablet daily as needed for yeast. 5 tablet 0  . fluticasone furoate-vilanterol (BREO ELLIPTA) 200-25 MCG/INH AEPB Inhale 1 puff into the lungs daily. 90 each 1  . hydrochlorothiazide (HYDRODIURIL) 25 MG tablet TAKE 1 TABLET DAILY 90 tablet 3  . medroxyPROGESTERone (PROVERA) 5 MG tablet Take 5 mg by mouth daily.    . metFORMIN (GLUCOPHAGE) 1000 MG tablet Take 1 tablet (1,000 mg total) by mouth at bedtime. 90 tablet 3  . metoprolol succinate (TOPROL-XL) 100 MG 24 hr tablet TAKE 1 TABLET DAILY WITH/OR IMMEDIATELY FOLLOWING A  MEAL 90 tablet 1  . montelukast (SINGULAIR) 10 MG tablet TAKE 1 TABLET AT BEDTIME (OFFICE VISIT NEEDED FOR ADDITIONAL REFILLS) 90 tablet 3  . Multiple Vitamin (MULTIVITAMIN) tablet Take 1 tablet by mouth daily.    Marland Kitchen NYAMYC powder     . nystatin-triamcinolone (MYCOLOG II) cream     . ONETOUCH ULTRA test strip USE TO MONITOR YOUR GLUCOSE TWICE A DAY 300 each 3  . OSPHENA 60 MG TABS     . PROCTOSOL HC 2.5 % rectal cream     . repaglinide (PRANDIN) 2 MG tablet 1 tab with breakfast, 1 with lunch, and 2 with supper 270 tablet 3  . Semaglutide 14 MG TABS Take 14 mg by mouth daily. (Patient taking differently: Take 14 mg by mouth daily. RYBELSUS) 90 tablet 3  . telmisartan (MICARDIS) 80 MG tablet TAKE 1 TABLET DAILY 90 tablet 1  . zolpidem (AMBIEN) 10 MG tablet Take 1 tablet (10 mg total) by mouth at bedtime as needed for sleep. 90 tablet 3   No current facility-administered medications on file prior to visit.    Allergies  Allergen Reactions  . Ace Inhibitors Cough    Family History  Problem Relation Age of Onset  . Cancer Father        colon  . Cancer Mother        per patient stomach  . Diabetes Maternal Grandmother   . Mental illness Sister   . Heart disease Brother     BP 110/70   Pulse 77   Ht 5\' 4"  (1.626 m)   Wt 183 lb (83 kg)   LMP 01/19/2011   SpO2 98%   BMI 31.41 kg/m    Review of Systems Denies LOC    Objective:   Physical Exam VITAL SIGNS:  See vs page GENERAL: no distress Pulses: dorsalis pedis intact bilat.   MSK: no deformity of the feet, except for bilat bunions.   CV: no leg edema Skin:  no ulcer on the feet.  normal color and temp on the feet. Neuro: sensation is intact to touch on the feet.  Lab Results  Component Value Date   HGBA1C 6.2 (A) 09/08/2019       Assessment & Plan:  type 2 DM: well-controlled.    Patient Instructions  check your blood sugar twice a day.  vary the time of day when you check, between before the 3 meals, and at  bedtime.  also check if you have symptoms of your blood sugar being too high or too low.  please keep a  record of the readings and bring it to your next appointment here.  You can write it on any piece of paper.  please call us sooner if your blood sugar goes below 70, or if you have a lot of readings over 200.     Please continue the same diabetes medications.    Please come back for a follow-up appointment in 4 months.

## 2019-09-14 ENCOUNTER — Ambulatory Visit: Payer: POS | Admitting: Family Medicine

## 2019-09-14 ENCOUNTER — Other Ambulatory Visit: Payer: Self-pay

## 2019-09-14 ENCOUNTER — Encounter: Payer: Self-pay | Admitting: Family Medicine

## 2019-09-14 VITALS — BP 128/73 | HR 76 | Temp 97.7°F | Ht 64.0 in | Wt 183.4 lb

## 2019-09-14 DIAGNOSIS — I10 Essential (primary) hypertension: Secondary | ICD-10-CM

## 2019-09-14 DIAGNOSIS — E1139 Type 2 diabetes mellitus with other diabetic ophthalmic complication: Secondary | ICD-10-CM

## 2019-09-14 DIAGNOSIS — L821 Other seborrheic keratosis: Secondary | ICD-10-CM

## 2019-09-14 DIAGNOSIS — E785 Hyperlipidemia, unspecified: Secondary | ICD-10-CM

## 2019-09-14 MED ORDER — BYSTOLIC 20 MG PO TABS: 10.0000 mg | ORAL_TABLET | Freq: Every day | ORAL | 2 refills | Status: DC

## 2019-09-14 MED ORDER — JARDIANCE 25 MG PO TABS: 25.0000 mg | ORAL_TABLET | Freq: Every day | ORAL | 2 refills | Status: DC

## 2019-09-14 MED ORDER — CARVEDILOL 25 MG PO TABS: 25.0000 mg | ORAL_TABLET | Freq: Every day | ORAL | 3 refills | Status: DC

## 2019-09-14 NOTE — Patient Instructions (Addendum)
WART Removal Call Dr. Nolon Rod for wart removal. Choose a time and date that allows you to have 3 days without a bra   [facility] - [phone] [email or web address]   If you have lab work done today you will be contacted with your lab results within the next 2 weeks.  If you have not heard from [place] then please contact us. The fastest way to get your results is to register for My Chart.   IF you received an x-ray today, you will receive an invoice from [facility]. Please contact [facility] at [phone] with questions or concerns regarding your invoice.   IF you received labwork today, you will receive an invoice from [place]. Please contact [facility] at [phone] with questions or concerns regarding your invoice.   Our billing staff will not be able to assist you with questions regarding bills from these companies.  You will be contacted with the lab results as soon as they are available. The fastest way to get your results is to activate your My Chart account. Instructions are located on the last page of this paperwork. If you have not heard from [place] regarding the results in 2 weeks, please contact this office.

## 2019-09-14 NOTE — Progress Notes (Unsigned)
Established Patient Office Visit  Subjective:  Patient ID: Heidi Howell, female    DOB: [birth year]  Age: 64 y.o. MRN: [id number]  CC:  Chief Complaint  Patient presents with  . Medical Management of Chronic Issues    diabetes f/u and labs. Patient also stated she need her light duty paper work updated  . Nevus    mole on the right under arm would like looked at  . Insomnia    would like to get more refills for sleeping pill. 90 day supply if possible    HPI [employer] presents for   Insomnia Patient states that she needs an incrase in the quantity of her sleeping medication. She reports that she is needing the [place] because she cannot get to sleep and feels very tired.  She states that without the [place] she might sleep 3 hours at the most. She states that she gets arm pain which wakes her up from her sleep and it is hard for her to go back to sleep. She gets up at 4am and has to be at work by [employer] and works from [employer] to [employer].  Wart Patient has a skin lesion at the spot under her right armpit at the bra line.   Past Medical History:  Diagnosis Date  . Anemia   . Diabetes mellitus without complication ([place])   . Fibroids   . Glaucoma   . H/O: obesity   . Hypertension   . Varicose vein     Past Surgical History:  Procedure Laterality Date  . [place] GLAND CYST EXCISION  2010  . CESAREAN SECTION  [id number]  . DILATION AND CURETTAGE OF UTERUS  1989  . TUBAL LIGATION  1996   bilateral    Family History  Problem Relation Age of Onset  . Cancer Father        colon  . Cancer Mother        per patient stomach  . Diabetes Maternal Grandmother   . Mental illness Sister   . Heart disease Brother     Social History   Socioeconomic History  . Marital status: Married    Spouse name: Madelaine Etienne  . Number of children: Not on file  . Years of education: Not on file  . Highest education level: Not on file  Occupational History  . Not on file  Tobacco Use    . Smoking status: Never Smoker  . Smokeless tobacco: Never Used  Substance and Sexual Activity  . Alcohol use: No    Alcohol/week: 0.0 standard drinks  . Drug use: No  . Sexual activity: Yes    Partners: Male    Birth control/protection: Post-menopausal    Comment: 1st intercourse 50 yo-5 partners  Other Topics Concern  . Not on file  Social History Narrative   Exercise walk 2-3 times for 30-40 minutes   Social Determinants of Health   Financial Resource Strain:   . Difficulty of Paying Living Expenses:   Food Insecurity:   . Worried About Charity fundraiser in the Last Year:   . Arboriculturist in the Last Year:   Transportation Needs:   . Film/video editor (Medical):   Marland Kitchen Lack of Transportation (Non-Medical):   Physical Activity:   . Days of Exercise per Week:   . Minutes of Exercise per Session:   Stress:   . Feeling of Stress :   Social Connections:   . Frequency of Communication with  Friends and Family:   . Frequency of Social Gatherings with Friends and Family:   . Attends Religious Services:   . Active Member of Clubs or Organizations:   . Attends Archivist Meetings:   Marland Kitchen Marital Status:   Intimate Partner Violence:   . Fear of Current or Ex-Partner:   . Emotionally Abused:   Marland Kitchen Physically Abused:   . Sexually Abused:     Outpatient Medications Prior to Visit  Medication Sig Dispense Refill  . aspirin 81 MG tablet Take 81 mg by mouth daily.    . Continuous Blood Gluc Receiver (FREESTYLE LIBRE READER) DEVI USE AS DIRECTED ONCE 6 Device 3  . Continuous Blood Gluc Sensor (FREESTYLE LIBRE SENSOR SYSTEM) MISC 1 Device by Does not apply route once a week. 4 each 11  . estrogens, conjugated, (PREMARIN) 0.3 MG tablet Take 0.3 mg by mouth daily. Take daily for 21 days then do not take for 7 days.    . fluconazole (DIFLUCAN) 150 MG tablet Take one tablet daily as needed for yeast. 5 tablet 0  . fluticasone furoate-vilanterol (BREO ELLIPTA) 200-25  MCG/INH AEPB Inhale 1 puff into the lungs daily. 90 each 1  . hydrochlorothiazide (HYDRODIURIL) 25 MG tablet TAKE 1 TABLET DAILY 90 tablet 3  . medroxyPROGESTERone (PROVERA) 5 MG tablet Take 5 mg by mouth daily.    . metFORMIN (GLUCOPHAGE) 1000 MG tablet Take 1 tablet (1,000 mg total) by mouth at bedtime. 90 tablet 3  . metoprolol succinate (TOPROL-XL) 100 MG 24 hr tablet TAKE 1 TABLET DAILY WITH/OR IMMEDIATELY FOLLOWING A MEAL 90 tablet 1  . montelukast (SINGULAIR) 10 MG tablet TAKE 1 TABLET AT BEDTIME (OFFICE VISIT NEEDED FOR ADDITIONAL REFILLS) 90 tablet 3  . Multiple Vitamin (MULTIVITAMIN) tablet Take 1 tablet by mouth daily.    Marland Kitchen NYAMYC powder     . nystatin-triamcinolone (MYCOLOG II) cream     . ONETOUCH ULTRA test strip USE TO MONITOR YOUR GLUCOSE TWICE A DAY 300 each 3  . OSPHENA 60 MG TABS     . PROCTOSOL HC 2.5 % rectal cream     . repaglinide (PRANDIN) 2 MG tablet 1 tab with breakfast, 1 with lunch, and 2 with supper 270 tablet 3  . Semaglutide 14 MG TABS Take 14 mg by mouth daily. (Patient taking differently: Take 14 mg by mouth daily. RYBELSUS) 90 tablet 3  . telmisartan (MICARDIS) 80 MG tablet TAKE 1 TABLET DAILY 90 tablet 1  . zolpidem (AMBIEN) 10 MG tablet Take 1 tablet (10 mg total) by mouth at bedtime as needed for sleep. 90 tablet 3  . BYSTOLIC 20 MG TABS Take 0.5 tablets (10 mg total) by mouth daily. 45 tablet 2  . carvedilol (COREG) 25 MG tablet TAKE 1 TABLET DAILY 90 tablet 3  . empagliflozin (JARDIANCE) 25 MG TABS tablet Take 25 mg by mouth daily. 90 tablet 2   No facility-administered medications prior to visit.    Allergies  Allergen Reactions  . Ace Inhibitors Cough    ROS Review of Systems    Objective:    Physical Exam  BP 128/73 (BP Location: Right Arm, Patient Position: Sitting, Cuff Size: Normal)   Pulse 76   Temp 97.7 F (36.5 C) (Temporal)   Ht 5\' 4"  (1.626 m)   Wt 183 lb 6.4 oz (83.2 kg)   LMP 01/19/2011   SpO2 97%   BMI 31.48 kg/m  Wt  Readings from Last 3 Encounters:  09/14/19 183 lb 6.4  oz (83.2 kg)  09/08/19 183 lb (83 kg)  07/07/19 190 lb 3.2 oz (86.3 kg)   Physical Exam  Constitutional: Oriented to person, place, and time. Appears well-developed and well-nourished.  HENT:  Head: Normocephalic and atraumatic.  Eyes: Conjunctivae and EOM are normal.  Cardiovascular: Normal rate, regular rhythm, normal heart sounds and intact distal pulses.  No murmur heard. Pulmonary/Chest: Effort normal and breath sounds normal. No stridor. No respiratory distress. Has no wheezes.  Neurological: Is alert and oriented to person, place, and time.  Skin: Skin is warm. Capillary refill takes less than 2 seconds.  Psychiatric: Has a normal mood and affect. Behavior is normal. Judgment and thought content normal.    Health Maintenance Due  Topic Date Due  . HIV Screening  Never done  . PAP SMEAR-Modifier  10/17/2019    There are no preventive care reminders to display for this patient.  Lab Results  Component Value Date   TSH 1.170 11/20/2016   Lab Results  Component Value Date   WBC 6.7 03/31/2019   HGB 12.9 03/31/2019   HCT 39.9 03/31/2019   MCV 83 03/31/2019   PLT 289 03/31/2019   Lab Results  Component Value Date   NA 140 03/31/2019   K 3.8 03/31/2019   CO2 23 03/31/2019   GLUCOSE 123 (H) 03/31/2019   BUN 11 03/31/2019   CREATININE 0.81 03/31/2019   BILITOT 0.4 03/31/2019   ALKPHOS 99 03/31/2019   AST 13 03/31/2019   ALT 20 03/31/2019   PROT 6.9 03/31/2019   ALBUMIN 4.1 03/31/2019   CALCIUM 9.5 03/31/2019   ANIONGAP 18 (H) 04/15/2014   Lab Results  Component Value Date   CHOL 154 03/31/2019   Lab Results  Component Value Date   HDL 53 03/31/2019   Lab Results  Component Value Date   LDLCALC 83 03/31/2019   Lab Results  Component Value Date   TRIG 97 03/31/2019   Lab Results  Component Value Date   CHOLHDL 2.9 03/31/2019   Lab Results  Component Value Date   HGBA1C 6.2 (A) 09/08/2019       Assessment & Plan:   Problem List Items Addressed This Visit      Cardiovascular and Mediastinum   Essential hypertension - Patient's blood pressure is at goal of 139/89 or less. Condition is stable. Continue current medications and treatment plan. I recommend that you exercise for 30-45 minutes 5 days a week. I also recommend a balanced diet with fruits and vegetables every day, lean meats, and little fried foods. The DASH diet (you can find this online) is a good example of this.    Relevant Medications   carvedilol (COREG) 25 MG tablet   BYSTOLIC 20 MG TABS    Other Visit Diagnoses    Type 2 diabetes mellitus with other ophthalmic complication, with long-term current use of insulin ([place])    -  Primary well controlled hemoglobin a1c is at goal Continue current exercise Lipids monitored and renal function in range Reviewed diabetic foot care Emphasized importance of eye and dental exam      Relevant Medications   empagliflozin (JARDIANCE) 25 MG TABS tablet   Hyperlipidemia, unspecified hyperlipidemia type       Relevant Medications   carvedilol (COREG) 25 MG tablet   BYSTOLIC 20 MG TABS     Skin wart-  Call for wart removal when you have a window that allows you to go bra free for 3 days   Meds ordered  this encounter  Medications  . carvedilol (COREG) 25 MG tablet    Sig: Take 1 tablet (25 mg total) by mouth daily.    Dispense:  90 tablet    Refill:  3  . BYSTOLIC 20 MG TABS    Sig: Take 0.5 tablets (10 mg total) by mouth daily.    Dispense:  45 tablet    Refill:  2  . empagliflozin (JARDIANCE) 25 MG TABS tablet    Sig: Take 25 mg by mouth daily.    Dispense:  90 tablet    Refill:  2    Follow-up: No follow-ups on file.    Forrest Moron, MD

## 2019-09-16 ENCOUNTER — Other Ambulatory Visit: Payer: Self-pay | Admitting: Family Medicine

## 2019-09-16 NOTE — Telephone Encounter (Signed)
Requested Prescriptions  Pending Prescriptions Disp Refills  . hydrochlorothiazide (HYDRODIURIL) 25 MG tablet [Pharmacy Med Name: HYDROCHLOROTHIAZIDE TABS 25MG ] 90 tablet 3    Sig: TAKE 1 TABLET DAILY     Cardiovascular: Diuretics - Thiazide Passed - 09/16/2019  9:39 PM      Passed - Ca in normal range and within 360 days    Calcium  Date Value Ref Range Status  03/31/2019 9.5 8.7 - 10.3 mg/dL Final         Passed - Cr in normal range and within 360 days    Creat  Date Value Ref Range Status  09/23/2014 0.62 0.50 - 1.10 mg/dL Final   Creatinine, Ser  Date Value Ref Range Status  03/31/2019 0.81 0.57 - 1.00 mg/dL Final         Passed - K in normal range and within 360 days    Potassium  Date Value Ref Range Status  03/31/2019 3.8 3.5 - 5.2 mmol/L Final         Passed - Na in normal range and within 360 days    Sodium  Date Value Ref Range Status  03/31/2019 140 134 - 144 mmol/L Final         Passed - Last BP in normal range    BP Readings from Last 1 Encounters:  09/14/19 128/73         Passed - Valid encounter within last 6 months    Recent Outpatient Visits          2 days ago Type 2 diabetes mellitus with other ophthalmic complication, with long-term current use of insulin ([place])   Primary Care at [facility], Arlie Solomons, MD   5 months ago Health maintenance examination   Primary Care at [facility] [place] A, MD   8 months ago Type 2 diabetes mellitus with other ophthalmic complication, with long-term current use of insulin ([place])   Primary Care at [facility], Zoe A, MD   1 year ago Type 2 diabetes mellitus with other ophthalmic complication, with long-term current use of insulin ([place])   Primary Care at [facility], Arlie Solomons, MD   1 year ago Essential hypertension   Primary Care at [facility], Arlie Solomons, MD

## 2019-09-17 ENCOUNTER — Telehealth: Payer: Self-pay | Admitting: Family Medicine

## 2019-09-17 NOTE — Telephone Encounter (Addendum)
09/17/2019 - PATIENT DROPPED OFF A COPY OF A LETTER DR. Nolon Rod DID LAST YEAR (2020) FOR HER WORK TO PERFORM LIGHT DUTY. SHE NEEDS IT DONE AGAIN FOR THIS YEAR (2021). THE NEW DATES WILL BE EFFECTIVE THE DATE DR. STALLINGS DOES THE NEW LETTER FOR A PERIOD OF 1 YEAR. I HAVE PUT THE FORMS AT THE CLINICAL NURSES STATION IN DR. Nolon Rod [place]. PLEASE FAX TO [facility] AT [phone]  ATTN: Briant Cedar. PATIENT WOULD LIKE TO BE CALLED TO PICK UP A COPY FOR HER RECORDS WHEN IT HAS BEEN FAXED. BEST PHONE FOR PATIENT IS [phone] (CELL) MBC

## 2019-09-18 ENCOUNTER — Encounter: Payer: Self-pay | Admitting: Family Medicine

## 2019-09-18 NOTE — Telephone Encounter (Signed)
Pt letter completed and sent via fax to attn: Briant Cedar at [phone].  Confirmation received.

## 2019-09-28 ENCOUNTER — Ambulatory Visit: Payer: POS | Admitting: Family Medicine

## 2020-01-12 ENCOUNTER — Encounter: Payer: Self-pay | Admitting: Endocrinology

## 2020-01-12 ENCOUNTER — Other Ambulatory Visit: Payer: Self-pay

## 2020-01-12 ENCOUNTER — Ambulatory Visit: Payer: 59 | Admitting: Endocrinology

## 2020-01-12 VITALS — BP 126/70 | HR 72 | Ht 64.0 in | Wt 181.0 lb

## 2020-01-12 DIAGNOSIS — E1165 Type 2 diabetes mellitus with hyperglycemia: Secondary | ICD-10-CM

## 2020-01-12 DIAGNOSIS — Z794 Long term (current) use of insulin: Secondary | ICD-10-CM | POA: Diagnosis not present

## 2020-01-12 DIAGNOSIS — E119 Type 2 diabetes mellitus without complications: Secondary | ICD-10-CM | POA: Diagnosis not present

## 2020-01-12 LAB — POCT GLYCOSYLATED HEMOGLOBIN (HGB A1C): Hemoglobin A1C: 5.9 % — AB (ref 4.0–5.6)

## 2020-01-12 MED ORDER — REPAGLINIDE 2 MG PO TABS: 2.0000 mg | ORAL_TABLET | Freq: Three times a day (TID) | ORAL | 3 refills | Status: DC

## 2020-01-12 NOTE — Patient Instructions (Addendum)
check your blood sugar twice a day.  vary the time of day when you check, between before the 3 meals, and at bedtime.  also check if you have symptoms of your blood sugar being too high or too low.  please keep a record of the readings and bring it to your next appointment here.  You can write it on any piece of paper.  please call us sooner if your blood sugar goes below 70, or if you have a lot of readings over 200.     Please reduce the repaglinide to 1 pill, 3 times a day (just before each meal), and continue the same other diabetes medications.    Please come back for a follow-up appointment in 4 months.

## 2020-01-12 NOTE — Progress Notes (Signed)
Subjective:    Patient ID: Heidi Howell, female    DOB: 11/10/1955, 64 y.o.   MRN: [id number]  HPI Pt returns for f/u of diabetes mellitus: DM type: 2 Dx'ed: 2007.  Complications: none.  Therapy: 4 oral meds.   GDM: never DKA: never Severe hypoglycemia: never.  Pancreatitis: never.  Other: she is pursuing weight-loss surgery; she started  V-GO-20 pump in early 2017; she stopped using her continuous glucose monitor; she took insulin 2014-2021 (V-GO pump 2017-2021) Interval history: no cbg record, but states cbg's vary from 70-180.  It is in general higher as the day goes on.  pt states he feels well in general. Past Medical History:  Diagnosis Date  . Anemia   . Diabetes mellitus without complication ([place])   . Fibroids   . Glaucoma   . H/O: obesity   . Hypertension   . Varicose vein     Past Surgical History:  Procedure Laterality Date  . [place] GLAND CYST EXCISION  2010  . CESAREAN SECTION  [id number]  . DILATION AND CURETTAGE OF UTERUS  1989  . TUBAL LIGATION  1996   bilateral    Social History   Socioeconomic History  . Marital status: Married    Spouse name: Madelaine Etienne  . Number of children: Not on file  . Years of education: Not on file  . Highest education level: Not on file  Occupational History  . Not on file  Tobacco Use  . Smoking status: Never Smoker  . Smokeless tobacco: Never Used  Vaping Use  . Vaping Use: Never used  Substance and Sexual Activity  . Alcohol use: No    Alcohol/week: 0.0 standard drinks  . Drug use: No  . Sexual activity: Yes    Partners: Male    Birth control/protection: Post-menopausal    Comment: 1st intercourse 30 yo-5 partners  Other Topics Concern  . Not on file  Social History Narrative   Exercise walk 2-3 times for 30-40 minutes   Social Determinants of Health   Financial Resource Strain:   . Difficulty of Paying Living Expenses: Not on file  Food Insecurity:   . Worried About Charity fundraiser in  the Last Year: Not on file  . Ran Out of Food in the Last Year: Not on file  Transportation Needs:   . Lack of Transportation (Medical): Not on file  . Lack of Transportation (Non-Medical): Not on file  Physical Activity:   . Days of Exercise per Week: Not on file  . Minutes of Exercise per Session: Not on file  Stress:   . Feeling of Stress : Not on file  Social Connections:   . Frequency of Communication with Friends and Family: Not on file  . Frequency of Social Gatherings with Friends and Family: Not on file  . Attends Religious Services: Not on file  . Active Member of Clubs or Organizations: Not on file  . Attends Archivist Meetings: Not on file  . Marital Status: Not on file  Intimate Partner Violence:   . Fear of Current or Ex-Partner: Not on file  . Emotionally Abused: Not on file  . Physically Abused: Not on file  . Sexually Abused: Not on file    Current Outpatient Medications on File Prior to Visit  Medication Sig Dispense Refill  . aspirin 81 MG tablet Take 81 mg by mouth daily.    Marland Kitchen BYSTOLIC 20 MG TABS Take 0.5 tablets (10 mg total) by  mouth daily. 45 tablet 2  . carvedilol (COREG) 25 MG tablet Take 1 tablet (25 mg total) by mouth daily. 90 tablet 3  . Continuous Blood Gluc Receiver (FREESTYLE LIBRE READER) DEVI USE AS DIRECTED ONCE 6 Device 3  . Continuous Blood Gluc Sensor (FREESTYLE LIBRE SENSOR SYSTEM) MISC 1 Device by Does not apply route once a week. 4 each 11  . empagliflozin (JARDIANCE) 25 MG TABS tablet Take 25 mg by mouth daily. 90 tablet 2  . estrogens, conjugated, (PREMARIN) 0.3 MG tablet Take 0.3 mg by mouth daily. Take daily for 21 days then do not take for 7 days.    . fluconazole (DIFLUCAN) 150 MG tablet Take one tablet daily as needed for yeast. 5 tablet 0  . fluticasone furoate-vilanterol (BREO ELLIPTA) 200-25 MCG/INH AEPB Inhale 1 puff into the lungs daily. 90 each 1  . hydrochlorothiazide (HYDRODIURIL) 25 MG tablet TAKE 1 TABLET DAILY 90  tablet 3  . medroxyPROGESTERone (PROVERA) 5 MG tablet Take 5 mg by mouth daily.    . metFORMIN (GLUCOPHAGE) 1000 MG tablet Take 1 tablet (1,000 mg total) by mouth at bedtime. 90 tablet 3  . metoprolol succinate (TOPROL-XL) 100 MG 24 hr tablet TAKE 1 TABLET DAILY WITH/OR IMMEDIATELY FOLLOWING A MEAL 90 tablet 1  . montelukast (SINGULAIR) 10 MG tablet TAKE 1 TABLET AT BEDTIME (OFFICE VISIT NEEDED FOR ADDITIONAL REFILLS) 90 tablet 3  . Multiple Vitamin (MULTIVITAMIN) tablet Take 1 tablet by mouth daily.    Marland Kitchen NYAMYC powder     . nystatin-triamcinolone (MYCOLOG II) cream     . ONETOUCH ULTRA test strip USE TO MONITOR YOUR GLUCOSE TWICE A DAY 300 each 3  . OSPHENA 60 MG TABS     . PROCTOSOL HC 2.5 % rectal cream     . Semaglutide 14 MG TABS Take 14 mg by mouth daily. (Patient taking differently: Take 14 mg by mouth daily. RYBELSUS) 90 tablet 3  . telmisartan (MICARDIS) 80 MG tablet TAKE 1 TABLET DAILY 90 tablet 1  . zolpidem (AMBIEN) 10 MG tablet Take 1 tablet (10 mg total) by mouth at bedtime as needed for sleep. 90 tablet 3   No current facility-administered medications on file prior to visit.    Allergies  Allergen Reactions  . Ace Inhibitors Cough    Family History  Problem Relation Age of Onset  . Cancer Father        colon  . Cancer Mother        per patient stomach  . Diabetes Maternal Grandmother   . Mental illness Sister   . Heart disease Brother     BP 126/70   Pulse 72   Ht 5\' 4"  (1.626 m)   Wt 181 lb (82.1 kg)   LMP 01/19/2011   SpO2 97%   BMI 31.07 kg/m    Review of Systems She denies hypoglycemia    Objective:   Physical Exam VITAL SIGNS:  See vs page GENERAL: no distress Pulses: dorsalis pedis intact bilat.   MSK: no deformity of the feet CV: no leg edema Skin:  no ulcer on the feet.  normal color and temp on the feet. Neuro: sensation is intact to touch on the feet     Lab Results  Component Value Date   HGBA1C 5.9 (A) 01/12/2020        Assessment & Plan:  Type 2 DM: overcontrolled.    Patient Instructions  check your blood sugar twice a day.  vary the time of  day when you check, between before the 3 meals, and at bedtime.  also check if you have symptoms of your blood sugar being too high or too low.  please keep a record of the readings and bring it to your next appointment here.  You can write it on any piece of paper.  please call us sooner if your blood sugar goes below 70, or if you have a lot of readings over 200.     Please reduce the repaglinide to 1 pill, 3 times a day (just before each meal), and continue the same other diabetes medications.    Please come back for a follow-up appointment in 4 months.

## 2020-03-07 ENCOUNTER — Other Ambulatory Visit: Payer: Self-pay | Admitting: Endocrinology

## 2020-03-07 DIAGNOSIS — E1165 Type 2 diabetes mellitus with hyperglycemia: Secondary | ICD-10-CM

## 2020-03-07 DIAGNOSIS — Z794 Long term (current) use of insulin: Secondary | ICD-10-CM

## 2020-05-20 ENCOUNTER — Other Ambulatory Visit: Payer: Self-pay

## 2020-05-24 ENCOUNTER — Ambulatory Visit: Payer: 59 | Admitting: Endocrinology

## 2020-06-06 ENCOUNTER — Other Ambulatory Visit: Payer: Self-pay | Admitting: Endocrinology

## 2020-06-06 DIAGNOSIS — E1165 Type 2 diabetes mellitus with hyperglycemia: Secondary | ICD-10-CM

## 2020-06-13 ENCOUNTER — Ambulatory Visit: Payer: 59 | Admitting: Endocrinology

## 2020-06-13 ENCOUNTER — Other Ambulatory Visit: Payer: Self-pay

## 2020-06-13 VITALS — BP 142/74 | HR 63 | Ht 64.0 in | Wt 179.4 lb

## 2020-06-13 DIAGNOSIS — Z794 Long term (current) use of insulin: Secondary | ICD-10-CM | POA: Diagnosis not present

## 2020-06-13 DIAGNOSIS — E119 Type 2 diabetes mellitus without complications: Secondary | ICD-10-CM

## 2020-06-13 DIAGNOSIS — E1165 Type 2 diabetes mellitus with hyperglycemia: Secondary | ICD-10-CM

## 2020-06-13 LAB — POCT GLYCOSYLATED HEMOGLOBIN (HGB A1C): Hemoglobin A1C: 6.4 % — AB (ref 4.0–5.6)

## 2020-06-13 MED ORDER — ONETOUCH VERIO VI STRP: 1.0000 | ORAL_STRIP | Freq: Every day | 12 refills | Status: DC

## 2020-06-13 NOTE — Progress Notes (Signed)
Subjective:    Patient ID: Heidi Howell, female    DOB: March 18, 1956, 65 y.o.   MRN: [id number]  HPI Pt returns for f/u of diabetes mellitus: DM type: 2 Dx'ed: 2007.  Complications: none.  Therapy: 4 oral meds.   GDM: never DKA: never Severe hypoglycemia: never.  Pancreatitis: never.  Other: she is pursuing weight-loss surgery; she started  V-GO-20 pump in early 2017; she stopped using her continuous glucose monitor; she took insulin 2014-2021 (V-GO pump 2017-2021) Interval history: no cbg record, but states cbg's vary from 74-160.  It is in general higher as the day goes on.  pt states he feels well in general. She takes meds as rx'ed.   Past Medical History:  Diagnosis Date  . Anemia   . Diabetes mellitus without complication ([place])   . Fibroids   . Glaucoma   . H/O: obesity   . Hypertension   . Varicose vein     Past Surgical History:  Procedure Laterality Date  . [place] GLAND CYST EXCISION  2010  . CESAREAN SECTION  [id number]  . DILATION AND CURETTAGE OF UTERUS  1989  . TUBAL LIGATION  1996   bilateral    Social History   Socioeconomic History  . Marital status: Married    Spouse name: Madelaine Etienne  . Number of children: Not on file  . Years of education: Not on file  . Highest education level: Not on file  Occupational History  . Not on file  Tobacco Use  . Smoking status: Never Smoker  . Smokeless tobacco: Never Used  Vaping Use  . Vaping Use: Never used  Substance and Sexual Activity  . Alcohol use: No    Alcohol/week: 0.0 standard drinks  . Drug use: No  . Sexual activity: Yes    Partners: Male    Birth control/protection: Post-menopausal    Comment: 1st intercourse 51 yo-5 partners  Other Topics Concern  . Not on file  Social History Narrative   Exercise walk 2-3 times for 30-40 minutes   Social Determinants of Health   Financial Resource Strain: Not on file  Food Insecurity: Not on file  Transportation Needs: Not on file  Physical  Activity: Not on file  Stress: Not on file  Social Connections: Not on file  Intimate Partner Violence: Not on file    Current Outpatient Medications on File Prior to Visit  Medication Sig Dispense Refill  . aspirin 81 MG tablet Take 81 mg by mouth daily.    Marland Kitchen BYSTOLIC 20 MG TABS Take 0.5 tablets (10 mg total) by mouth daily. 45 tablet 2  . carvedilol (COREG) 25 MG tablet Take 1 tablet (25 mg total) by mouth daily. 90 tablet 3  . Continuous Blood Gluc Receiver (FREESTYLE LIBRE READER) DEVI USE AS DIRECTED ONCE 6 Device 3  . Continuous Blood Gluc Sensor (FREESTYLE LIBRE SENSOR SYSTEM) MISC 1 Device by Does not apply route once a week. 4 each 11  . empagliflozin (JARDIANCE) 25 MG TABS tablet Take 25 mg by mouth daily. 90 tablet 2  . estrogens, conjugated, (PREMARIN) 0.3 MG tablet Take 0.3 mg by mouth daily. Take daily for 21 days then do not take for 7 days.    . fluconazole (DIFLUCAN) 150 MG tablet Take one tablet daily as needed for yeast. 5 tablet 0  . fluticasone furoate-vilanterol (BREO ELLIPTA) 200-25 MCG/INH AEPB Inhale 1 puff into the lungs daily. 90 each 1  . hydrochlorothiazide (HYDRODIURIL) 25 MG tablet TAKE 1  TABLET DAILY 90 tablet 3  . medroxyPROGESTERone (PROVERA) 5 MG tablet Take 5 mg by mouth daily.    . metFORMIN (GLUCOPHAGE) 1000 MG tablet Take 1 tablet (1,000 mg total) by mouth at bedtime. 90 tablet 3  . metoprolol succinate (TOPROL-XL) 100 MG 24 hr tablet TAKE 1 TABLET DAILY WITH/OR IMMEDIATELY FOLLOWING A MEAL 90 tablet 1  . montelukast (SINGULAIR) 10 MG tablet TAKE 1 TABLET AT BEDTIME (OFFICE VISIT NEEDED FOR ADDITIONAL REFILLS) 90 tablet 3  . Multiple Vitamin (MULTIVITAMIN) tablet Take 1 tablet by mouth daily.    Marland Kitchen NYAMYC powder     . nystatin-triamcinolone (MYCOLOG II) cream     . OSPHENA 60 MG TABS     . PROCTOSOL HC 2.5 % rectal cream     . repaglinide (PRANDIN) 2 MG tablet Take 1 tablet (2 mg total) by mouth 3 (three) times daily before meals. 270 tablet 3  .  RYBELSUS 14 MG TABS Take 1 tablet by mouth once daily 30 tablet 0  . telmisartan (MICARDIS) 80 MG tablet TAKE 1 TABLET DAILY 90 tablet 1  . zolpidem (AMBIEN) 10 MG tablet Take 1 tablet (10 mg total) by mouth at bedtime as needed for sleep. 90 tablet 3   No current facility-administered medications on file prior to visit.    Allergies  Allergen Reactions  . Ace Inhibitors Cough    Family History  Problem Relation Age of Onset  . Cancer Father        colon  . Cancer Mother        per patient stomach  . Diabetes Maternal Grandmother   . Mental illness Sister   . Heart disease Brother     BP (!) 142/74 (BP Location: Right Arm, Patient Position: Sitting, Cuff Size: Normal)   Pulse 63   Ht 5\' 4"  (1.626 m)   Wt 179 lb 6.4 oz (81.4 kg)   LMP 01/19/2011   SpO2 98%   BMI 30.79 kg/m    Review of Systems She denies hypoglycemia.       Objective:   Physical Exam VITAL SIGNS:  See vs page GENERAL: no distress Pulses: dorsalis pedis intact bilat.   MSK: no deformity of the feet CV: no leg edema Skin:  no ulcer on the feet.  normal color and temp on the feet. Neuro: sensation is intact to touch on the feet    Lab Results  Component Value Date   HGBA1C 6.4 (A) 06/13/2020       Assessment & Plan:  HTN: is noted today Type 2 DM: well-controlled   Patient Instructions  Your blood pressure is high today.  Please see your primary care provider soon, to have it rechecked check your blood sugar twice a day.  vary the time of day when you check, between before the 3 meals, and at bedtime.  also check if you have symptoms of your blood sugar being too high or too low.  please keep a record of the readings and bring it to your next appointment here.  You can write it on any piece of paper.  please call us sooner if your blood sugar goes below 70, or if you have a lot of readings over 200.     Please continue the same 4 diabetes medications.    Please come back for a follow-up  appointment in 4 months.

## 2020-06-13 NOTE — Patient Instructions (Addendum)
Your blood pressure is high today.  Please see your primary care provider soon, to have it rechecked check your blood sugar twice a day.  vary the time of day when you check, between before the 3 meals, and at bedtime.  also check if you have symptoms of your blood sugar being too high or too low.  please keep a record of the readings and bring it to your next appointment here.  You can write it on any piece of paper.  please call us sooner if your blood sugar goes below 70, or if you have a lot of readings over 200.     Please continue the same 4 diabetes medications.    Please come back for a follow-up appointment in 4 months.

## 2020-07-05 ENCOUNTER — Other Ambulatory Visit: Payer: Self-pay | Admitting: Endocrinology

## 2020-07-05 DIAGNOSIS — E1165 Type 2 diabetes mellitus with hyperglycemia: Secondary | ICD-10-CM

## 2020-07-05 DIAGNOSIS — Z794 Long term (current) use of insulin: Secondary | ICD-10-CM

## 2020-08-18 ENCOUNTER — Ambulatory Visit (HOSPITAL_COMMUNITY)
Admission: EM | Admit: 2020-08-18 | Discharge: 2020-08-18 | Disposition: A | Discharge status: Home / Self Care | Payer: POS | Attending: Student | Admitting: Student

## 2020-08-18 ENCOUNTER — Ambulatory Visit (INDEPENDENT_AMBULATORY_CARE_PROVIDER_SITE_OTHER): Payer: POS

## 2020-08-18 ENCOUNTER — Encounter (HOSPITAL_COMMUNITY): Payer: Self-pay

## 2020-08-18 ENCOUNTER — Other Ambulatory Visit: Payer: Self-pay

## 2020-08-18 DIAGNOSIS — W19XXXA Unspecified fall, initial encounter: Secondary | ICD-10-CM

## 2020-08-18 DIAGNOSIS — R0781 Pleurodynia: Secondary | ICD-10-CM | POA: Diagnosis not present

## 2020-08-18 DIAGNOSIS — S2232XA Fracture of one rib, left side, initial encounter for closed fracture: Secondary | ICD-10-CM | POA: Diagnosis not present

## 2020-08-18 DIAGNOSIS — R233 Spontaneous ecchymoses: Secondary | ICD-10-CM

## 2020-08-18 LAB — POCT URINALYSIS DIPSTICK, ED / UC
Bilirubin Urine: NEGATIVE
Glucose, UA: 500 mg/dL — AB
Hgb urine dipstick: NEGATIVE
Ketones, ur: NEGATIVE mg/dL
Nitrite: NEGATIVE
Protein, ur: NEGATIVE mg/dL
Specific Gravity, Urine: 1.01 (ref 1.005–1.030)
Urobilinogen, UA: 0.2 mg/dL (ref 0.0–1.0)
pH: 5 (ref 5.0–8.0)

## 2020-08-18 MED ORDER — TIZANIDINE HCL 2 MG PO CAPS: 2.0000 mg | ORAL_CAPSULE | Freq: Three times a day (TID) | ORAL | 0 refills | Status: DC

## 2020-08-18 MED ORDER — TRAMADOL HCL 50 MG PO TABS: 50.0000 mg | ORAL_TABLET | Freq: Four times a day (QID) | ORAL | 0 refills | Status: DC | PRN

## 2020-08-18 NOTE — ED Provider Notes (Signed)
[place]    CSN: [id number] Arrival date & time: 08/18/20  1601      History   Chief Complaint Chief Complaint  Patient presents with  . Fall  . Back Pain    HPI Heidi Howell is a 65 y.o. female presenting with left-sided back and rib pain for 2 days following a fall.  Medical history noncontributory.  Denies dizziness or chest pain before or after the fall.  States she was walking down some stairs and slipped falling down 3 steps.  Landed on her left ribs.  Today with pain of the ribs, worse with movement and inspiration.  Denies shortness of breath, chest pain, dizziness.  Denies pain elsewhere.  Denies abdominal pain, change in bowel or bladder function.  Denies headaches loss of consciousness.   HPI  Past Medical History:  Diagnosis Date  . Anemia   . Diabetes mellitus without complication ([place])   . Fibroids   . Glaucoma   . H/O: obesity   . Hypertension   . Varicose vein     Patient Active Problem List   Diagnosis Date Noted  . Heel pain, bilateral 11/19/2017  . Chronic right shoulder pain 09/23/2017  . Long-term use of aspirin therapy 09/23/2017  . Type 2 diabetes mellitus without complication, with long-term current use of insulin ([place]) 07/14/2015  . Essential hypertension 09/23/2014  . Adverse drug effect 02/12/2014  . Cortical cataract 08/17/2013  . Primary open angle glaucoma 08/17/2013  . No diabetic retinopathy in both eyes 08/17/2013  . Hyperopia 08/17/2013  . Astigmatism 08/17/2013  . Presbyopia 08/17/2013  . Obesity 08/20/2011  . Symptomatic menopausal or female climacteric states 08/20/2011  . Insomnia 08/20/2011  . Fibroids 08/20/2011    Past Surgical History:  Procedure Laterality Date  . [place] GLAND CYST EXCISION  2010  . CESAREAN SECTION  [id number]  . DILATION AND CURETTAGE OF UTERUS  1989  . TUBAL LIGATION  1996   bilateral    OB History    Gravida  3   Para  2   Term      Preterm      AB  1   Living  2      SAB  1   IAB      Ectopic      Multiple      Live Births               Home Medications    Prior to Admission medications   Medication Sig Start Date End Date Taking? Authorizing Provider  tizanidine (ZANAFLEX) 2 MG capsule Take 1 capsule (2 mg total) by mouth 3 (three) times daily. 08/18/20  Yes Hazel Sams, PA-C  traMADol (ULTRAM) 50 MG tablet Take 1 tablet (50 mg total) by mouth every 6 (six) hours as needed. 08/18/20  Yes Hazel Sams, PA-C  aspirin 81 MG tablet Take 81 mg by mouth daily.    [provider]  BYSTOLIC 20 MG TABS Take 0.5 tablets (10 mg total) by mouth daily. 09/14/19   Forrest Moron, MD  carvedilol (COREG) 25 MG tablet Take 1 tablet (25 mg total) by mouth daily. 09/14/19   Forrest Moron, MD  Continuous Blood Gluc Receiver (FREESTYLE LIBRE READER) DEVI USE AS DIRECTED ONCE 03/22/17   Renato Shin, MD  Continuous Blood Gluc Sensor (FREESTYLE LIBRE SENSOR SYSTEM) MISC 1 Device by Does not apply route once a week. 12/31/16   Renato Shin, MD  empagliflozin (JARDIANCE)  25 MG TABS tablet Take 25 mg by mouth daily. 09/14/19   Forrest Moron, MD  estrogens, conjugated, (PREMARIN) 0.3 MG tablet Take 0.3 mg by mouth daily. Take daily for 21 days then do not take for 7 days.    [provider]  fluconazole (DIFLUCAN) 150 MG tablet Take one tablet daily as needed for yeast. 08/20/18   Fontaine, Belinda Block, MD  fluticasone furoate-vilanterol (BREO ELLIPTA) 200-25 MCG/INH AEPB Inhale 1 puff into the lungs daily. 12/03/18   Delia Chimes A, MD  glucose blood (ONETOUCH VERIO) test strip 1 each by Other route daily. And lancets 1/day 06/13/20   Renato Shin, MD  hydrochlorothiazide (HYDRODIURIL) 25 MG tablet TAKE 1 TABLET DAILY 09/16/19   Forrest Moron, MD  medroxyPROGESTERone (PROVERA) 5 MG tablet Take 5 mg by mouth daily.    [provider]  metFORMIN (GLUCOPHAGE) 1000 MG tablet Take 1 tablet (1,000 mg total) by mouth at bedtime.  12/23/18   Forrest Moron, MD  metoprolol succinate (TOPROL-XL) 100 MG 24 hr tablet TAKE 1 TABLET DAILY WITH/OR IMMEDIATELY FOLLOWING A MEAL 06/30/14   [name], MD  montelukast (SINGULAIR) 10 MG tablet TAKE 1 TABLET AT BEDTIME (OFFICE VISIT NEEDED FOR ADDITIONAL REFILLS) 01/28/19   Forrest Moron, MD  Multiple Vitamin (MULTIVITAMIN) tablet Take 1 tablet by mouth daily.    [provider]  [facility] powder  11/26/17   [provider]  nystatin-triamcinolone Lilyan Gilford II) cream  11/26/17   [provider]  OSPHENA 60 MG TABS  03/10/14   [provider]  PROCTOSOL HC 2.5 % rectal cream  11/26/17   [provider]  repaglinide (PRANDIN) 2 MG tablet Take 1 tablet (2 mg total) by mouth 3 (three) times daily before meals. 01/12/20   Renato Shin, MD  RYBELSUS 14 MG TABS Take 1 tablet by mouth once daily 07/05/20   Renato Shin, MD  telmisartan (MICARDIS) 80 MG tablet TAKE 1 TABLET DAILY 12/29/18   Delia Chimes A, MD  zolpidem (AMBIEN) 10 MG tablet Take 1 tablet (10 mg total) by mouth at bedtime as needed for sleep. 06/11/19 06/19/20  Forrest Moron, MD    Family History Family History  Problem Relation Age of Onset  . Cancer Father        colon  . Cancer Mother        per patient stomach  . Diabetes Maternal Grandmother   . Mental illness Sister   . Heart disease Brother     Social History Social History   Tobacco Use  . Smoking status: Never Smoker  . Smokeless tobacco: Never Used  Vaping Use  . Vaping Use: Never used  Substance Use Topics  . Alcohol use: No    Alcohol/week: 0.0 standard drinks  . Drug use: No     Allergies   Ace inhibitors   Review of Systems Review of Systems  Musculoskeletal:       L rib pain  All other systems reviewed and are negative.    Physical Exam Triage Vital Signs ED Triage Vitals  Enc Vitals Group     BP 08/18/20 1813 140/70     Pulse Rate 08/18/20 1813 67     Resp 08/18/20 1813 18      Temp 08/18/20 1813 98.6 F (37 C)     Temp Source 08/18/20 1813 Oral     SpO2 08/18/20 1813 100 %     Weight --  Height --      Head Circumference --      Peak Flow --      Pain Score 08/18/20 1810 10     Pain Loc --      Pain Edu? --      Excl. in [place]? --    No data found.  Updated Vital Signs BP 140/70 (BP Location: Right Arm)   Pulse 67   Temp 98.6 F (37 C) (Oral)   Resp 18   LMP 01/19/2011   SpO2 100%   Visual Acuity Right Eye Distance:   Left Eye Distance:   Bilateral Distance:    Right Eye Near:   Left Eye Near:    Bilateral Near:     Physical Exam Vitals reviewed.  Constitutional:      General: She is not in acute distress.    Appearance: Normal appearance. She is not ill-appearing.  HENT:     Head: Normocephalic and atraumatic.  Eyes:     Extraocular Movements: Extraocular movements intact.     Pupils: Pupils are equal, round, and reactive to light.  Cardiovascular:     Rate and Rhythm: Normal rate and regular rhythm.     Heart sounds: Normal heart sounds.  Pulmonary:     Effort: Pulmonary effort is normal.     Breath sounds: Normal breath sounds and air entry.  Abdominal:     Tenderness: There is no abdominal tenderness. There is no right CVA tenderness, left CVA tenderness, guarding or rebound.     Comments: No abdominal pain.  Musculoskeletal:     Cervical back: Normal range of motion. No swelling, deformity, signs of trauma, rigidity, spasms, tenderness, bony tenderness or crepitus. No pain with movement.     Thoracic back: No swelling, deformity, signs of trauma, spasms, tenderness or bony tenderness. Normal range of motion. No scoliosis.     Lumbar back: No swelling, deformity, signs of trauma, spasms, tenderness or bony tenderness. Normal range of motion. Negative right straight leg raise test and negative left straight leg raise test. No scoliosis.     Comments: Left distal lateral and posterior ribs with faint ecchymosis and tenderness to  palpation.  No obvious bony deformity.  No spinous or paraspinous muscle tenderness, no spinous deformity or step-off.  No pelvic instability.  Absolutely no other injury, deformity, tenderness, ecchymosis, abrasion.  Skin:    Capillary Refill: Capillary refill takes less than 2 seconds.  Neurological:     General: No focal deficit present.     Mental Status: She is alert.     Cranial Nerves: No cranial nerve deficit.  Psychiatric:        Mood and Affect: Mood normal.        Behavior: Behavior normal.        Thought Content: Thought content normal.        Judgment: Judgment normal.      UC Treatments / Results  Labs (all labs ordered are listed, but only abnormal results are displayed) Labs Reviewed  POCT URINALYSIS DIPSTICK, ED / UC - Abnormal; Notable for the following components:      Result Value   Glucose, UA 500 (*)    Leukocytes,Ua TRACE (*)    All other components within normal limits    EKG   Radiology DG Ribs Unilateral W/Chest Left  Result Date: 08/18/2020 CLINICAL DATA:  Left lateral and posterior ribs with ecchymosis and tenderness following fall. EXAM: LEFT RIBS AND CHEST - 3+ VIEW COMPARISON:  Chest x-ray 09/07/2014 FINDINGS: Left eight rib posterolateral nondisplaced fracture. Round metallic BB marker noted overlying the lower left chest. The heart size and mediastinal contours are within normal limits. Low lung volumes with bibasilar streaky airspace opacities consistent with atelectasis. No focal consolidation. No pulmonary edema. No pleural effusion. No pneumothorax. No acute osseous abnormality. IMPRESSION: Left eight rib posterolateral nondisplaced fracture. Electronically Signed   By: Iven Finn M.D.   On: 08/18/2020 19:31    Procedures Procedures (including critical care time)  Medications Ordered in UC Medications - No data to display  Initial Impression / Assessment and Plan / UC Course  I have reviewed the triage vital signs and the  nursing notes.  Pertinent labs & imaging results that were available during my care of the patient were reviewed by me and considered in my medical decision making (see chart for details).     This patient is a 65 year old female presenting with 8th rib fracture.  Afebrile and nontachycardic, oxygenating well on room air, breath sounds are full throughout.  UA wnl.  Xray L ribs and chest-left eighth rib posterolaterally with nondisplaced fracture. Films interpreted by myself and radiologist.  Tramadol and zanaflex. Close f/u with PCP in 1 week. Work restrictions provided. ED return precautions discussed.   Final Clinical Impressions(s) / UC Diagnoses   Final diagnoses:  Closed fracture of one rib of left side, initial encounter     Discharge Instructions     -Start the muscle relaxer-Zanaflex (tizanidine), up to 3 times daily for muscle spasms and pain.  This can make you drowsy, so take at bedtime or when you do not need to drive or operate machinery. -Tramadol, up to every 6 hours as needed for pain.  This can make you drowsy, so take at bedtime as needed. -Follow-up with your primary care in about 1 week for recheck, or sooner if new symptoms like shortness of breath, chest pain. -Limit heavy lifting at work to 10 pounds until medical clearance provided.  Work note provided.    ED Prescriptions    Medication Sig Dispense Auth. Provider   tizanidine (ZANAFLEX) 2 MG capsule Take 1 capsule (2 mg total) by mouth 3 (three) times daily. 21 capsule Hazel Sams, PA-C   traMADol (ULTRAM) 50 MG tablet Take 1 tablet (50 mg total) by mouth every 6 (six) hours as needed. 15 tablet Hazel Sams, PA-C     I have reviewed the PDMP during this encounter.   Hazel Sams, PA-C 08/18/20 1947

## 2020-08-18 NOTE — Discharge Instructions (Addendum)
-  Start the muscle relaxer-Zanaflex (tizanidine), up to 3 times daily for muscle spasms and pain.  This can make you drowsy, so take at bedtime or when you do not need to drive or operate machinery. -Tramadol, up to every 6 hours as needed for pain.  This can make you drowsy, so take at bedtime as needed. -Follow-up with your primary care in about 1 week for recheck, or sooner if new symptoms like shortness of breath, chest pain. -Limit heavy lifting at work to 10 pounds until medical clearance provided.  Work note provided.

## 2020-08-18 NOTE — ED Triage Notes (Signed)
Pt presents left sided back pain x 2 days after slipped and fall on the stairs.

## 2020-08-23 ENCOUNTER — Ambulatory Visit (HOSPITAL_COMMUNITY): Payer: 59

## 2020-10-02 ENCOUNTER — Other Ambulatory Visit: Payer: Self-pay | Admitting: Endocrinology

## 2020-10-02 DIAGNOSIS — Z794 Long term (current) use of insulin: Secondary | ICD-10-CM

## 2020-10-02 DIAGNOSIS — E1165 Type 2 diabetes mellitus with hyperglycemia: Secondary | ICD-10-CM

## 2020-10-24 ENCOUNTER — Ambulatory Visit: Payer: 59 | Admitting: Endocrinology

## 2020-10-31 ENCOUNTER — Ambulatory Visit (INDEPENDENT_AMBULATORY_CARE_PROVIDER_SITE_OTHER): Payer: 59 | Admitting: Endocrinology

## 2020-10-31 ENCOUNTER — Other Ambulatory Visit: Payer: Self-pay

## 2020-10-31 VITALS — BP 114/60 | HR 69 | Ht 64.0 in | Wt 181.4 lb

## 2020-10-31 DIAGNOSIS — E1165 Type 2 diabetes mellitus with hyperglycemia: Secondary | ICD-10-CM

## 2020-10-31 DIAGNOSIS — Z794 Long term (current) use of insulin: Secondary | ICD-10-CM | POA: Diagnosis not present

## 2020-10-31 DIAGNOSIS — E119 Type 2 diabetes mellitus without complications: Secondary | ICD-10-CM | POA: Diagnosis not present

## 2020-10-31 LAB — POCT GLYCOSYLATED HEMOGLOBIN (HGB A1C): Hemoglobin A1C: 6.4 % — AB (ref 4.0–5.6)

## 2020-10-31 NOTE — Progress Notes (Signed)
Subjective:    Patient ID: Heidi Howell, female    DOB: 1955/09/18, 65 y.o.   MRN: [id number]  HPI Pt returns for f/u of diabetes mellitus: DM type: 2 Dx'ed: 2007.  Complications: none.  Therapy: 4 oral meds.   GDM: never DKA: never Severe hypoglycemia: never.  Pancreatitis: never.  Other: she is pursuing weight-loss surgery; she started  V-GO-20 pump in early 2017; she stopped using her continuous glucose monitor; she took insulin 2014-2021 (V-GO pump 2017-2021) Interval history: no cbg record, but states cbg's vary from 75-133.  She seldom has hypoglycemia, and these episodes are mild.  pt states he feels well in general. She takes meds as rx'ed.   Past Medical History:  Diagnosis Date   Anemia    Diabetes mellitus without complication ([place])    Fibroids    Glaucoma    H/O: obesity    Hypertension    Varicose vein     Past Surgical History:  Procedure Laterality Date   BARTHOLIN GLAND CYST EXCISION  2010   CESAREAN SECTION  3220,2542   DILATION AND CURETTAGE OF UTERUS  1989   TUBAL LIGATION  1996   bilateral    Social History   Socioeconomic History   Marital status: Married    Spouse name: Madelaine Etienne   Number of children: Not on file   Years of education: Not on file   Highest education level: Not on file  Occupational History   Not on file  Tobacco Use   Smoking status: Never   Smokeless tobacco: Never  Vaping Use   Vaping Use: Never used  Substance and Sexual Activity   Alcohol use: No    Alcohol/week: 0.0 standard drinks   Drug use: No   Sexual activity: Yes    Partners: Male    Birth control/protection: Post-menopausal    Comment: 1st intercourse 89 yo-5 partners  Other Topics Concern   Not on file  Social History Narrative   Exercise walk 2-3 times for 30-40 minutes   Social Determinants of Radio broadcast assistant Strain: Not on file  Food Insecurity: Not on file  Transportation Needs: Not on file  Physical Activity: Not on file   Stress: Not on file  Social Connections: Not on file  Intimate Partner Violence: Not on file    Current Outpatient Medications on File Prior to Visit  Medication Sig Dispense Refill   aspirin 81 MG tablet Take 81 mg by mouth daily.     BYSTOLIC 20 MG TABS Take 0.5 tablets (10 mg total) by mouth daily. 45 tablet 2   carvedilol (COREG) 25 MG tablet Take 1 tablet (25 mg total) by mouth daily. 90 tablet 3   Continuous Blood Gluc Receiver (FREESTYLE LIBRE READER) DEVI USE AS DIRECTED ONCE 6 Device 3   Continuous Blood Gluc Sensor (FREESTYLE LIBRE SENSOR SYSTEM) MISC 1 Device by Does not apply route once a week. 4 each 11   empagliflozin (JARDIANCE) 25 MG TABS tablet Take 25 mg by mouth daily. 90 tablet 2   estrogens, conjugated, (PREMARIN) 0.3 MG tablet Take 0.3 mg by mouth daily. Take daily for 21 days then do not take for 7 days.     fluconazole (DIFLUCAN) 150 MG tablet Take one tablet daily as needed for yeast. 5 tablet 0   fluticasone furoate-vilanterol (BREO ELLIPTA) 200-25 MCG/INH AEPB Inhale 1 puff into the lungs daily. 90 each 1   glucose blood (ONETOUCH VERIO) test strip 1 each by Other  route daily. And lancets 1/day 100 each 12   hydrochlorothiazide (HYDRODIURIL) 25 MG tablet TAKE 1 TABLET DAILY 90 tablet 3   medroxyPROGESTERone (PROVERA) 5 MG tablet Take 5 mg by mouth daily.     metFORMIN (GLUCOPHAGE) 1000 MG tablet Take 1 tablet (1,000 mg total) by mouth at bedtime. 90 tablet 3   metoprolol succinate (TOPROL-XL) 100 MG 24 hr tablet TAKE 1 TABLET DAILY WITH/OR IMMEDIATELY FOLLOWING A MEAL 90 tablet 1   montelukast (SINGULAIR) 10 MG tablet TAKE 1 TABLET AT BEDTIME (OFFICE VISIT NEEDED FOR ADDITIONAL REFILLS) 90 tablet 3   Multiple Vitamin (MULTIVITAMIN) tablet Take 1 tablet by mouth daily.     NYAMYC powder      nystatin-triamcinolone (MYCOLOG II) cream      OSPHENA 60 MG TABS      PROCTOSOL HC 2.5 % rectal cream      repaglinide (PRANDIN) 2 MG tablet Take 1 tablet (2 mg total) by  mouth 3 (three) times daily before meals. 270 tablet 3   RYBELSUS 14 MG TABS Take 1 tablet by mouth once daily 90 tablet 0   telmisartan (MICARDIS) 80 MG tablet TAKE 1 TABLET DAILY 90 tablet 1   tizanidine (ZANAFLEX) 2 MG capsule Take 1 capsule (2 mg total) by mouth 3 (three) times daily. 21 capsule 0   traMADol (ULTRAM) 50 MG tablet Take 1 tablet (50 mg total) by mouth every 6 (six) hours as needed. 15 tablet 0   zolpidem (AMBIEN) 10 MG tablet Take 1 tablet (10 mg total) by mouth at bedtime as needed for sleep. 90 tablet 3   No current facility-administered medications on file prior to visit.    Allergies  Allergen Reactions   Ace Inhibitors Cough    Family History  Problem Relation Age of Onset   Cancer Father        colon   Cancer Mother        per patient stomach   Diabetes Maternal Grandmother    Mental illness Sister    Heart disease Brother     BP 114/60 (BP Location: Right Arm, Patient Position: Sitting, Cuff Size: Normal)   Pulse 69   Ht 5\' 4"  (1.626 m)   Wt 181 lb 6.4 oz (82.3 kg)   LMP 01/19/2011   SpO2 96%   BMI 31.14 kg/m    Review of Systems     Objective:   Physical Exam GENERAL: no distress Pulses: dorsalis pedis intact bilat.   MSK: no deformity of the feet CV: no leg edema Skin:  no ulcer on the feet.  normal color and temp on the feet. Neuro: sensation is intact to touch on the feet.   Lab Results  Component Value Date   HGBA1C 6.4 (A) 10/31/2020       Assessment & Plan:  Type 2 DM: She would benefit from increased rx, if it can be done with a regimen that avoids or minimizes hypoglycemia. Hypoglycemia, due to repaglinide: in this setting, we cannot increase rx  Patient Instructions  check your blood sugar twice a day.  vary the time of day when you check, between before the 3 meals, and at bedtime.  also check if you have symptoms of your blood sugar being too high or too low.  please keep a record of the readings and bring it to your  next appointment here.  You can write it on any piece of paper.  please call us sooner if your blood sugar goes below  70, or if you have a lot of readings over 200.     Please continue the same 4 diabetes medications.   Please come back for a follow-up appointment in 5 months.

## 2020-10-31 NOTE — Patient Instructions (Addendum)
check your blood sugar twice a day.  vary the time of day when you check, between before the 3 meals, and at bedtime.  also check if you have symptoms of your blood sugar being too high or too low.  please keep a record of the readings and bring it to your next appointment here.  You can write it on any piece of paper.  please call us sooner if your blood sugar goes below 70, or if you have a lot of readings over 200.     Please continue the same 4 diabetes medications.   Please come back for a follow-up appointment in 5 months.

## 2021-01-03 ENCOUNTER — Other Ambulatory Visit: Payer: Self-pay | Admitting: Endocrinology

## 2021-01-03 DIAGNOSIS — Z794 Long term (current) use of insulin: Secondary | ICD-10-CM

## 2021-01-03 DIAGNOSIS — E1165 Type 2 diabetes mellitus with hyperglycemia: Secondary | ICD-10-CM

## 2021-01-06 ENCOUNTER — Encounter (HOSPITAL_BASED_OUTPATIENT_CLINIC_OR_DEPARTMENT_OTHER): Payer: Self-pay

## 2021-01-06 ENCOUNTER — Encounter (HOSPITAL_BASED_OUTPATIENT_CLINIC_OR_DEPARTMENT_OTHER): Payer: Self-pay | Admitting: Family Medicine

## 2021-01-06 ENCOUNTER — Other Ambulatory Visit: Payer: Self-pay

## 2021-01-06 ENCOUNTER — Ambulatory Visit (INDEPENDENT_AMBULATORY_CARE_PROVIDER_SITE_OTHER): Payer: POS | Admitting: Family Medicine

## 2021-01-06 VITALS — BP 126/64 | HR 68 | Ht 64.0 in | Wt 182.0 lb

## 2021-01-06 DIAGNOSIS — Z23 Encounter for immunization: Secondary | ICD-10-CM | POA: Diagnosis not present

## 2021-01-06 DIAGNOSIS — I10 Essential (primary) hypertension: Secondary | ICD-10-CM

## 2021-01-06 DIAGNOSIS — F5101 Primary insomnia: Secondary | ICD-10-CM | POA: Diagnosis not present

## 2021-01-06 MED ORDER — ZOSTER VAC RECOMB ADJUVANTED 50 MCG/0.5ML IM SUSR: 0.5000 mL | Freq: Once | INTRAMUSCULAR | 0 refills | Status: AC

## 2021-01-06 NOTE — Progress Notes (Signed)
New Patient Office Visit  Subjective:  Patient ID: Heidi Howell, female    DOB: 03-01-1956  Age: 65 y.o. MRN: [id number]  CC:  Chief Complaint  Patient presents with   Establish Care    Former PCP - Dr Nolon Rod. No specific concerns or complaints today   Medication Refill    Patient is due for renewals of her prescriptions.     HPI Heidi Howell is a 65 yo female presenting to establish in clinic.  She has current concerns as outlined above.  Past medical history significant for diabetes, hypertension.  DM: Patient follows with endocrinology.  Current medications include metformin, Jardiance, Rybelsus, repaglinide.  Last visit with endocrinology was in June 2022.  Reportedly follows with ophthalmology as well, Dr. Alois Cliche.  Hypertension: Current medications include Bystolic, carvedilol, telmisartan.  Denies any current issues with chest pain, headaches, lightheadedness or dizziness.  Follows with OB/GYN - had mammogram and pap smear completed recently this year and reports of these were normal. Patient also utilizes Ambien as needed to help with sleep.  Past Medical History:  Diagnosis Date   Anemia    Diabetes mellitus without complication ([place])    Fibroids    Glaucoma    H/O: obesity    Hypertension    Varicose vein     Past Surgical History:  Procedure Laterality Date   BARTHOLIN GLAND CYST EXCISION  2010   CESAREAN SECTION  [phone]   DILATION AND CURETTAGE OF UTERUS  1989   TUBAL LIGATION  1996   bilateral    Family History  Problem Relation Age of Onset   Cancer Father        colon   Cancer Mother        per patient stomach   Diabetes Maternal Grandmother    Mental illness Sister    Heart disease Brother     Social History   Socioeconomic History   Marital status: Married    Spouse name: Madelaine Etienne   Number of children: Not on file   Years of education: Not on file   Highest education level: Not on file  Occupational History   Not on  file  Tobacco Use   Smoking status: Never   Smokeless tobacco: Never  Vaping Use   Vaping Use: Never used  Substance and Sexual Activity   Alcohol use: No    Alcohol/week: 0.0 standard drinks   Drug use: No   Sexual activity: Yes    Partners: Male    Birth control/protection: Post-menopausal    Comment: 1st intercourse 73 yo-5 partners  Other Topics Concern   Not on file  Social History Narrative   Exercise walk 2-3 times for 30-40 minutes   Social Determinants of Health   Financial Resource Strain: Not on file  Food Insecurity: Not on file  Transportation Needs: Not on file  Physical Activity: Not on file  Stress: Not on file  Social Connections: Not on file  Intimate Partner Violence: Not on file    Objective:   Today's Vitals: BP 126/64   Pulse 68   Ht '5\' 4"'$  (1.626 m)   Wt 182 lb (82.6 kg)   LMP 01/19/2011   SpO2 98%   BMI 31.24 kg/m   Physical Exam  65 year old female in no acute distress Cardiovascular exam with regular rate and rhythm, no murmurs appreciated Lungs clear to auscultation bilaterally  Assessment & Plan:   Problem List Items Addressed This Visit  Cardiovascular and Mediastinum   Essential hypertension    Blood pressure at goal in office today Continue with current regimen Recommend intermittent blood pressure monitoring at home Recommend [place] working on healthy, gradual weight loss        Other   Insomnia - Primary    Ongoing issue for patient, currently manages with Ambien, will need to consider alternative modalities such as working with Dr. Michail Sermon in regards to CBT in order to decrease dependence on Ambien.  Patient will also be needing to decrease dose of amlodipine in the coming years due to risk of adverse events with increasing age.      Other Visit Diagnoses     Encounter for immunization           Outpatient Encounter Medications as of 01/06/2021  Medication Sig   aspirin 81 MG tablet Take 81  mg by mouth daily.   carvedilol (COREG) 25 MG tablet Take 1 tablet (25 mg total) by mouth daily.   Continuous Blood Gluc Receiver (FREESTYLE LIBRE READER) DEVI USE AS DIRECTED ONCE   estrogens, conjugated, (PREMARIN) 0.3 MG tablet Take 0.3 mg by mouth daily. Take daily for 21 days then do not take for 7 days.   fluconazole (DIFLUCAN) 150 MG tablet Take one tablet daily as needed for yeast.   fluticasone furoate-vilanterol (BREO ELLIPTA) 200-25 MCG/INH AEPB Inhale 1 puff into the lungs daily.   medroxyPROGESTERone (PROVERA) 5 MG tablet Take 5 mg by mouth daily.   metFORMIN (GLUCOPHAGE) 1000 MG tablet Take 1 tablet (1,000 mg total) by mouth at bedtime.   metoprolol succinate (TOPROL-XL) 100 MG 24 hr tablet TAKE 1 TABLET DAILY WITH/OR IMMEDIATELY FOLLOWING A MEAL   montelukast (SINGULAIR) 10 MG tablet TAKE 1 TABLET AT BEDTIME (OFFICE VISIT NEEDED FOR ADDITIONAL REFILLS)   Multiple Vitamin (MULTIVITAMIN) tablet Take 1 tablet by mouth daily.   NYAMYC powder Apply 1 application topically 2 (two) times daily.   nystatin-triamcinolone (MYCOLOG II) cream    OSPHENA 60 MG TABS Take 1 tablet by mouth as directed.   PROCTOSOL HC 2.5 % rectal cream Place 1 application rectally daily as needed for hemorrhoids.   telmisartan (MICARDIS) 80 MG tablet TAKE 1 TABLET DAILY   tizanidine (ZANAFLEX) 2 MG capsule Take 1 capsule (2 mg total) by mouth 3 (three) times daily.   traMADol (ULTRAM) 50 MG tablet Take 1 tablet (50 mg total) by mouth every 6 (six) hours as needed.   [EXPIRED] Zoster Vaccine Adjuvanted [facility]) injection Inject 0.5 mLs into the muscle once for 1 dose.   [DISCONTINUED] BYSTOLIC 20 MG TABS Take 0.5 tablets (10 mg total) by mouth daily.   [DISCONTINUED] Continuous Blood Gluc Sensor (FREESTYLE LIBRE SENSOR SYSTEM) MISC 1 Device by Does not apply route once a week.   [DISCONTINUED] empagliflozin (JARDIANCE) 25 MG TABS tablet Take 25 mg by mouth daily.   [DISCONTINUED] glucose blood (ONETOUCH VERIO)  test strip 1 each by Other route daily. And lancets 1/day   [DISCONTINUED] hydrochlorothiazide (HYDRODIURIL) 25 MG tablet TAKE 1 TABLET DAILY   [DISCONTINUED] repaglinide (PRANDIN) 2 MG tablet Take 1 tablet (2 mg total) by mouth 3 (three) times daily before meals.   [DISCONTINUED] RYBELSUS 14 MG TABS Take 1 tablet by mouth once daily   zolpidem (AMBIEN) 10 MG tablet Take 1 tablet (10 mg total) by mouth at bedtime as needed for sleep.   No facility-administered encounter medications on file as of 01/06/2021.    Follow-up: Return in about 4  months (around 05/08/2021).   Jessiah Wojnar J De [place], MD

## 2021-01-06 NOTE — Patient Instructions (Signed)
  Medication Instructions:  Your physician recommends that you continue on your current medications as directed. Please refer to the Current Medication list given to you today. --If you need a refill on any your medications before your next appointment, please call your pharmacy first. If no refills are authorized on file call the office.-- Follow-Up: Your next appointment:   Your physician recommends that you schedule a follow-up appointment in: 3-4 MONTHS with Dr. de [place]  Thanks for letting us be apart of your health journey!!  Primary Care and Sports Medicine   Dr. Arlina Robes [place]   We encourage you to activate your patient portal called "MyChart".  Sign up information is provided on this After Visit Summary.  MyChart is used to connect with patients for Virtual Visits (Telemedicine).  Patients are able to view lab/test results, encounter notes, upcoming appointments, etc.  Non-urgent messages can be sent to your provider as well. To learn more about what you can do with MyChart, please visit --  [email or web address].

## 2021-01-11 ENCOUNTER — Ambulatory Visit: Payer: 59 | Admitting: Endocrinology

## 2021-02-21 ENCOUNTER — Other Ambulatory Visit: Payer: Self-pay | Admitting: Endocrinology

## 2021-02-22 ENCOUNTER — Telehealth (HOSPITAL_BASED_OUTPATIENT_CLINIC_OR_DEPARTMENT_OTHER): Payer: Self-pay

## 2021-02-22 MED ORDER — EMPAGLIFLOZIN 25 MG PO TABS: 25.0000 mg | ORAL_TABLET | Freq: Every day | ORAL | 1 refills | Status: DC

## 2021-02-22 MED ORDER — HYDROCHLOROTHIAZIDE 25 MG PO TABS: 25.0000 mg | ORAL_TABLET | Freq: Every day | ORAL | 1 refills | Status: DC

## 2021-02-22 MED ORDER — NEBIVOLOL HCL 10 MG PO TABS: 10.0000 mg | ORAL_TABLET | Freq: Every day | ORAL | 1 refills | Status: DC

## 2021-02-22 NOTE — Telephone Encounter (Signed)
Patient called in requesting a refill on bystolic, hctz, and [facility]ardiance [facility]

## 2021-03-27 ENCOUNTER — Other Ambulatory Visit: Payer: Self-pay

## 2021-03-27 ENCOUNTER — Telehealth: Payer: Self-pay | Admitting: Endocrinology

## 2021-03-27 DIAGNOSIS — E1165 Type 2 diabetes mellitus with hyperglycemia: Secondary | ICD-10-CM

## 2021-03-27 MED ORDER — RYBELSUS 14 MG PO TABS: 1.0000 | ORAL_TABLET | Freq: Every day | ORAL | 3 refills | Status: DC

## 2021-03-27 NOTE — Telephone Encounter (Signed)
Sent!

## 2021-03-27 NOTE — Telephone Encounter (Signed)
MEDICATION: Semaglutide (RYBELSUS) 14 MG TABS  PHARMACY:   [place], [place] Phone:  [phone]  Fax:  [phone]      HAS THE PATIENT CONTACTED THEIR PHARMACY?  yes  IS THIS A 90 DAY SUPPLY : yes  IS PATIENT OUT OF MEDICATION: yes  IF NOT; HOW MUCH IS LEFT: 0  LAST APPOINTMENT DATE: @10 /18/2022  NEXT APPOINTMENT DATE:@11 /28/2022  DO WE HAVE YOUR PERMISSION TO LEAVE A DETAILED MESSAGE?: yes  OTHER COMMENTS:    **Let patient know to contact pharmacy at the end of the day to make sure medication is ready. **  ** Please notify patient to allow 48-72 hours to process**  **Encourage patient to contact the pharmacy for refills or they can request refills through [facility]**

## 2021-04-03 ENCOUNTER — Other Ambulatory Visit: Payer: Self-pay

## 2021-04-03 ENCOUNTER — Ambulatory Visit (INDEPENDENT_AMBULATORY_CARE_PROVIDER_SITE_OTHER): Payer: POS | Admitting: Endocrinology

## 2021-04-03 VITALS — BP 134/64 | HR 64 | Ht 64.0 in | Wt 180.0 lb

## 2021-04-03 DIAGNOSIS — Z794 Long term (current) use of insulin: Secondary | ICD-10-CM | POA: Diagnosis not present

## 2021-04-03 DIAGNOSIS — E1165 Type 2 diabetes mellitus with hyperglycemia: Secondary | ICD-10-CM

## 2021-04-03 LAB — POCT GLYCOSYLATED HEMOGLOBIN (HGB A1C): Hemoglobin A1C: 6.5 % — AB (ref 4.0–5.6)

## 2021-04-03 MED ORDER — ONETOUCH VERIO VI STRP: 1.0000 | ORAL_STRIP | Freq: Every day | 12 refills | Status: DC

## 2021-04-03 MED ORDER — CEPHALEXIN 500 MG PO CAPS: 500.0000 mg | ORAL_CAPSULE | Freq: Three times a day (TID) | ORAL | 0 refills | Status: DC

## 2021-04-03 NOTE — Progress Notes (Signed)
Subjective:    Patient ID: Heidi Howell, female    DOB: [birth year], 65 y.o.   MRN: [id number]  HPI Pt returns for f/u of diabetes mellitus: DM type: 2 Dx'ed: 2007.  Complications: none.  Therapy: 4 oral meds.   GDM: never DKA: never Severe hypoglycemia: never.  Pancreatitis: never.  Other: she is pursuing weight-loss surgery; she started  V-GO-20 pump in early 2017; she stopped using her continuous glucose monitor; she took insulin 2014-2021 (V-GO pump 2017-2021) Interval history: no cbg record, but states cbg's vary from 70-161.  She seldom has hypoglycemia, and these episodes are mild.  She takes meds as rx'ed.  Past Medical History:  Diagnosis Date   Anemia    Diabetes mellitus without complication ([place])    Fibroids    Glaucoma    H/O: obesity    Hypertension    Varicose vein     Past Surgical History:  Procedure Laterality Date   BARTHOLIN GLAND CYST EXCISION  2010   CESAREAN SECTION  [id number],[id number]   DILATION AND CURETTAGE OF UTERUS  1989   TUBAL LIGATION  1996   bilateral    Social History   Socioeconomic History   Marital status: Married    Spouse name: Madelaine Etienne   Number of children: Not on file   Years of education: Not on file   Highest education level: Not on file  Occupational History   Not on file  Tobacco Use   Smoking status: Never   Smokeless tobacco: Never  Vaping Use   Vaping Use: Never used  Substance and Sexual Activity   Alcohol use: No    Alcohol/week: 0.0 standard drinks   Drug use: No   Sexual activity: Yes    Partners: Male    Birth control/protection: Post-menopausal    Comment: 1st intercourse 15 yo-5 partners  Other Topics Concern   Not on file  Social History Narrative   Exercise walk 2-3 times for 30-40 minutes   Social Determinants of Radio broadcast assistant Strain: Not on file  Food Insecurity: Not on file  Transportation Needs: Not on file  Physical Activity: Not on file  Stress: Not on file  Social  Connections: Not on file  Intimate Partner Violence: Not on file    Current Outpatient Medications on File Prior to Visit  Medication Sig Dispense Refill   aspirin 81 MG tablet Take 81 mg by mouth daily.     carvedilol (COREG) 25 MG tablet Take 1 tablet (25 mg total) by mouth daily. 90 tablet 3   Continuous Blood Gluc Receiver (FREESTYLE LIBRE READER) DEVI USE AS DIRECTED ONCE 6 Device 3   empagliflozin (JARDIANCE) 25 MG TABS tablet Take 1 tablet (25 mg total) by mouth daily. 90 tablet 1   estrogens, conjugated, (PREMARIN) 0.3 MG tablet Take 0.3 mg by mouth daily. Take daily for 21 days then do not take for 7 days.     fluconazole (DIFLUCAN) 150 MG tablet Take one tablet daily as needed for yeast. 5 tablet 0   fluticasone furoate-vilanterol (BREO ELLIPTA) 200-25 MCG/INH AEPB Inhale 1 puff into the lungs daily. 90 each 1   hydrochlorothiazide (HYDRODIURIL) 25 MG tablet Take 1 tablet (25 mg total) by mouth daily. 90 tablet 1   medroxyPROGESTERone (PROVERA) 5 MG tablet Take 5 mg by mouth daily.     metFORMIN (GLUCOPHAGE) 1000 MG tablet Take 1 tablet (1,000 mg total) by mouth at bedtime. 90 tablet 3   metoprolol succinate (TOPROL-XL)  100 MG 24 hr tablet TAKE 1 TABLET DAILY WITH/OR IMMEDIATELY FOLLOWING A MEAL 90 tablet 1   montelukast (SINGULAIR) 10 MG tablet TAKE 1 TABLET AT BEDTIME (OFFICE VISIT NEEDED FOR ADDITIONAL REFILLS) 90 tablet 3   Multiple Vitamin (MULTIVITAMIN) tablet Take 1 tablet by mouth daily.     nebivolol (BYSTOLIC) 10 MG tablet Take 1 tablet (10 mg total) by mouth daily. 90 tablet 1   NYAMYC powder Apply 1 application topically 2 (two) times daily.     nystatin-triamcinolone (MYCOLOG II) cream      OSPHENA 60 MG TABS Take 1 tablet by mouth as directed.     PROCTOSOL HC 2.5 % rectal cream Place 1 application rectally daily as needed for hemorrhoids.     repaglinide (PRANDIN) 2 MG tablet TAKE 1 TABLET THREE TIMES A DAY BEFORE MEALS 270 tablet 3   Semaglutide (RYBELSUS) 14 MG  TABS Take 1 tablet by mouth daily. 90 tablet 3   telmisartan (MICARDIS) 80 MG tablet TAKE 1 TABLET DAILY 90 tablet 1   tizanidine (ZANAFLEX) 2 MG capsule Take 1 capsule (2 mg total) by mouth 3 (three) times daily. 21 capsule 0   traMADol (ULTRAM) 50 MG tablet Take 1 tablet (50 mg total) by mouth every 6 (six) hours as needed. 15 tablet 0   zolpidem (AMBIEN) 10 MG tablet Take 1 tablet (10 mg total) by mouth at bedtime as needed for sleep. 90 tablet 3   No current facility-administered medications on file prior to visit.    Allergies  Allergen Reactions   Ace Inhibitors Cough    Family History  Problem Relation Age of Onset   Cancer Father        colon   Cancer Mother        per patient stomach   Diabetes Maternal Grandmother    Mental illness Sister    Heart disease Brother     BP 134/64   Pulse 64   Ht 5\' 4"  (1.626 m)   Wt 180 lb (81.6 kg)   LMP 01/19/2011   SpO2 99%   BMI 30.90 kg/m    Review of Systems She has pain at the left 2nd toe paronychial area.      Objective:   Physical Exam Pulses: dorsalis pedis intact bilat.   MSK: no deformity of the feet, except for bilat bunions CV: no leg edema Skin:  no ulcer on the feet.  normal color and temp on the feet. Neuro: sensation is intact to touch on the feet EXT: several ingrown nails.  At the left 2nd toenail, slight erythema of the paronychial area.      Lab Results  Component Value Date   HGBA1C 6.5 (A) 04/03/2021      Assessment & Plan:  Type 2 DM: well-controlled.   Paronychial infection, new.    Patient Instructions  check your blood sugar twice a day.  vary the time of day when you check, between before the 3 meals, and at bedtime.  also check if you have symptoms of your blood sugar being too high or too low.  please keep a record of the readings and bring it to your next appointment here.  You can write it on any piece of paper.  please call us sooner if your blood sugar goes below 70, or if you have  a lot of readings over 200.     Please continue the same 4 diabetes medications.   I have sent a prescription to your  pharmacy, for an antibiotic pill.   Please come back for a follow-up appointment in 4 months.

## 2021-04-03 NOTE — Assessment & Plan Note (Signed)
Blood pressure at goal in office today Continue with current regimen Recommend intermittent blood pressure monitoring at home Recommend [place] working on healthy, gradual weight loss

## 2021-04-03 NOTE — Patient Instructions (Signed)
check your blood sugar twice a day.  vary the time of day when you check, between before the 3 meals, and at bedtime.  also check if you have symptoms of your blood sugar being too high or too low.  please keep a record of the readings and bring it to your next appointment here.  You can write it on any piece of paper.  please call us sooner if your blood sugar goes below 70, or if you have a lot of readings over 200.     Please continue the same 4 diabetes medications.   I have sent a prescription to your pharmacy, for an antibiotic pill.   Please come back for a follow-up appointment in 4 months.

## 2021-04-03 NOTE — Assessment & Plan Note (Signed)
Ongoing issue for patient, currently manages with Ambien, will need to consider alternative modalities such as working with Dr. Michail Sermon in regards to CBT in order to decrease dependence on Ambien.  Patient will also be needing to decrease dose of amlodipine in the coming years due to risk of adverse events with increasing age.

## 2021-05-09 ENCOUNTER — Ambulatory Visit (HOSPITAL_BASED_OUTPATIENT_CLINIC_OR_DEPARTMENT_OTHER): Payer: POS | Admitting: Family Medicine

## 2021-07-24 LAB — HM DIABETES EYE EXAM

## 2021-07-31 ENCOUNTER — Ambulatory Visit (INDEPENDENT_AMBULATORY_CARE_PROVIDER_SITE_OTHER): Payer: POS | Admitting: Endocrinology

## 2021-07-31 ENCOUNTER — Other Ambulatory Visit: Payer: Self-pay

## 2021-07-31 VITALS — BP 132/72 | HR 66 | Wt 179.6 lb

## 2021-07-31 DIAGNOSIS — E119 Type 2 diabetes mellitus without complications: Secondary | ICD-10-CM

## 2021-07-31 DIAGNOSIS — Z794 Long term (current) use of insulin: Secondary | ICD-10-CM

## 2021-07-31 DIAGNOSIS — E1165 Type 2 diabetes mellitus with hyperglycemia: Secondary | ICD-10-CM

## 2021-07-31 LAB — POCT GLYCOSYLATED HEMOGLOBIN (HGB A1C): Hemoglobin A1C: 6.7 % — AB (ref 4.0–5.6)

## 2021-07-31 MED ORDER — METFORMIN HCL ER 500 MG PO TB24: 1000.0000 mg | ORAL_TABLET | Freq: Every day | ORAL | 3 refills | Status: DC

## 2021-07-31 NOTE — Patient Instructions (Addendum)
check your blood sugar twice a day.  vary the time of day when you check, between before the 3 meals, and at bedtime.  also check if you have symptoms of your blood sugar being too high or too low.  please keep a record of the readings and bring it to your next appointment here.  You can write it on any piece of paper.  please call us sooner if your blood sugar goes below 70, or if you have a lot of readings over 200.    ?Please continue the same 4 diabetes medications.    ?Please come back for a follow-up appointment in 4 months.   ? ?

## 2021-07-31 NOTE — Progress Notes (Addendum)
? ?Subjective:  ? ? Patient ID: Heidi Howell, female    DOB: 1956/02/06, 66 y.o.   MRN: [id number] ? ?HPI ?Pt returns for f/u of diabetes mellitus: ?DM type: 2 ?Dx'ed: 2007.  ?Complications: none.  ?Therapy: 4 oral meds.   ?GDM: never ?DKA: never ?Severe hypoglycemia: never.  ?Pancreatitis: never.  ?Other: she is pursuing weight-loss surgery; she started  V-GO-20 pump in early 2017; she stopped using her continuous glucose monitor; she took insulin 2014-2021 (V-GO pump 2017-2021) ?Interval history: Meter is downloaded today, and the printout is scanned into the record.  cbg's vary from 73-148.  All are checked 3AM-7AM.  She has hypoglycemia approx 1/week.  She takes meds as rx'ed.   ?Past Medical History:  ?Diagnosis Date  ? Anemia   ? Diabetes mellitus without complication ([place])   ? Fibroids   ? Glaucoma   ? H/O: obesity   ? Hypertension   ? Varicose vein   ? ? ?Past Surgical History:  ?Procedure Laterality Date  ? [place] GLAND CYST EXCISION  2010  ? CESAREAN SECTION  [id number]  ? [place] OF UTERUS  1989  ? TUBAL LIGATION  1996  ? bilateral  ? ? ?Social History  ? ?Socioeconomic History  ? Marital status: Married  ?  Spouse name: Madelaine Etienne  ? Number of children: Not on file  ? Years of education: Not on file  ? Highest education level: Not on file  ?Occupational History  ? Not on file  ?Tobacco Use  ? Smoking status: Never  ? Smokeless tobacco: Never  ?Vaping Use  ? Vaping Use: Never used  ?Substance and Sexual Activity  ? Alcohol use: No  ?  Alcohol/week: 0.0 standard drinks  ? Drug use: No  ? Sexual activity: Yes  ?  Partners: Male  ?  Birth control/protection: Post-menopausal  ?  Comment: 1st intercourse 77 yo-5 partners  ?Other Topics Concern  ? Not on file  ?Social History Narrative  ? Exercise walk 2-3 times for 30-40 minutes  ? ?Social Determinants of Health  ? ?Financial Resource Strain: Not on file  ?Food Insecurity: Not on file  ?Transportation Needs: Not on file  ?Physical  Activity: Not on file  ?Stress: Not on file  ?Social Connections: Not on file  ?Intimate Partner Violence: Not on file  ? ? ?Current Outpatient Medications on File Prior to Visit  ?Medication Sig Dispense Refill  ? aspirin 81 MG tablet Take 81 mg by mouth daily.    ? carvedilol (COREG) 25 MG tablet Take 1 tablet (25 mg total) by mouth daily. 90 tablet 3  ? Continuous Blood Gluc Receiver (FREESTYLE LIBRE READER) DEVI USE AS DIRECTED ONCE 6 Device 3  ? empagliflozin (JARDIANCE) 25 MG TABS tablet Take 1 tablet (25 mg total) by mouth daily. 90 tablet 1  ? estrogens, conjugated, (PREMARIN) 0.3 MG tablet Take 0.3 mg by mouth daily. Take daily for 21 days then do not take for 7 days.    ? fluconazole (DIFLUCAN) 150 MG tablet Take one tablet daily as needed for yeast. 5 tablet 0  ? fluticasone furoate-vilanterol (BREO ELLIPTA) 200-25 MCG/INH AEPB Inhale 1 puff into the lungs daily. 90 each 1  ? glucose blood (ONETOUCH VERIO) test strip 1 each by Other route daily. And lancets 1/day 100 each 12  ? hydrochlorothiazide (HYDRODIURIL) 25 MG tablet Take 1 tablet (25 mg total) by mouth daily. 90 tablet 1  ? medroxyPROGESTERone (PROVERA) 5 MG tablet Take 5 mg  by mouth daily.    ? metoprolol succinate (TOPROL-XL) 100 MG 24 hr tablet TAKE 1 TABLET DAILY WITH/OR IMMEDIATELY FOLLOWING A MEAL 90 tablet 1  ? montelukast (SINGULAIR) 10 MG tablet TAKE 1 TABLET AT BEDTIME (OFFICE VISIT NEEDED FOR ADDITIONAL REFILLS) 90 tablet 3  ? Multiple Vitamin (MULTIVITAMIN) tablet Take 1 tablet by mouth daily.    ? nebivolol (BYSTOLIC) 10 MG tablet Take 1 tablet (10 mg total) by mouth daily. 90 tablet 1  ? NYAMYC powder Apply 1 application topically 2 (two) times daily.    ? nystatin-triamcinolone (MYCOLOG II) cream     ? OSPHENA 60 MG TABS Take 1 tablet by mouth as directed.    ? PROCTOSOL HC 2.5 % rectal cream Place 1 application rectally daily as needed for hemorrhoids.    ? repaglinide (PRANDIN) 2 MG tablet TAKE 1 TABLET THREE TIMES A DAY BEFORE  MEALS 270 tablet 3  ? Semaglutide (RYBELSUS) 14 MG TABS Take 1 tablet by mouth daily. 90 tablet 3  ? telmisartan (MICARDIS) 80 MG tablet TAKE 1 TABLET DAILY 90 tablet 1  ? tizanidine (ZANAFLEX) 2 MG capsule Take 1 capsule (2 mg total) by mouth 3 (three) times daily. 21 capsule 0  ? traMADol (ULTRAM) 50 MG tablet Take 1 tablet (50 mg total) by mouth every 6 (six) hours as needed. 15 tablet 0  ? zolpidem (AMBIEN) 10 MG tablet Take 1 tablet (10 mg total) by mouth at bedtime as needed for sleep. 90 tablet 3  ? ?No current facility-administered medications on file prior to visit.  ? ? ?Allergies  ?Allergen Reactions  ? Ace Inhibitors Cough  ? ? ?Family History  ?Problem Relation Age of Onset  ? Cancer Father   ?     colon  ? Cancer Mother   ?     per patient stomach  ? Diabetes Maternal Grandmother   ? Mental illness Sister   ? Heart disease Brother   ? ? ?BP 132/72   Pulse 66   Wt 179 lb 9.6 oz (81.5 kg)   LMP 01/19/2011   SpO2 95%   BMI 30.83 kg/m?  ? ? ?Review of Systems ?She has mild HB ?   ?Objective:  ? Physical Exam ?VITAL SIGNS:  See vs page ?GENERAL: no distress ? ? ? ? ?Lab Results  ?Component Value Date  ? CREATININE 0.81 03/31/2019  ? BUN 11 03/31/2019  ? NA 140 03/31/2019  ? K 3.8 03/31/2019  ? CL 103 03/31/2019  ? CO2 23 03/31/2019  ? ?A1c=6.7% ?   ?Assessment & Plan:  ?Type 2 DM. ?Hypoglycemia, due to repaglinide: this limits aggressiveness of glycemic control ? ?Patient Instructions  ?check your blood sugar twice a day.  vary the time of day when you check, between before the 3 meals, and at bedtime.  also check if you have symptoms of your blood sugar being too high or too low.  please keep a record of the readings and bring it to your next appointment here.  You can write it on any piece of paper.  please call us sooner if your blood sugar goes below 70, or if you have a lot of readings over 200.    ?Please continue the same 4 diabetes medications.    ?Please come back for a follow-up appointment  in 4 months.   ? ? ? ?

## 2021-12-06 ENCOUNTER — Other Ambulatory Visit: Payer: Self-pay | Admitting: Endocrinology

## 2021-12-06 ENCOUNTER — Other Ambulatory Visit (INDEPENDENT_AMBULATORY_CARE_PROVIDER_SITE_OTHER): Payer: POS

## 2021-12-06 DIAGNOSIS — Z794 Long term (current) use of insulin: Secondary | ICD-10-CM

## 2021-12-06 DIAGNOSIS — E1165 Type 2 diabetes mellitus with hyperglycemia: Secondary | ICD-10-CM | POA: Diagnosis not present

## 2021-12-06 LAB — COMPREHENSIVE METABOLIC PANEL
ALT: 14 U/L (ref 0–35)
AST: 14 U/L (ref 0–37)
Albumin: 4.2 g/dL (ref 3.5–5.2)
Alkaline Phosphatase: 66 U/L (ref 39–117)
BUN: 16 mg/dL (ref 6–23)
CO2: 28 mEq/L (ref 19–32)
Calcium: 9.5 mg/dL (ref 8.4–10.5)
Chloride: 103 mEq/L (ref 96–112)
Creatinine, Ser: 1.01 mg/dL (ref 0.40–1.20)
GFR: 58.88 mL/min — ABNORMAL LOW (ref >60.00)
Glucose, Bld: 131 mg/dL — ABNORMAL HIGH (ref 70–99)
Potassium: 3.5 mEq/L (ref 3.5–5.1)
Sodium: 139 mEq/L (ref 135–145)
Total Bilirubin: 0.4 mg/dL (ref 0.2–1.2)
Total Protein: 7.1 g/dL (ref 6.0–8.3)

## 2021-12-06 LAB — MICROALBUMIN / CREATININE URINE RATIO
Creatinine,U: 69.8 mg/dL
Microalb Creat Ratio: 1 mg/g (ref 0.0–30.0)
Microalb, Ur: <0.7 mg/dL (ref 0.0–1.9)

## 2021-12-06 LAB — LIPID PANEL
Cholesterol: 154 mg/dL (ref 0–200)
HDL: 50.2 mg/dL (ref >39.00)
LDL Cholesterol: 84 mg/dL (ref 0–99)
NonHDL: 103.8
Total CHOL/HDL Ratio: 3
Triglycerides: 100 mg/dL (ref 0.0–149.0)
VLDL: 20 mg/dL (ref 0.0–40.0)

## 2021-12-06 LAB — HEMOGLOBIN A1C: Hgb A1c MFr Bld: 7.2 % — ABNORMAL HIGH (ref 4.6–6.5)

## 2021-12-08 ENCOUNTER — Ambulatory Visit (INDEPENDENT_AMBULATORY_CARE_PROVIDER_SITE_OTHER): Payer: POS | Admitting: Endocrinology

## 2021-12-08 ENCOUNTER — Encounter: Payer: Self-pay | Admitting: Endocrinology

## 2021-12-08 VITALS — BP 112/62 | HR 69 | Ht 64.0 in | Wt 179.2 lb

## 2021-12-08 DIAGNOSIS — E1165 Type 2 diabetes mellitus with hyperglycemia: Secondary | ICD-10-CM

## 2021-12-08 DIAGNOSIS — I10 Essential (primary) hypertension: Secondary | ICD-10-CM | POA: Diagnosis not present

## 2021-12-08 NOTE — Patient Instructions (Signed)
Start mounjaro with the pen as shown once weekly on the same day of the week.  You may inject in the stomach, thigh or arm as indicated in the brochure given.  You will feel fullness of the stomach with starting the medication and should try to keep the portions at meals small.  You may experience nausea in the first few days which usually gets better over time    Rx 2.5 dose for 2 shots weekly then '5mg'$  using 2 of 2.5 pens, stop rybelsus  Check blood sugars on waking up 2-3 days a week  Also check blood sugars about 2 hours after meals and do this after different meals by rotation  Recommended blood sugar levels on waking up are 90-130 and about 2 hours after meal is 130-160  Please bring your blood sugar monitor to each visit, thank you

## 2021-12-08 NOTE — Progress Notes (Addendum)
Patient ID: Heidi Howell, female   DOB: 1955-05-10, 66 y.o.   MRN: [id number]           Reason for Appointment: Type II Diabetes follow-up   History of Present Illness   Diagnosis date: 2007  Previous history:  Non-insulin hypoglycemic drugs previously used: Metformin, Trulicity, Prandin Rybelsus was started in 11/20 Insulin was started in 2014 and more recently was taking Humulin R  A1c range in the last few years is: 5.8-7.5  Recent history:     Non-insulin hypoglycemic drugs: Rybelsus 14 mg,Prandin 2 mg 3 times daily, metformin ER 2000 mg, Jardiance 25 mg     Insulin regimen: Currently none            Side effects from medications: None  Current self management, blood sugar patterns and problems identified:  A1c is 7.2 and gradually increasing She is checking her blood sugars only occasionally fasting Since not able to lose weight Her lab glucose was 131 late morning Currently taking multiple medications and no side effects from any  Exercise: walking 20 min 3-4/7  Diet management:     Hypoglycemia:  none    Glucometer: One Touch Verio.           Blood Glucose readings not available from home monitoring Checking only sporadic fasting readings  Dietician visit: Most recent:  2017    Weight control:  Wt Readings from Last 3 Encounters:  12/08/21 179 lb 3.2 oz (81.3 kg)  07/31/21 179 lb 9.6 oz (81.5 kg)  04/03/21 180 lb (81.6 kg)            Diabetes labs:  Lab Results  Component Value Date   HGBA1C 7.2 (H) 12/06/2021   HGBA1C 6.7 (A) 07/31/2021   HGBA1C 6.5 (A) 04/03/2021   Lab Results  Component Value Date   MICROALBUR <0.7 12/06/2021   LDLCALC 84 12/06/2021   CREATININE 1.01 12/06/2021    No results found for: "FRUCTOSAMINE"   Allergies as of 12/08/2021       Reactions   Ace Inhibitors Cough        Medication List        Accurate as of December 08, 2021 10:55 AM. If you have any questions, ask your nurse or doctor.           aspirin 81 MG tablet Take 81 mg by mouth daily.   Breo Ellipta 200-25 MCG/ACT Aepb Generic drug: fluticasone furoate-vilanterol Inhale 1 puff into the lungs daily.   carvedilol 25 MG tablet Commonly known as: COREG Take 1 tablet (25 mg total) by mouth daily.   empagliflozin 25 MG Tabs tablet Commonly known as: Jardiance Take 1 tablet (25 mg total) by mouth daily.   estrogens (conjugated) 0.3 MG tablet Commonly known as: PREMARIN Take 0.3 mg by mouth daily. Take daily for 21 days then do not take for 7 days.   fluconazole 150 MG tablet Commonly known as: DIFLUCAN Take one tablet daily as needed for yeast.   FreeStyle Libre Reader Kerrin Mo USE AS DIRECTED ONCE   hydrochlorothiazide 25 MG tablet Commonly known as: HYDRODIURIL Take 1 tablet (25 mg total) by mouth daily.   medroxyPROGESTERone 5 MG tablet Commonly known as: PROVERA Take 5 mg by mouth daily.   metFORMIN 500 MG 24 hr tablet Commonly known as: GLUCOPHAGE-XR Take 2 tablets (1,000 mg total) by mouth daily with breakfast.   metoprolol succinate 100 MG 24 hr tablet Commonly known as: TOPROL-XL TAKE 1 TABLET DAILY WITH/OR IMMEDIATELY  FOLLOWING A MEAL   montelukast 10 MG tablet Commonly known as: SINGULAIR TAKE 1 TABLET AT BEDTIME (OFFICE VISIT NEEDED FOR ADDITIONAL REFILLS)   multivitamin tablet Take 1 tablet by mouth daily.   nebivolol 10 MG tablet Commonly known as: BYSTOLIC Take 1 tablet (10 mg total) by mouth daily.   Nyamyc powder Generic drug: nystatin Apply 1 application topically 2 (two) times daily.   nystatin-triamcinolone cream Commonly known as: MYCOLOG II   OneTouch Verio test strip Generic drug: glucose blood 1 each by Other route daily. And lancets 1/day   Osphena 60 MG Tabs Generic drug: Ospemifene Take 1 tablet by mouth as directed.   Proctosol HC 2.5 % rectal cream Generic drug: hydrocortisone Place 1 application rectally daily as needed for hemorrhoids.   repaglinide 2 MG  tablet Commonly known as: PRANDIN TAKE 1 TABLET THREE TIMES A DAY BEFORE MEALS   Rybelsus 14 MG Tabs Generic drug: Semaglutide Take 1 tablet by mouth daily.   telmisartan 80 MG tablet Commonly known as: MICARDIS TAKE 1 TABLET DAILY   tizanidine 2 MG capsule Commonly known as: Zanaflex Take 1 capsule (2 mg total) by mouth 3 (three) times daily.   traMADol 50 MG tablet Commonly known as: ULTRAM Take 1 tablet (50 mg total) by mouth every 6 (six) hours as needed.   zolpidem 10 MG tablet Commonly known as: Ambien Take 1 tablet (10 mg total) by mouth at bedtime as needed for sleep.        Allergies:  Allergies  Allergen Reactions   Ace Inhibitors Cough    Past Medical History:  Diagnosis Date   Anemia    Diabetes mellitus without complication ([place])    Fibroids    Glaucoma    H/O: obesity    Hypertension    Varicose vein     Past Surgical History:  Procedure Laterality Date   [place] GLAND CYST EXCISION  2010   CESAREAN SECTION  [phone]   DILATION AND CURETTAGE OF UTERUS  1989   TUBAL LIGATION  1996   bilateral    Family History  Problem Relation Age of Onset   Cancer Father        colon   Cancer Mother        per patient stomach   Diabetes Maternal Grandmother    Mental illness Sister    Heart disease Brother     Social History:  reports that she has never smoked. She has never used smokeless tobacco. She reports that she does not drink alcohol and does not use drugs.  Review of Systems:  Last diabetic eye exam date 2021  Last foot exam date: 8/23  Last urine microalbumin: Normal in 8/23  Symptoms of neuropathy: none  Hypertension:   Treatment includes hydrochlorothiazide and telmisartan 80 mg  BP Readings from Last 3 Encounters:  12/08/21 112/62  07/31/21 132/72  04/03/21 134/64    Lipid history: Has had normal lipids without any medications    Lab Results  Component Value Date   CHOL 154 12/06/2021   CHOL 154 03/31/2019    CHOL 158 12/30/2017   Lab Results  Component Value Date   HDL 50.20 12/06/2021   HDL 53 03/31/2019   HDL 43 12/30/2017   Lab Results  Component Value Date   LDLCALC 84 12/06/2021   LDLCALC 83 03/31/2019   [place] 85 12/30/2017   Lab Results  Component Value Date   TRIG 100.0 12/06/2021   TRIG 97 03/31/2019   TRIG  151 (H) 12/30/2017   Lab Results  Component Value Date   CHOLHDL 3 12/06/2021   CHOLHDL 2.9 03/31/2019   CHOLHDL 3.7 12/30/2017   No results found for: "LDLDIRECT"   Examination:   BP 112/62   Pulse 69   Ht '5\' 4"'$  (1.626 m)   Wt 179 lb 3.2 oz (81.3 kg)   LMP 01/15/2011   SpO2 95%   BMI 30.76 kg/m   Body mass index is 30.76 kg/m.   Diabetic Foot Exam - Simple   Simple Foot Form Diabetic Foot exam was performed with the following findings: Yes   Visual Inspection No deformities, no ulcerations, no other skin breakdown bilaterally: Yes Sensation Testing Intact to touch and monofilament testing bilaterally: Yes Pulse Check Posterior Tibialis and Dorsalis pulse intact bilaterally: Yes Comments      ASSESSMENT/ PLAN:    Diabetes type 2 non-insulin-dependent with mild obesity:   Current regimen:Rybelsus 14 mg,Prandin 2 mg 3 times daily, metformin ER 2000 mg, Jardiance 25 mg  See history of present illness for detailed discussion of current diabetes management, blood sugar patterns and problems identified  A1c is now 7.2  Blood glucose control is somewhat worse with A1c increasing She has had some progression of her diabetes despite multiple medications Also has had minimal understanding of diabetes management, blood sugar targets and has not made enough efforts to lose weight Blood sugar monitoring has been inadequate especially after meals  Recommendations:  Switch Rybelsus to [facility] for better efficacy and blood sugar control and weight loss She will start with 2.5 mg for the first 2 weeks and then 5 mg weekly Discussed with the patient  the action of GIP/GLP-1 drugs, the effects on pancreatic and liver function, effects on brain and stomach with improved satiety, slowing gastric emptying, improving satiety and reducing liver glucose output.  Discussed the effects on promoting weight loss. Explained possible side effects of MOUNJARO, most commonly nausea that usually improves over time; discussed safety information in package insert.  Demonstrated the medication injection device and injection technique to the patient.  Showed patient the injection sites for his medication Patient brochure on Mounjaro and explained use of the available co-pay card  Start monitoring blood sugars more regularly including AFTER meals especially dinnertime and discussed blood sugar targets She needs to bring her monitor for download on each visit Encouraged her to increase her walking for exercise She will follow-up with the diabetes educator if needed for meal planning No need to take Prandin in the morning but may take it before her morning snack  History of HYPERTENSION: Well-controlled and regimen includes telmisartan  Patient Instructions  Start mounjaro with the pen as shown once weekly on the same day of the week.  You may inject in the stomach, thigh or arm as indicated in the brochure given.  You will feel fullness of the stomach with starting the medication and should try to keep the portions at meals small.  You may experience nausea in the first few days which usually gets better over time    Rx 2.5 dose for 2 shots weekly then '5mg'$  using 2 of 2.5 pens, stop rybelsus  Check blood sugars on waking up 2-3 days a week  Also check blood sugars about 2 hours after meals and do this after different meals by rotation  Recommended blood sugar levels on waking up are 90-130 and about 2 hours after meal is 130-160  Please bring your blood sugar monitor to each visit, thank  you         Elayne Snare 12/08/2021, 10:55 AM

## 2021-12-25 ENCOUNTER — Other Ambulatory Visit: Payer: Self-pay

## 2021-12-25 MED ORDER — TIRZEPATIDE 5 MG/0.5ML ~~LOC~~ SOAJ: 5.0000 mg | SUBCUTANEOUS | 0 refills | Status: DC

## 2022-01-12 LAB — RESULTS CONSOLE HPV: CHL HPV: NEGATIVE

## 2022-01-12 LAB — HM PAP SMEAR: HM Pap smear: NORMAL

## 2022-01-12 LAB — HM MAMMOGRAPHY

## 2022-01-13 ENCOUNTER — Other Ambulatory Visit: Payer: Self-pay | Admitting: Endocrinology

## 2022-01-31 NOTE — Progress Notes (Signed)
Subjective:    Heidi Howell - 66 y.o. female MRN [id number]  Date of birth: [birth year]  HPI  Heidi Howell is to establish care. She is accompanied by her husband [name] who is part historian.   Current issues and/or concerns: Patient present to establish care. Previous primary provider Delia Chimes, MD. Husband states when Delia Chimes, MD quit practicing a new physician took over patient's care. They do not want to disclose the physician's name. Husband went on to explain he thinks the new physician was practicing illegally because they only saw the physician once and when they returned for follow-up were unable to find the physician. Patient employee of [employer] and would like work restrictions/light duty paperwork completed in regards to chronic back pain. States she has not been seen by an orthopedist for back pain and that her previous primary provider managed her back pain and completed restrictions/light duty paperwork. Also, needs handicap placard completed.    Patient has history of hypertension managed by previous primary provider. Taking prescribed Bystolic, Carvedilol, Hydrochlorothiazide and Telmisartan, no issues or concerns. States she is not due for refills.  History of diabetes and established with Endocrinology. Patient reports she is up to date on mammogram, pap smear, and colonoscopy. She is due for annual physical exam and I discussed next steps with her on returning for completion. Taking prescribed Ambien for insomnia states this was managed by her previous primary provider. States she doesn't need refills on Ambien currently.  I discussed in detail with patient and her husband that due to [facility] office policy of new patient's being established for at least 90 days prior to completion of paperwork I will be unable to complete requested work restrictions/light duty paperwork on today. I offered patient referral to Orthopedics and she is  agreeable. Also, discussed in detail per office policy I will be unable to prescribe Ambien and offered her referral to a specialist who will be able to better assist. Patient declined. Patient states she does not understand why I cannot prescribe Ambien stating "I have been taking this for years." Husband states "So you can't do anything for her?" "We sat in the lobby for 1 hour waiting and in this room for 20 minutes waiting before anyone came in." "They would never do this at [facility]." "See I told you you should've went to [facility]." "That's where I go." "In the past she was seeing other [facility] doctors and they prescribed Ambien." "Her family member is an executive at [employer] and Im going to tell her what you said." "I am a veteran." Husband went on to discuss the upbringing of his children and family moral values.   Explained to patient and her husband that I cannot answer as to why [name] physicians at other offices prescribed Ambien for patient. However, some physicians do have that particular prescribing authority. In regards to husband's statement of me being unable to help patient I explained that I am helping patient by assisting to get her to the recommended specialists that may be able to better assist.  I discussed with patient and her husband that I will go and get the practice manager to speak with them regarding their concerns. I was able to successfully locate practice manager [name] and she did speak with patient and her husband. I discussed with supervising physician Dorna Mai, MD paperwork and Ambien as well.     ROS per HPI  Health Maintenance:  Health Maintenance Due  Topic Date Due   HIV Screening  Never done   COVID-19 Vaccine (3 - Moderna risk series) 09/01/2019   PAP SMEAR-Modifier  10/17/2019   OPHTHALMOLOGY EXAM  07/23/2020   MAMMOGRAM  11/24/2020   INFLUENZA VACCINE  12/05/2021     Past Medical History: Patient Active  Problem List   Diagnosis Date Noted   Abnormal weight gain 02/09/2022   Colon cancer screening 02/09/2022   Family history of malignant neoplasm of digestive organs 02/09/2022   Rectal bleeding 02/09/2022   Gastroesophageal reflux disease 02/08/2022   Heel pain, bilateral 11/19/2017   Chronic right shoulder pain 09/23/2017   Long-term use of aspirin therapy 09/23/2017   Age-related nuclear cataract of both eyes 10/10/2016   Type 2 diabetes mellitus without complication, without long-term current use of insulin ([place]) 07/14/2015   Essential hypertension 09/23/2014   Adverse drug effect 02/12/2014   Cortical cataract 08/17/2013   Primary open-angle glaucoma 08/17/2013   No diabetic retinopathy in both eyes 08/17/2013   Hyperopia 08/17/2013   Astigmatism 08/17/2013   Presbyopia 08/17/2013   Obesity 08/20/2011   Symptomatic menopausal or female climacteric states 08/20/2011   Insomnia 08/20/2011   Fibroids 08/20/2011      Social History   reports that she has never smoked. She has never been exposed to tobacco smoke. She has never used smokeless tobacco. She reports that she does not drink alcohol and does not use drugs.   Family History  family history includes Cancer in her father and mother; Diabetes in her maternal grandmother; Heart disease in her brother; Mental illness in her sister.   Medications: reviewed and updated   Objective:   Physical Exam BP 129/79 (BP Location: Left Arm, Patient Position: Sitting, Cuff Size: Normal)   Pulse 65   Temp 98.3 F (36.8 C)   Resp 16   Ht 5' 4.41" (1.636 m)   Wt 180 lb (81.6 kg)   LMP 01/15/2011   SpO2 97%   BMI 30.51 kg/m   Physical Exam HENT:     Head: Normocephalic and atraumatic.  Eyes:     Extraocular Movements: Extraocular movements intact.     Conjunctiva/sclera: Conjunctivae normal.     Pupils: Pupils are equal, round, and reactive to light.  Cardiovascular:     Rate and Rhythm: Normal rate and regular rhythm.      Pulses: Normal pulses.     Heart sounds: Normal heart sounds.  Pulmonary:     Effort: Pulmonary effort is normal.     Breath sounds: Normal breath sounds.  Musculoskeletal:     Cervical back: Normal range of motion and neck supple.  Neurological:     General: No focal deficit present.     Mental Status: She is alert and oriented to person, place, and time.  Psychiatric:        Mood and Affect: Mood normal.        Behavior: Behavior normal.      Assessment & Plan:  Note - Practice manager [name] and supervising physician Dorna Mai, MD were notified today in office of patient and patient's husband concerns. [name] did speak with both the patient and her husband today in office.   1. Encounter to establish care - Patient presents today to establish care.  - Return for annual physical examination, labs, and health maintenance. Arrive fasting meaning having no food for at least 8 hours prior to appointment. You may have only water  or black coffee. Please take scheduled medications as normal.  2. Primary hypertension - Continue present management. Patient states she does not need refills as of present.  - Counseled on blood pressure goal of less than 130/80, low-sodium, DASH diet, medication compliance, and 150 minutes of moderate intensity exercise per week as tolerated. Counseled on medication adherence and adverse effects. - Follow-up with primary provider in 3 months or sooner if needed.   3. Uncontrolled type 2 diabetes mellitus with hyperglycemia ([place]) - Keep all scheduled appointments at Endocrinology.   4. Chronic back pain, unspecified back location, unspecified back pain laterality - Referral to Orthopedic Surgery for further evaluation and management.  - Ambulatory referral to Orthopedic Surgery  5. Insomnia, unspecified type - I discussed with patient in detail that I will be unable to prescribe Zolpidem. I discussed with supervising physician Dorna Mai,  MD as well.  - Patient declined referral to specialist.       Patient was given clear instructions to go to Emergency Department or return to medical center if symptoms don't improve, worsen, or new problems develop.The patient verbalized understanding.  I discussed the assessment and treatment plan with the patient. The patient was provided an opportunity to ask questions and all were answered. The patient agreed with the plan and demonstrated an understanding of the instructions.   The patient was advised to call back or seek an in-person evaluation if the symptoms worsen or if the condition fails to improve as anticipated.    Durene Fruits, NP 02/09/2022, 1:18 PM Primary Care at [facility]

## 2022-02-07 ENCOUNTER — Ambulatory Visit: Payer: POS | Admitting: Family Medicine

## 2022-02-08 DIAGNOSIS — K219 Gastro-esophageal reflux disease without esophagitis: Secondary | ICD-10-CM | POA: Insufficient documentation

## 2022-02-09 ENCOUNTER — Telehealth: Payer: Self-pay | Admitting: Family

## 2022-02-09 ENCOUNTER — Encounter: Payer: Self-pay | Admitting: Family

## 2022-02-09 ENCOUNTER — Ambulatory Visit: Payer: POS | Admitting: Family

## 2022-02-09 VITALS — BP 129/79 | HR 65 | Temp 98.3°F | Resp 16 | Ht 64.41 in | Wt 180.0 lb

## 2022-02-09 DIAGNOSIS — I10 Essential (primary) hypertension: Secondary | ICD-10-CM | POA: Diagnosis not present

## 2022-02-09 DIAGNOSIS — G4700 Insomnia, unspecified: Secondary | ICD-10-CM

## 2022-02-09 DIAGNOSIS — M549 Dorsalgia, unspecified: Secondary | ICD-10-CM

## 2022-02-09 DIAGNOSIS — K625 Hemorrhage of anus and rectum: Secondary | ICD-10-CM | POA: Insufficient documentation

## 2022-02-09 DIAGNOSIS — Z7689 Persons encountering health services in other specified circumstances: Secondary | ICD-10-CM

## 2022-02-09 DIAGNOSIS — R635 Abnormal weight gain: Secondary | ICD-10-CM | POA: Insufficient documentation

## 2022-02-09 DIAGNOSIS — G8929 Other chronic pain: Secondary | ICD-10-CM

## 2022-02-09 DIAGNOSIS — Z1211 Encounter for screening for malignant neoplasm of colon: Secondary | ICD-10-CM | POA: Insufficient documentation

## 2022-02-09 DIAGNOSIS — E1165 Type 2 diabetes mellitus with hyperglycemia: Secondary | ICD-10-CM

## 2022-02-09 DIAGNOSIS — Z800 Family history of malignant neoplasm of digestive organs: Secondary | ICD-10-CM

## 2022-02-09 HISTORY — DX: Family history of malignant neoplasm of digestive organs: Z80.0

## 2022-02-09 NOTE — Patient Instructions (Signed)
Thank you for choosing Primary Care at [facility] for your medical home!    Heidi Howell was seen by Camillia Herter, NP today.   Heidi Howell's primary care provider is Camillia Herter, NP.   For the best care possible,  you should try to see Durene Fruits, NP whenever you come to office.   We look forward to seeing you again soon!  If you have any questions about your visit today,  please call us at [phone]  Or feel free to reach your provider via [place].   Keeping you healthy   Get these tests Blood pressure- Have your blood pressure checked once a year by your healthcare provider.  Normal blood pressure is 120/80. Weight- Have your body mass index (BMI) calculated to screen for obesity.  BMI is a measure of body fat based on height and weight. You can also calculate your own BMI at GravelBags.it. Cholesterol- Have your cholesterol checked regularly starting at age 30, sooner may be necessary if you have diabetes, high blood pressure, if a family member developed heart diseases at an early age or if you smoke.  Chlamydia, HIV, and other sexual transmitted disease- Get screened each year until the age of 3 then within three months of each new sexual partner. Diabetes- Have your blood sugar checked regularly if you have high blood pressure, high cholesterol, a family history of diabetes or if you are overweight.   Get these vaccines Flu shot- Every fall. Tetanus shot- Every 10 years. Menactra- Single dose; prevents meningitis.   Take these steps Don't smoke- If you do smoke, ask your healthcare provider about quitting. For tips on how to quit, go to www.smokefree.gov or call 1-800-QUIT-NOW. Be physically active- Exercise 5 days a week for at least 30 minutes.  If you are not already physically active start slow and gradually work up to 30 minutes of moderate physical activity.  Examples of moderate activity include walking briskly, mowing the yard, dancing,  swimming bicycling, etc. Eat a healthy diet- Eat a variety of healthy foods such as fruits, vegetables, low fat milk, low fat cheese, yogurt, lean meats, poultry, fish, beans, tofu, etc.  For more information on healthy eating, go to www.thenutritionsource.org Drink alcohol in moderation- Limit alcohol intake two drinks or less a day.  Never drink and drive. Dentist- Brush and floss teeth twice daily; visit your dentis twice a year. Depression-Your emotional health is as important as your physical health.  If you're feeling down, losing interest in things you normally enjoy please talk with your healthcare provider. Gun Safety- If you keep a gun in your home, keep it unloaded and with the safety lock on.  Bullets should be stored separately. Helmet use- Always wear a helmet when riding a motorcycle, bicycle, rollerblading or skateboarding. Safe sex- If you may be exposed to a sexually transmitted infection, use a condom Seat belts- Seat bels can save your life; always wear one. Smoke/Carbon Monoxide detectors- These detectors need to be installed on the appropriate level of your home.  Replace batteries at least once a year. Skin Cancer- When out in the sun, cover up and use sunscreen SPF 15 or higher. Violence- If anyone is threatening or hurting you, please tell your healthcare provider.

## 2022-02-09 NOTE — Telephone Encounter (Signed)
Patient was a New patient to Heidi Howell and had an appt on 02-09-22 @ 10:00am.  Pt arrived and was checked in at 10:03am. [name], CMA "Removed Arrival" at 10:22am. She was given our New Pt paperwork, which includes a DPR form, No show policy and a form informing New patients that we do not fill out FMLA/Disability paperwork until they are established with [place] for 90 days.  All of these were signed by the patient. She had brought in some paperwork to be filled out, as well as a form for a Handicap Placard. Unsure how long the patient was in the room before [name] entered, as we did have a full clinic in the morning. [name] came to me around 11:20am and advised me that the patient wanted to talk with the Practice Manager. [name] stated that the patient was not happy that she did not prescribe controlled substances, that we did not fill out forms for the first 90 days, and that they had been waiting for an hour. After entering the room, the husband, who did most of the talking, was upset that they had waited an hour, and nothing really done for the patient. He brought up the paperwork issue, but understood our policy. The patient mentioned the refill issue on her controlled medication, but they said that they understood. The husband kept referring to a family member that was an executive with [employer]. He consistently brought up the clinic that he goes to, [facility], and how you never for more than a minute once you are checked in, how they handle all of your issues, you are in and out fast, and that he is going to take his wife there. I acknowledged their concerns and explained that we had had a full and busy schedule and hate that they had to wait. I expressed that we did not want to loose them as a patient, but that I understood that they had to do what was best for them. He repeated his issues multiple times. I did ask if they were waiting on any labs or anything and was told that they were waiting on the flu  shot and the Handicap placard form. I told him that I would let the CMA know. Once leaving the room, I mentioned the flu shot but the CMA was not aware that they had wanted it, but would give it. She also informed me that [name] was not going to fill out the placard form. The patients left without checking out, did not receive the flu shot.

## 2022-02-09 NOTE — Progress Notes (Signed)
.  Pt presents to establish care,  -haven't seen a provider in while, unsure if last MD she was seeing was legitimate  -needs handicap placard completed  -wants a light duty form for work completed, adv unsure if provider will complete because she is new patient and office protocol new patient must be est.for 90 days before completion of any paperwork

## 2022-02-14 ENCOUNTER — Ambulatory Visit: Payer: POS | Admitting: Internal Medicine

## 2022-02-14 ENCOUNTER — Other Ambulatory Visit: Payer: Self-pay | Admitting: Endocrinology

## 2022-02-19 ENCOUNTER — Ambulatory Visit: Payer: POS | Admitting: Orthopedic Surgery

## 2022-02-20 ENCOUNTER — Other Ambulatory Visit (INDEPENDENT_AMBULATORY_CARE_PROVIDER_SITE_OTHER): Payer: POS

## 2022-02-20 DIAGNOSIS — E1165 Type 2 diabetes mellitus with hyperglycemia: Secondary | ICD-10-CM

## 2022-02-20 LAB — BASIC METABOLIC PANEL
BUN: 15 mg/dL (ref 6–23)
CO2: 31 mEq/L (ref 19–32)
Calcium: 9.5 mg/dL (ref 8.4–10.5)
Chloride: 101 mEq/L (ref 96–112)
Creatinine, Ser: 0.92 mg/dL (ref 0.40–1.20)
GFR: 65.76 mL/min (ref >60.00)
Glucose, Bld: 139 mg/dL — ABNORMAL HIGH (ref 70–99)
Potassium: 4.1 mEq/L (ref 3.5–5.1)
Sodium: 137 mEq/L (ref 135–145)

## 2022-02-21 LAB — FRUCTOSAMINE: Fructosamine: 261 umol/L (ref 0–285)

## 2022-02-27 ENCOUNTER — Ambulatory Visit (INDEPENDENT_AMBULATORY_CARE_PROVIDER_SITE_OTHER): Payer: POS | Admitting: Endocrinology

## 2022-02-27 ENCOUNTER — Encounter: Payer: Self-pay | Admitting: Endocrinology

## 2022-02-27 VITALS — BP 154/82 | HR 65 | Ht 64.0 in | Wt 185.0 lb

## 2022-02-27 DIAGNOSIS — E1165 Type 2 diabetes mellitus with hyperglycemia: Secondary | ICD-10-CM | POA: Diagnosis not present

## 2022-02-27 DIAGNOSIS — Z23 Encounter for immunization: Secondary | ICD-10-CM

## 2022-02-27 DIAGNOSIS — I10 Essential (primary) hypertension: Secondary | ICD-10-CM

## 2022-02-27 MED ORDER — TIRZEPATIDE 10 MG/0.5ML ~~LOC~~ SOAJ: 10.0000 mg | SUBCUTANEOUS | 1 refills | Status: DC

## 2022-02-27 NOTE — Patient Instructions (Addendum)
Take 1 extra Metformin at dinner x 7 then 2 extra at dinner  Check blood sugars on waking up 3 days a week  Also check blood sugars about 2 hours after meals and do this after different meals by rotation  Recommended blood sugar levels on waking up are 90-130 and about 2 hours after meal is 130-160  Please bring your blood sugar monitor to each visit, thank you  Next Rx Mounjaro '10mg'$ 

## 2022-02-27 NOTE — Progress Notes (Signed)
Patient ID: Heidi Howell, female   DOB: 19-Nov-1955, 66 y.o.   MRN: [id number]           Reason for Appointment: Type II Diabetes follow-up   History of Present Illness   Diagnosis date: 2007  Previous history:  Non-insulin hypoglycemic drugs previously used: Metformin, Trulicity, Prandin Rybelsus was started in 11/20 Insulin was started in 2014 and more recently was taking Humulin R  A1c range in the last few years is: 5.8-7.5  Recent history:     Non-insulin hypoglycemic drugs: Prandin 2 mg 3 times daily, metformin ER 1000 mg, Jardiance 25 mg, Mounjaro 5 mg weekly         Side effects from medications: None  Current self management, blood sugar patterns and problems identified:  A1c is 7.2 previously Fructosamine is 246 She did bring her meter for download today She is checking her blood sugars again mostly fasting but more regularly Although morning sugars are variable recently they seem to be in the low 100 range After dinner has only 1 high reading about 3 weeks ago  She does not think she has any increased satiety with Mounjaro 5 mg weekly which was started in place of Rybelsus Has not seen the dietitian in several years Again not able to lose weight and is asking about Ozempic Her lab glucose was 139 fasting  Exercise: walking 20 min up to 3-4/7 days     Hypoglycemia:  none    Glucometer: One Touch Verio.            PRE-MEAL Morning Lunch Dinner Bedtime Overall  Glucose range: 98-211      Mean/median: 140    140   POST-MEAL PC Breakfast PC Lunch PC Dinner  Glucose range:   123-181  Mean/median:   146    Dietician visit: Most recent:  2017    Weight control:  Wt Readings from Last 3 Encounters:  02/27/22 185 lb (83.9 kg)  02/09/22 180 lb (81.6 kg)  12/08/21 179 lb 3.2 oz (81.3 kg)            Diabetes labs:  Lab Results  Component Value Date   HGBA1C 7.2 (H) 12/06/2021   HGBA1C 6.7 (A) 07/31/2021   HGBA1C 6.5 (A) 04/03/2021   Lab Results   Component Value Date   MICROALBUR <0.7 12/06/2021   LDLCALC 84 12/06/2021   CREATININE 0.92 02/20/2022    Lab Results  Component Value Date   FRUCTOSAMINE 261 02/20/2022     Allergies as of 02/27/2022       Reactions   Ace Inhibitors Cough        Medication List        Accurate as of February 27, 2022  9:40 AM. If you have any questions, ask your nurse or doctor.          aspirin 81 MG tablet Take 81 mg by mouth daily.   carvedilol 25 MG tablet Commonly known as: COREG Take 1 tablet (25 mg total) by mouth daily.   empagliflozin 25 MG Tabs tablet Commonly known as: Jardiance Take 1 tablet (25 mg total) by mouth daily.   hydrochlorothiazide 25 MG tablet Commonly known as: HYDRODIURIL Take 1 tablet (25 mg total) by mouth daily.   metFORMIN 500 MG 24 hr tablet Commonly known as: GLUCOPHAGE-XR Take 2 tablets (1,000 mg total) by mouth daily with breakfast.   montelukast 10 MG tablet Commonly known as: SINGULAIR TAKE 1 TABLET AT BEDTIME (OFFICE VISIT NEEDED FOR  ADDITIONAL REFILLS)   Mounjaro 5 MG/0.5ML Pen Generic drug: tirzepatide INJECT 5 MG INTO THE SKIN ONCE A WEEK.   multivitamin tablet Take 1 tablet by mouth daily.   nebivolol 10 MG tablet Commonly known as: BYSTOLIC Take 1 tablet (10 mg total) by mouth daily.   OneTouch Verio test strip Generic drug: glucose blood 1 each by Other route daily. And lancets 1/day   repaglinide 2 MG tablet Commonly known as: PRANDIN TAKE 1 TABLET THREE TIMES A DAY BEFORE MEALS   telmisartan 80 MG tablet Commonly known as: MICARDIS TAKE 1 TABLET DAILY   zolpidem 10 MG tablet Commonly known as: Ambien Take 1 tablet (10 mg total) by mouth at bedtime as needed for sleep.        Allergies:  Allergies  Allergen Reactions   Ace Inhibitors Cough    Past Medical History:  Diagnosis Date   Anemia    Diabetes mellitus without complication ([place])    Fibroids    Glaucoma    H/O: obesity    Hypertension     Varicose vein     Past Surgical History:  Procedure Laterality Date   BARTHOLIN GLAND CYST EXCISION  2010   CESAREAN SECTION  [phone]   DILATION AND CURETTAGE OF UTERUS  1989   TUBAL LIGATION  1996   bilateral    Family History  Problem Relation Age of Onset   Cancer Father        colon   Cancer Mother        per patient stomach   Diabetes Maternal Grandmother    Mental illness Sister    Heart disease Brother     Social History:  reports that she has never smoked. She has never been exposed to tobacco smoke. She has never used smokeless tobacco. She reports that she does not drink alcohol and does not use drugs.  Review of Systems:  Last diabetic eye exam date 2021  Last foot exam date: 8/23  Last urine microalbumin: Normal in 8/23  Symptoms of neuropathy: none  Hypertension:   Treatment includes hydrochlorothiazide and telmisartan 80 mg  BP Readings from Last 3 Encounters:  02/27/22 (!) 154/82  02/09/22 129/79  12/08/21 112/62    Lipid history: Has had normal lipids without any medications    Lab Results  Component Value Date   CHOL 154 12/06/2021   CHOL 154 03/31/2019   CHOL 158 12/30/2017   Lab Results  Component Value Date   HDL 50.20 12/06/2021   HDL 53 03/31/2019   HDL 43 12/30/2017   Lab Results  Component Value Date   LDLCALC 84 12/06/2021   LDLCALC 83 03/31/2019   LDLCALC 85 12/30/2017   Lab Results  Component Value Date   TRIG 100.0 12/06/2021   TRIG 97 03/31/2019   TRIG 151 (H) 12/30/2017   Lab Results  Component Value Date   CHOLHDL 3 12/06/2021   CHOLHDL 2.9 03/31/2019   CHOLHDL 3.7 12/30/2017   No results found for: "LDLDIRECT"   Examination:   BP (!) 154/82   Pulse 65   Ht '5\' 4"'$  (1.626 m)   Wt 185 lb (83.9 kg)   LMP 01/15/2011   SpO2 99%   BMI 31.76 kg/m   Body mass index is 31.76 kg/m.    ASSESSMENT/ PLAN:    Diabetes type 2 non-insulin-dependent with mild obesity:   Current regimen: Mounjaro 5 mg  weekly,Prandin 2 mg 3 times daily, metformin ER 1000 mg, Jardiance 25 mg  See history of present illness for detailed discussion of current diabetes management, blood sugar patterns and problems identified  A1c is last 7.2  Blood glucose control is likely better as judged by fructosamine of 246 and explained what this test means However she has not monitored readings after meals Fasting readings currently are averaging about 140 Unclear why she has not been on higher doses of metformin and currently only taking 1 g daily  She does not appear to have lost any weight with starting Mounjaro but not clear if she has changed her diet May be having mild constipation with Mounjaro currently Also likely can do better with exercise Renal function stable with Jardiance  Recommendations:  After current prescription is over she can go to 10 mg Mounjaro weekly However she will let us know if she has any more constipation In the meantime will have her take 3 metformin's daily for the next week and after that 2 tablets twice a day, she will let us know when she needs a new prescription Also if she has constipation may change it to regular metformin Consultation with dietitian More readings after meals and consider reducing or dropping Prandin if blood sugar is low normal Recheck A1c on the next visit  History of HYPERTENSION: Blood pressure appears relatively higher today in the office, she will follow-up with her PCP  Will need follow-up lipids  There are no Patient Instructions on file for this visit.   Visit time including counseling = 30 minutes  Elayne Snare 02/27/2022, 9:40 AM

## 2022-03-09 ENCOUNTER — Other Ambulatory Visit: Payer: Self-pay

## 2022-03-09 ENCOUNTER — Ambulatory Visit: Payer: POS | Admitting: Internal Medicine

## 2022-03-09 DIAGNOSIS — E1165 Type 2 diabetes mellitus with hyperglycemia: Secondary | ICD-10-CM

## 2022-03-09 MED ORDER — REPAGLINIDE 2 MG PO TABS: ORAL_TABLET | ORAL | 3 refills | Status: DC

## 2022-03-21 ENCOUNTER — Encounter: Payer: Self-pay | Admitting: Family Medicine

## 2022-03-21 ENCOUNTER — Ambulatory Visit: Payer: POS | Admitting: Family Medicine

## 2022-03-21 VITALS — BP 121/77 | HR 67 | Resp 16 | Ht 64.0 in | Wt 179.8 lb

## 2022-03-21 DIAGNOSIS — F5101 Primary insomnia: Secondary | ICD-10-CM | POA: Diagnosis not present

## 2022-03-21 DIAGNOSIS — I10 Essential (primary) hypertension: Secondary | ICD-10-CM | POA: Diagnosis not present

## 2022-03-21 DIAGNOSIS — Z7689 Persons encountering health services in other specified circumstances: Secondary | ICD-10-CM | POA: Insufficient documentation

## 2022-03-21 DIAGNOSIS — E119 Type 2 diabetes mellitus without complications: Secondary | ICD-10-CM | POA: Diagnosis not present

## 2022-03-21 DIAGNOSIS — Z0289 Encounter for other administrative examinations: Secondary | ICD-10-CM

## 2022-03-21 MED ORDER — TRAZODONE HCL 50 MG PO TABS: 25.0000 mg | ORAL_TABLET | Freq: Every evening | ORAL | 3 refills | Status: DC | PRN

## 2022-03-21 NOTE — Assessment & Plan Note (Signed)
Discussed importance of sleep hygiene, specifically sleeping in cool/dark room and proper wind down prior to attempting sleep  Counseled patient that I would recommend avoiding sleep aid such as Ambien unless absolutely necessary given increased risk of side effects with older age, both patient and her husband voiced understanding  Willing to complete trial of trazodone 25-'50mg'$  at bedtime  Will follow up in 1 month

## 2022-03-21 NOTE — Progress Notes (Signed)
I,Joseline E Rosas,acting as a scribe for [employer], MD.,have documented all relevant documentation on the behalf of [name], MD,as directed by  [name], MD while in the presence of [name], MD.  New patient visit   Patient: Heidi Howell   DOB: Oct 28, 1955   66 y.o. Female  MRN: [id number] Visit Date: 03/21/2022  Today's healthcare provider: [name], MD   Chief Complaint  Patient presents with   New Patient (Initial Visit)   Subjective     Heidi Howell is a 66 y.o. female who presents today as a new patient to establish care.  HPI   Patient is accompanied by her husband who is retired and drives patient to all of her appointments. He states they have been happily married for over 30 years. He hopes that the patient will ultimately be able to join him as a patient at [facility] once she reaches age 62 as he speaks very highly of this practice and their bedside manner throughout this encounter.   Insomnia  Patient reports that this is a problem for several years and thinks it may be hereditary as several of her family members suffer from trouble sleeping as well  She states that she was on prescription for Ambien for 3-4 years and had improved sleep with this medication States that she has been off of this medication for close to 1 year due to changes with her primary care physicians She states that she averages about 3 hours of sleep per night She states that she often will lie in bed for more than an hour  trying to fall asleep Patient's husband states that she often complains of difficulty sleeping but when she is asleep, she is able to stay asleep Patient denies napping during the day 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, clinical (histocompatibility and immunogenetics) at the [employer] however patient's husband states that she does take a 40-minute nap every afternoon when she gets home from work He denies noticing her have any abnormal breathing  patterns while she is asleep and states that he often watches for any changes in her breathing patterns while she is asleep  Request for Handicap Placard  Patient reports having orthopedic issues that make it difficult to ambulate long distances without stopping to rest  She has a DMV form for handicap placard with her  She reports that her previous PCP used to complete this form for her but left the practice abruptly    Health Maintenance  UTD with pap and Mammogram.  Past Medical History:  Diagnosis Date   Anemia    Diabetes mellitus without complication ([place])    Fibroids    Glaucoma    H/O: obesity    Hypertension    Varicose vein    Past Surgical History:  Procedure Laterality Date   BARTHOLIN GLAND CYST EXCISION  2010   CESAREAN SECTION  [id number]   DILATION AND CURETTAGE OF [place]   bilateral   Family Status  Relation Name Status   Father  Deceased   Mother  Deceased   MGM  Deceased   MGF  Deceased   Sister  Alive   Brother  Alive   Brother  Alive   Sister  Alive   Family History  Problem Relation Age of Onset   Cancer Father        colon   Cancer Mother        per patient  stomach   Diabetes Maternal Grandmother    Mental illness Sister    Heart disease Brother    Social History   Socioeconomic History   Marital status: Married    Spouse name: Madelaine Etienne   Number of children: Not on file   Years of education: Not on file   Highest education level: Not on file  Occupational History   Not on file  Tobacco Use   Smoking status: Never    Passive exposure: Never   Smokeless tobacco: Never  Vaping Use   Vaping Use: Never used  Substance and Sexual Activity   Alcohol use: No    Alcohol/week: 0.0 standard drinks of alcohol   Drug use: No   Sexual activity: Yes    Partners: Male    Birth control/protection: Post-menopausal    Comment: 1st intercourse 75 yo-5 partners  Other Topics Concern   Not on file  Social  History Narrative   Exercise walk 2-3 times for 30-40 minutes   Social Determinants of Health   Financial Resource Strain: Not on file  Food Insecurity: Not on file  Transportation Needs: Not on file  Physical Activity: Not on file  Stress: Not on file  Social Connections: Not on file   Outpatient Medications Prior to Visit  Medication Sig   aspirin 81 MG tablet Take 81 mg by mouth daily.   carvedilol (COREG) 25 MG tablet Take 1 tablet (25 mg total) by mouth daily.   dorzolamide-timolol (COSOPT) 2-0.5 % ophthalmic solution Place 1 drop into both eyes 2 (two) times daily.   empagliflozin (JARDIANCE) 25 MG TABS tablet Take 1 tablet (25 mg total) by mouth daily.   glucose blood (ONETOUCH VERIO) test strip 1 each by Other route daily. And lancets 1/day   hydrochlorothiazide (HYDRODIURIL) 25 MG tablet Take 1 tablet (25 mg total) by mouth daily.   metFORMIN (GLUCOPHAGE-XR) 500 MG 24 hr tablet Take 2 tablets (1,000 mg total) by mouth daily with breakfast.   montelukast (SINGULAIR) 10 MG tablet TAKE 1 TABLET AT BEDTIME (OFFICE VISIT NEEDED FOR ADDITIONAL REFILLS)   Multiple Vitamin (MULTIVITAMIN) tablet Take 1 tablet by mouth daily.   nebivolol (BYSTOLIC) 10 MG tablet Take 1 tablet (10 mg total) by mouth daily.   Netarsudil Dimesylate 0.02 % SOLN Apply to eye. Instill 1 drop in both eyes every evening   repaglinide (PRANDIN) 2 MG tablet TAKE 1 TABLET THREE TIMES A DAY BEFORE MEALS   telmisartan (MICARDIS) 80 MG tablet TAKE 1 TABLET DAILY   tirzepatide (MOUNJARO) 10 MG/0.5ML Pen Inject 10 mg into the skin once a week.   zolpidem (AMBIEN) 10 MG tablet Take 1 tablet (10 mg total) by mouth at bedtime as needed for sleep.   No facility-administered medications prior to visit.   Allergies  Allergen Reactions   Ace Inhibitors Cough    Immunization History  Administered Date(s) Administered   Influenza, Seasonal, Injecte, Preservative Fre 05/29/2012   Influenza,inj,Quad PF,6+ Mos 02/26/2013,  01/14/2014, 04/14/2015, 02/21/2017, 04/28/2018, 03/31/2019, 02/27/2022   Influenza-Unspecified 02/23/2017   Moderna Sars-Covid-2 Vaccination 06/29/2019, 08/04/2019   Pneumococcal Conjugate-13 02/22/2014   Pneumococcal Polysaccharide-23 02/21/2017   Pneumococcal-Unspecified 12/30/2008, 05/07/2010   Tdap 11/20/2016    Health Maintenance  Topic Date Due   HIV Screening  Never done   COVID-19 Vaccine (3 - Moderna risk series) 09/01/2019   PAP SMEAR-Modifier  10/17/2019   OPHTHALMOLOGY EXAM  07/23/2020   MAMMOGRAM  11/24/2020   Zoster Vaccines- Shingrix (1 of 2) 05/12/2022 (Originally  07/19/1976)   HEMOGLOBIN A1C  06/08/2022   Diabetic kidney evaluation - Urine ACR  12/07/2022   FOOT EXAM  12/09/2022   Diabetic kidney evaluation - GFR measurement  02/21/2023   COLONOSCOPY (Pts 45-61yr Insurance coverage will need to be confirmed)  02/13/2029   INFLUENZA VACCINE  Completed   Hepatitis C Screening  Completed   HPV VACCINES  Aged Out    Patient Care Team: SEulis Foster MD as PCP - General (Family Medicine) MJuanita Craver MD as Consulting Physician (Gastroenterology) ERenato Shin MD (Inactive) as Consulting Physician (Endocrinology)  Review of Systems  Eyes:  Positive for redness.  Psychiatric/Behavioral:  Positive for sleep disturbance.        Objective    BP 121/77 (BP Location: Left Arm, Patient Position: Sitting, Cuff Size: Normal)   Pulse 67   Resp 16   Ht '5\' 4"'$  (1.626 m)   Wt 179 lb 12.8 oz (81.6 kg)   LMP 01/15/2011   BMI 30.86 kg/m    Physical Exam Vitals reviewed.  Constitutional:      General: She is not in acute distress.    Appearance: Normal appearance. She is not ill-appearing, toxic-appearing or diaphoretic.  Eyes:     Conjunctiva/sclera: Conjunctivae normal.  Cardiovascular:     Rate and Rhythm: Normal rate and regular rhythm.     Pulses: Normal pulses.     Heart sounds: Normal heart sounds. No murmur heard.    No friction rub. No  gallop.  Pulmonary:     Effort: Pulmonary effort is normal. No respiratory distress.     Breath sounds: Normal breath sounds. No stridor. No wheezing, rhonchi or rales.  Abdominal:     General: Bowel sounds are normal. There is no distension.     Palpations: Abdomen is soft.     Tenderness: There is no abdominal tenderness.  Musculoskeletal:     Right lower leg: No edema.     Left lower leg: No edema.  Skin:    Findings: No erythema or rash.  Neurological:     Mental Status: She is alert and oriented to person, place, and time.      Depression Screen    02/09/2022   10:38 AM 04/03/2021    1:33 PM 09/14/2019   11:40 AM 03/31/2019    8:20 AM  PHQ 2/9 Scores  PHQ - 2 Score 0 2 0 0  PHQ- 9 Score  8     No results found for any visits on 03/21/22.  Assessment & Plan      Problem List Items Addressed This Visit       Cardiovascular and Mediastinum   Essential hypertension    Chronic, controlled Continue current medications at current doses CMP recommended for next visit during physical  Follow-up 6 months          Endocrine   Type 2 diabetes mellitus without complication, without long-term current use of insulin ([place]    Followed by endocrinology  Last A1c >7  She will continue current medications as listed above at current doses  No changes today  Recommended for eye exam  Patient is down 6 pounds since starting Mounjaro         Other   Insomnia - Primary    Discussed importance of sleep hygiene, specifically sleeping in cool/dark room and proper wind down prior to attempting sleep  Counseled patient that I would recommend avoiding sleep aid such as Ambien unless absolutely necessary given increased risk of side  effects with older age, both patient and her husband voiced understanding  Willing to complete trial of trazodone 25-'50mg'$  at bedtime  Will follow up in 1 month        Encounter to establish care with new doctor   Encounter for completion of form  with patient    Handicap placard form completed in office today for chronic orthopedic issue         Return in about 1 month (around 04/20/2022) for CPE.     I, [name], MD, have reviewed all documentation for this visit. The documentation on 03/24/22 for the exam, diagnosis, procedures, and orders are all accurate and complete.  Portions of this information were initially documented by the CMA and reviewed by me for thoroughness and accuracy.      [name], MD  [facility] [phone] (phone) [phone] (fax)  [place]

## 2022-03-21 NOTE — Assessment & Plan Note (Signed)
Chronic, controlled Continue current medications at current doses CMP recommended for next visit during physical  Follow-up 6 months

## 2022-03-21 NOTE — Assessment & Plan Note (Addendum)
Followed by endocrinology  Last A1c >7  She will continue current medications as listed above at current doses  No changes today  Recommended for eye exam  Patient is down 6 pounds since starting [facility]

## 2022-03-24 DIAGNOSIS — Z0289 Encounter for other administrative examinations: Secondary | ICD-10-CM | POA: Insufficient documentation

## 2022-03-24 NOTE — Assessment & Plan Note (Signed)
Handicap placard form completed in office today for chronic orthopedic issue

## 2022-04-03 ENCOUNTER — Encounter (HOSPITAL_BASED_OUTPATIENT_CLINIC_OR_DEPARTMENT_OTHER): Payer: Self-pay | Admitting: Plastic Surgery

## 2022-04-03 ENCOUNTER — Other Ambulatory Visit: Payer: Self-pay

## 2022-04-03 NOTE — Progress Notes (Signed)
   04/03/22 1110  PAT Phone Screen  Is the patient taking a GLP-1 receptor agonist? Yes  Has the patient been informed on holding medication? Yes  Do You Have Diabetes? Yes  Do You Have Hypertension? Yes  Have You Ever Been to the ER for Asthma? No  Have You Taken Oral Steroids in the Past 3 Months? No  Do you Take Phenteramine or any Other Diet Drugs? No  Recent  Lab Work, EKG, CXR? No  Do you have a history of heart problems? No  Any Recent Hospitalizations? No  Height '5\' 4"'$  (1.626 m)  Weight 81.6 kg  Pat Appointment Scheduled Yes

## 2022-04-03 NOTE — Progress Notes (Signed)
   04/03/22 1110  Pre-op Phone Call  Surgery Date Verified 04/10/22  Arrival Time Verified 0745  Surgery Location Verified [facility]  Medical History Reviewed Yes  Is the patient taking a GLP-1 receptor agonist? Yes  Has the patient been informed on holding medication? Yes  Does the patient have diabetes? Type II  Does the patient use a Continuous Blood Glucose Monitor? No  Is the patient on an insulin pump? No  Has the diabetes coordinator been notified? No  Do you have a history of heart problems? No  Antiarrhythmic device type  (n/a)  Patient educated on enhanced recovery. Yes  Patient educated about smoking cessation 24 hours prior to surgery. N/A Non-Smoker  Patient verbalizes understanding of bowel prep? N/A  Med Rec Completed Yes  Take the Following Meds the Morning of Surgery day of surgery coreg, eye drops ok; hold aspirin and multivitamin 5 days prior to surgery; hold mounjaro 7 days prior to surgery  Recent  Lab Work, EKG, CXR? No  NPO (Including gum & candy) After midnight  Patient instructed to stop clear liquids including Carb loading drink at: 0545  Stop Solids, Milk, Candy, and Gum STARTING AT MIDNIGHT  Did patient view EMMI videos? No  Responsible adult to drive and be with you for 24 hours? Yes  Name & Phone Number for Ride/Caregiver Madelaine Etienne (spouse)  No Jewelry, money, nail polish or make-up.  No lotions, powders, perfumes. No shaving  48 hrs. prior to surgery. Yes  Contacts, Dentures & Glasses Will Have to be Removed Before OR. Yes  Please bring your ID and Insurance Card the morning of your surgery. (Surgery Centers Only) Yes  Bring any papers or x-rays with you that your surgeon gave you. Yes  Instructed to contact the location of procedure/ provider if they or anyone in their household develops symptoms or tests positive for COVID-19, has close contact with someone who tests positive for COVID, or has known exposure to any contagious illness. Yes  Call this  number the morning of surgery  with any problems that may cancel your surgery. [phone]  Covid-19 Assessment  Have you had a positive COVID-19 test within the previous 90 days? No  COVID Testing Guidance Proceed with the additional questions.  Patient's surgery required a COVID-19 test (cardiothoracic, complex ENT, and bronchoscopies/ EBUS) No

## 2022-04-04 ENCOUNTER — Encounter (HOSPITAL_BASED_OUTPATIENT_CLINIC_OR_DEPARTMENT_OTHER)
Admission: RE | Admit: 2022-04-04 | Discharge: 2022-04-04 | Disposition: A | Discharge status: Home / Self Care | Payer: POS | Source: Ambulatory Visit | Attending: Plastic Surgery | Admitting: Plastic Surgery

## 2022-04-04 DIAGNOSIS — E119 Type 2 diabetes mellitus without complications: Secondary | ICD-10-CM | POA: Insufficient documentation

## 2022-04-04 DIAGNOSIS — Z01818 Encounter for other preprocedural examination: Secondary | ICD-10-CM | POA: Diagnosis present

## 2022-04-04 DIAGNOSIS — N62 Hypertrophy of breast: Secondary | ICD-10-CM | POA: Diagnosis not present

## 2022-04-04 LAB — CBC WITH DIFFERENTIAL/PLATELET
Abs Immature Granulocytes: 0.01 10*3/uL (ref 0.00–0.07)
Basophils Absolute: 0.1 10*3/uL (ref 0.0–0.1)
Basophils Relative: 1 %
Eosinophils Absolute: 0.2 10*3/uL (ref 0.0–0.5)
Eosinophils Relative: 3 %
HCT: 37.5 % (ref 36.0–46.0)
Hemoglobin: 12.4 g/dL (ref 12.0–15.0)
Immature Granulocytes: 0 %
Lymphocytes Relative: 36 %
Lymphs Abs: 2.4 10*3/uL (ref 0.7–4.0)
MCH: 27.7 pg (ref 26.0–34.0)
MCHC: 33.1 g/dL (ref 30.0–36.0)
MCV: 83.9 fL (ref 80.0–100.0)
Monocytes Absolute: 0.6 10*3/uL (ref 0.1–1.0)
Monocytes Relative: 8 %
Neutro Abs: 3.4 10*3/uL (ref 1.7–7.7)
Neutrophils Relative %: 52 %
Platelets: 281 10*3/uL (ref 150–400)
RBC: 4.47 MIL/uL (ref 3.87–5.11)
RDW: 12.8 % (ref 11.5–15.5)
WBC: 6.6 10*3/uL (ref 4.0–10.5)
nRBC: 0 % (ref 0.0–0.2)

## 2022-04-04 LAB — BASIC METABOLIC PANEL
Anion gap: 6 (ref 5–15)
BUN: 11 mg/dL (ref 8–23)
CO2: 27 mmol/L (ref 22–32)
Calcium: 9.2 mg/dL (ref 8.9–10.3)
Chloride: 107 mmol/L (ref 98–111)
Creatinine, Ser: 0.83 mg/dL (ref 0.44–1.00)
GFR, Estimated: >60 mL/min (ref >60)
Glucose, Bld: 117 mg/dL — ABNORMAL HIGH (ref 70–99)
Potassium: 4.2 mmol/L (ref 3.5–5.1)
Sodium: 140 mmol/L (ref 135–145)

## 2022-04-04 NOTE — H&P (Signed)
Subjective:     Patient ID: Heidi Howell is a 66 y.o. female.   HPI   Returns for follow up discussion breast reduction. Current 42DD. Reports several year history neck back and shoulder pain. Reports associated numbness hands and skin tags multiple beneath breasts. Has tried OTC pain medication, PT course, muscle relaxants, specialty fitted bra for over 3 month trial without relief.   Wt fluctuates within few pounds.   MMG 01/2022 benign. Denies FH breast or ovarian ca.   PMH includes HTN, DM, HbA1c 7.2 12/2021. On Mounjaro.    Lives with spouse. 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   Review of Systems  Musculoskeletal: Positive for back pain and neck pain.    Remainder 12 point review negative    Objective:   Physical Exam Cardiovascular:     Rate and Rhythm: Normal rate and regular rhythm.     Heart sounds: Normal heart sounds.  Pulmonary:     Effort: Pulmonary effort is normal.     Breath sounds: Normal breath sounds.  Lymphadenopathy:     Upper Body:     Right upper body: No axillary adenopathy.     Left upper body: No axillary adenopathy.  Skin:    Comments: Fitzpatrick 4     +shoulder grooving Breasts: no masses grade 3 ptosis bilateral SN to nipple R 35.5 L 35.5 cm BW R 23 L 24 cm Nipple to IMF R 14 L 14 cm Multiple skin tags lower pole breasts upper abdomen    Assessment:     Macromastia Chronic neck and back pain Chronic shoulder pain    Plan:     Hold Mounjaro, ASA week prior to surgery.   Chronic neck and back pain, shoulder pain, that has failed conservative measures in setting of macromastia. No other cause of back pain noted. There is a reasonable likelihood that the symptoms are primarily due to macromastia; breast reduction surgery has reasonable expectation to improve symptoms.   Reviewed reduction with anchor type scars, OP surgery, drains, post operative visits and limitations, recovery. Diminished sensation nipple and breast skin, risk of nipple loss,  wound healing problems, asymmetry, incidental carcinoma, changes with wt gain/loss, aging, unacceptable cosmetic appearance reviewed. Cannot assure cup size.    Additional risks including but not limited to bleeding, hematoma, seroma, infection, need for additional surgery, damage to adjacent structures, blood clots in legs or lungs reviewed.   Anticipate 556 g reduction from each breast.    Drain teaching completed. Rx for tramadol given.

## 2022-04-04 NOTE — Progress Notes (Signed)
Patient given G2 presurgery drink for diabetic patients with written and verbal instruction. Patient given CHG soap with written and verbal instruction. Patient verbalized understanding.

## 2022-04-04 NOTE — Progress Notes (Addendum)
EKG reviewed by anesthesia. Per anesthesia ok for surgery scheduled on 04/10/2022

## 2022-04-09 ENCOUNTER — Other Ambulatory Visit: Payer: Self-pay

## 2022-04-09 DIAGNOSIS — E1165 Type 2 diabetes mellitus with hyperglycemia: Secondary | ICD-10-CM

## 2022-04-09 MED ORDER — ONETOUCH VERIO VI STRP: 1.0000 | ORAL_STRIP | Freq: Every day | 12 refills | Status: DC

## 2022-04-09 NOTE — Anesthesia Preprocedure Evaluation (Addendum)
Anesthesia Evaluation  Patient identified by MRN, date of birth, ID band Patient awake    Reviewed: Allergy & Precautions, NPO status , Patient's Chart, lab work & pertinent test results  Airway Mallampati: II  TM Distance: >3 FB Neck ROM: Full    Dental no notable dental hx. (+) Teeth Intact, Dental Advisory Given   Pulmonary neg pulmonary ROS   Pulmonary exam normal breath sounds clear to auscultation       Cardiovascular hypertension, Normal cardiovascular exam Rhythm:Regular Rate:Normal     Neuro/Psych negative neurological ROS     GI/Hepatic ,GERD  ,,  Endo/Other  diabetes    Renal/GU      Musculoskeletal negative musculoskeletal ROS (+)  Chronic back pain   Abdominal   Peds  Hematology  (+) Blood dyscrasia, anemia Lab Results      Component                Value               Date                            HGB                      12.4                04/04/2022                HCT                      37.5                04/04/2022                       PLT                      281                 04/04/2022              Anesthesia Other Findings All; ace inhibitor  Reproductive/Obstetrics                             Anesthesia Physical Anesthesia Plan  ASA: 3  Anesthesia Plan: General   Post-op Pain Management: Ketamine IV*, Tylenol PO (pre-op)* and Gabapentin PO (pre-op)*   Induction: Intravenous  PONV Risk Score and Plan: 4 or greater and Treatment may vary due to age or medical condition, Midazolam, Ondansetron, Dexamethasone and Scopolamine patch - Pre-op  Airway Management Planned: Oral ETT  Additional Equipment: None  Intra-op Plan:   Post-operative Plan: Extubation in OR  Informed Consent: I have reviewed the patients History and Physical, chart, labs and discussed the procedure including the risks, benefits and alternatives for the proposed anesthesia with the  patient or authorized representative who has indicated his/her understanding and acceptance.     Dental advisory given  Plan Discussed with:   Anesthesia Plan Comments: (Pt on Mounjaro)        Anesthesia Quick Evaluation

## 2022-04-10 ENCOUNTER — Other Ambulatory Visit: Payer: Self-pay

## 2022-04-10 ENCOUNTER — Encounter (HOSPITAL_BASED_OUTPATIENT_CLINIC_OR_DEPARTMENT_OTHER): Payer: Self-pay | Admitting: Plastic Surgery

## 2022-04-10 ENCOUNTER — Ambulatory Visit (HOSPITAL_BASED_OUTPATIENT_CLINIC_OR_DEPARTMENT_OTHER): Payer: POS | Admitting: Anesthesiology

## 2022-04-10 ENCOUNTER — Ambulatory Visit (HOSPITAL_BASED_OUTPATIENT_CLINIC_OR_DEPARTMENT_OTHER)
Admission: RE | Admit: 2022-04-10 | Discharge: 2022-04-10 | Disposition: A | Discharge status: Home / Self Care | Payer: POS | Attending: Plastic Surgery | Admitting: Plastic Surgery

## 2022-04-10 ENCOUNTER — Encounter (HOSPITAL_BASED_OUTPATIENT_CLINIC_OR_DEPARTMENT_OTHER)
Admission: RE | Disposition: A | Discharge status: Home / Self Care | Payer: Self-pay | Source: Home / Self Care | Attending: Plastic Surgery

## 2022-04-10 DIAGNOSIS — N62 Hypertrophy of breast: Secondary | ICD-10-CM | POA: Insufficient documentation

## 2022-04-10 DIAGNOSIS — Z7985 Long-term (current) use of injectable non-insulin antidiabetic drugs: Secondary | ICD-10-CM | POA: Diagnosis not present

## 2022-04-10 DIAGNOSIS — I10 Essential (primary) hypertension: Secondary | ICD-10-CM | POA: Insufficient documentation

## 2022-04-10 DIAGNOSIS — G8929 Other chronic pain: Secondary | ICD-10-CM | POA: Diagnosis not present

## 2022-04-10 DIAGNOSIS — M542 Cervicalgia: Secondary | ICD-10-CM | POA: Diagnosis not present

## 2022-04-10 DIAGNOSIS — N6012 Diffuse cystic mastopathy of left breast: Secondary | ICD-10-CM | POA: Diagnosis not present

## 2022-04-10 DIAGNOSIS — N6011 Diffuse cystic mastopathy of right breast: Secondary | ICD-10-CM | POA: Insufficient documentation

## 2022-04-10 DIAGNOSIS — D649 Anemia, unspecified: Secondary | ICD-10-CM | POA: Diagnosis not present

## 2022-04-10 DIAGNOSIS — M25519 Pain in unspecified shoulder: Secondary | ICD-10-CM | POA: Insufficient documentation

## 2022-04-10 DIAGNOSIS — K219 Gastro-esophageal reflux disease without esophagitis: Secondary | ICD-10-CM | POA: Insufficient documentation

## 2022-04-10 DIAGNOSIS — M549 Dorsalgia, unspecified: Secondary | ICD-10-CM | POA: Diagnosis not present

## 2022-04-10 DIAGNOSIS — E119 Type 2 diabetes mellitus without complications: Secondary | ICD-10-CM | POA: Insufficient documentation

## 2022-04-10 HISTORY — PX: BREAST REDUCTION SURGERY: SHX8

## 2022-04-10 LAB — GLUCOSE, CAPILLARY
Glucose-Capillary: 114 mg/dL — ABNORMAL HIGH (ref 70–99)
Glucose-Capillary: 134 mg/dL — ABNORMAL HIGH (ref 70–99)

## 2022-04-10 SURGERY — MAMMOPLASTY, REDUCTION
Anesthesia: General | Site: Breast | Laterality: Bilateral

## 2022-04-10 MED ORDER — ROCURONIUM BROMIDE 10 MG/ML (PF) SYRINGE
PREFILLED_SYRINGE | INTRAVENOUS | Status: AC
  Filled 2022-04-10: qty 10

## 2022-04-10 MED ORDER — HYDROMORPHONE HCL 1 MG/ML IJ SOLN
0.2500 mg | INTRAMUSCULAR | Status: DC | PRN
  Administered 2022-04-10 (×2): 0.25 mg via INTRAVENOUS

## 2022-04-10 MED ORDER — LIDOCAINE 2% (20 MG/ML) 5 ML SYRINGE
INTRAMUSCULAR | Status: AC
  Filled 2022-04-10: qty 5

## 2022-04-10 MED ORDER — KETAMINE HCL 10 MG/ML IJ SOLN
INTRAMUSCULAR | Status: DC | PRN
  Administered 2022-04-10: 30 mg via INTRAVENOUS

## 2022-04-10 MED ORDER — ACETAMINOPHEN 500 MG PO TABS
1000.0000 mg | ORAL_TABLET | ORAL | Status: AC
  Administered 2022-04-10: 1000 mg via ORAL

## 2022-04-10 MED ORDER — DEXAMETHASONE SODIUM PHOSPHATE 10 MG/ML IJ SOLN
INTRAMUSCULAR | Status: AC
  Filled 2022-04-10: qty 1

## 2022-04-10 MED ORDER — LIDOCAINE HCL (CARDIAC) PF 100 MG/5ML IV SOSY
PREFILLED_SYRINGE | INTRAVENOUS | Status: DC | PRN
  Administered 2022-04-10: 100 mg via INTRAVENOUS

## 2022-04-10 MED ORDER — LACTATED RINGERS IV SOLN: INTRAVENOUS | Status: DC

## 2022-04-10 MED ORDER — CELECOXIB 200 MG PO CAPS
200.0000 mg | ORAL_CAPSULE | ORAL | Status: AC
  Administered 2022-04-10: 200 mg via ORAL

## 2022-04-10 MED ORDER — ACETAMINOPHEN 10 MG/ML IV SOLN: 1000.0000 mg | Freq: Once | INTRAVENOUS | Status: DC | PRN

## 2022-04-10 MED ORDER — 0.9 % SODIUM CHLORIDE (POUR BTL) OPTIME
TOPICAL | Status: DC | PRN
  Administered 2022-04-10: 275 mL

## 2022-04-10 MED ORDER — BUPIVACAINE HCL (PF) 0.5 % IJ SOLN
INTRAMUSCULAR | Status: AC
  Filled 2022-04-10: qty 30

## 2022-04-10 MED ORDER — MIDAZOLAM HCL 2 MG/2ML IJ SOLN
INTRAMUSCULAR | Status: AC
  Filled 2022-04-10: qty 2

## 2022-04-10 MED ORDER — ONDANSETRON HCL 4 MG/2ML IJ SOLN: 4.0000 mg | Freq: Once | INTRAMUSCULAR | Status: DC | PRN

## 2022-04-10 MED ORDER — ROCURONIUM BROMIDE 100 MG/10ML IV SOLN
INTRAVENOUS | Status: DC | PRN
  Administered 2022-04-10: 45 mg via INTRAVENOUS

## 2022-04-10 MED ORDER — GABAPENTIN 300 MG PO CAPS
ORAL_CAPSULE | ORAL | Status: AC
  Filled 2022-04-10: qty 1

## 2022-04-10 MED ORDER — MIDAZOLAM HCL 5 MG/5ML IJ SOLN
INTRAMUSCULAR | Status: DC | PRN
  Administered 2022-04-10: 2 mg via INTRAVENOUS

## 2022-04-10 MED ORDER — OXYCODONE HCL 5 MG PO TABS
5.0000 mg | ORAL_TABLET | Freq: Once | ORAL | Status: AC | PRN
  Administered 2022-04-10: 5 mg via ORAL

## 2022-04-10 MED ORDER — BUPIVACAINE HCL (PF) 0.5 % IJ SOLN
INTRAMUSCULAR | Status: DC | PRN
  Administered 2022-04-10: 30 mL

## 2022-04-10 MED ORDER — ACETAMINOPHEN 500 MG PO TABS
ORAL_TABLET | ORAL | Status: AC
  Filled 2022-04-10: qty 2

## 2022-04-10 MED ORDER — HYDROMORPHONE HCL 1 MG/ML IJ SOLN
INTRAMUSCULAR | Status: AC
  Filled 2022-04-10: qty 0.5

## 2022-04-10 MED ORDER — ONDANSETRON HCL 4 MG/2ML IJ SOLN
INTRAMUSCULAR | Status: DC | PRN
  Administered 2022-04-10: 4 mg via INTRAVENOUS

## 2022-04-10 MED ORDER — SUGAMMADEX SODIUM 200 MG/2ML IV SOLN
INTRAVENOUS | Status: DC | PRN
  Administered 2022-04-10: 175 mg via INTRAVENOUS

## 2022-04-10 MED ORDER — CEFAZOLIN SODIUM-DEXTROSE 2-4 GM/100ML-% IV SOLN
2.0000 g | INTRAVENOUS | Status: AC
  Administered 2022-04-10: 2 g via INTRAVENOUS

## 2022-04-10 MED ORDER — DEXMEDETOMIDINE HCL IN NACL 80 MCG/20ML IV SOLN
INTRAVENOUS | Status: DC | PRN
  Administered 2022-04-10: 12 ug via BUCCAL

## 2022-04-10 MED ORDER — FENTANYL CITRATE (PF) 100 MCG/2ML IJ SOLN
INTRAMUSCULAR | Status: DC | PRN
  Administered 2022-04-10 (×2): 50 ug via INTRAVENOUS

## 2022-04-10 MED ORDER — CEFAZOLIN SODIUM-DEXTROSE 2-4 GM/100ML-% IV SOLN
INTRAVENOUS | Status: AC
  Filled 2022-04-10: qty 100

## 2022-04-10 MED ORDER — PROPOFOL 10 MG/ML IV BOLUS
INTRAVENOUS | Status: DC | PRN
  Administered 2022-04-10: 150 mg via INTRAVENOUS

## 2022-04-10 MED ORDER — AMISULPRIDE (ANTIEMETIC) 5 MG/2ML IV SOLN
INTRAVENOUS | Status: AC
  Filled 2022-04-10: qty 2

## 2022-04-10 MED ORDER — CHLORHEXIDINE GLUCONATE CLOTH 2 % EX PADS: 6.0000 | MEDICATED_PAD | Freq: Once | CUTANEOUS | Status: DC

## 2022-04-10 MED ORDER — CELECOXIB 200 MG PO CAPS
ORAL_CAPSULE | ORAL | Status: AC
  Filled 2022-04-10: qty 1

## 2022-04-10 MED ORDER — AMISULPRIDE (ANTIEMETIC) 5 MG/2ML IV SOLN
10.0000 mg | Freq: Once | INTRAVENOUS | Status: AC | PRN
  Administered 2022-04-10: 10 mg via INTRAVENOUS

## 2022-04-10 MED ORDER — FENTANYL CITRATE (PF) 100 MCG/2ML IJ SOLN
INTRAMUSCULAR | Status: AC
  Filled 2022-04-10: qty 2

## 2022-04-10 MED ORDER — PROPOFOL 10 MG/ML IV BOLUS
INTRAVENOUS | Status: AC
  Filled 2022-04-10: qty 20

## 2022-04-10 MED ORDER — OXYCODONE HCL 5 MG/5ML PO SOLN: 5.0000 mg | Freq: Once | ORAL | Status: AC | PRN

## 2022-04-10 MED ORDER — GABAPENTIN 300 MG PO CAPS
300.0000 mg | ORAL_CAPSULE | ORAL | Status: AC
  Administered 2022-04-10: 300 mg via ORAL

## 2022-04-10 MED ORDER — DEXAMETHASONE SODIUM PHOSPHATE 4 MG/ML IJ SOLN
INTRAMUSCULAR | Status: DC | PRN
  Administered 2022-04-10: 10 mg via INTRAVENOUS

## 2022-04-10 MED ORDER — ONDANSETRON HCL 4 MG/2ML IJ SOLN
INTRAMUSCULAR | Status: AC
  Filled 2022-04-10: qty 2

## 2022-04-10 MED ORDER — LIDOCAINE HCL (CARDIAC) PF 100 MG/5ML IV SOSY: PREFILLED_SYRINGE | INTRAVENOUS | Status: DC | PRN

## 2022-04-10 MED ORDER — OXYCODONE HCL 5 MG PO TABS
ORAL_TABLET | ORAL | Status: AC
  Filled 2022-04-10: qty 1

## 2022-04-10 MED ORDER — KETAMINE HCL 50 MG/5ML IJ SOSY
PREFILLED_SYRINGE | INTRAMUSCULAR | Status: AC
  Filled 2022-04-10: qty 5

## 2022-04-10 SURGICAL SUPPLY — 51 items
ADH SKN CLS APL DERMABOND .7 (GAUZE/BANDAGES/DRESSINGS) ×2
APL PRP STRL LF DISP 70% ISPRP (MISCELLANEOUS) ×2
BINDER BREAST 3XL (GAUZE/BANDAGES/DRESSINGS) IMPLANT
BINDER BREAST LRG (GAUZE/BANDAGES/DRESSINGS) IMPLANT
BINDER BREAST MEDIUM (GAUZE/BANDAGES/DRESSINGS) IMPLANT
BINDER BREAST XLRG (GAUZE/BANDAGES/DRESSINGS) IMPLANT
BINDER BREAST XXLRG (GAUZE/BANDAGES/DRESSINGS) IMPLANT
BLADE SURG 10 STRL SS (BLADE) ×8 IMPLANT
BNDG GAUZE DERMACEA FLUFF 4 (GAUZE/BANDAGES/DRESSINGS) ×4 IMPLANT
BNDG GZE DERMACEA 4 6PLY (GAUZE/BANDAGES/DRESSINGS) ×2
CANISTER SUCT 1200ML W/VALVE (MISCELLANEOUS) ×2 IMPLANT
CHLORAPREP W/TINT 26 (MISCELLANEOUS) ×4 IMPLANT
COVER BACK TABLE 60X90IN (DRAPES) ×2 IMPLANT
COVER MAYO STAND STRL (DRAPES) ×2 IMPLANT
DERMABOND ADVANCED .7 DNX12 (GAUZE/BANDAGES/DRESSINGS) ×4 IMPLANT
DRAIN CHANNEL 15F RND FF W/TCR (WOUND CARE) IMPLANT
DRAIN CHANNEL 19F RND (DRAIN) IMPLANT
DRAPE TOP ARMCOVERS (MISCELLANEOUS) ×2 IMPLANT
DRAPE U-SHAPE 76X120 STRL (DRAPES) ×2 IMPLANT
DRAPE UTILITY XL STRL (DRAPES) ×2 IMPLANT
ELECT COATED BLADE 2.86 ST (ELECTRODE) ×2 IMPLANT
ELECT REM PT RETURN 9FT ADLT (ELECTROSURGICAL) ×1
ELECTRODE REM PT RTRN 9FT ADLT (ELECTROSURGICAL) ×2 IMPLANT
EVACUATOR SILICONE 100CC (DRAIN) IMPLANT
GAUZE PAD ABD 8X10 STRL (GAUZE/BANDAGES/DRESSINGS) ×4 IMPLANT
GLOVE BIO SURGEON STRL SZ 6 (GLOVE) ×4 IMPLANT
GOWN STRL REUS W/ TWL LRG LVL3 (GOWN DISPOSABLE) ×4 IMPLANT
GOWN STRL REUS W/TWL LRG LVL3 (GOWN DISPOSABLE) ×2
MARKER SKIN DUAL TIP RULER LAB (MISCELLANEOUS) IMPLANT
NDL HYPO 25X1 1.5 SAFETY (NEEDLE) ×2 IMPLANT
NEEDLE HYPO 25X1 1.5 SAFETY (NEEDLE) ×1 IMPLANT
NS IRRIG 1000ML POUR BTL (IV SOLUTION) ×2 IMPLANT
PACK BASIN DAY SURGERY FS (CUSTOM PROCEDURE TRAY) ×2 IMPLANT
PENCIL SMOKE EVACUATOR (MISCELLANEOUS) ×2 IMPLANT
PIN SAFETY STERILE (MISCELLANEOUS) ×2 IMPLANT
SHEET MEDIUM DRAPE 40X70 STRL (DRAPES) ×4 IMPLANT
SLEEVE SCD COMPRESS KNEE MED (STOCKING) ×2 IMPLANT
SPONGE T-LAP 18X18 ~~LOC~~+RFID (SPONGE) ×6 IMPLANT
STAPLER VISISTAT 35W (STAPLE) ×2 IMPLANT
SUT ETHILON 2 0 FS 18 (SUTURE) IMPLANT
SUT MNCRL AB 4-0 PS2 18 (SUTURE) IMPLANT
SUT VIC AB 3-0 PS1 18 (SUTURE) ×7
SUT VIC AB 3-0 PS1 18XBRD (SUTURE) IMPLANT
SUT VICRYL 4-0 PS2 18IN ABS (SUTURE) IMPLANT
SUT VLOC 180 P-14 24 (SUTURE) ×4 IMPLANT
SYR BULB IRRIG 60ML STRL (SYRINGE) ×2 IMPLANT
SYR CONTROL 10ML LL (SYRINGE) ×2 IMPLANT
TOWEL GREEN STERILE FF (TOWEL DISPOSABLE) ×4 IMPLANT
TUBE CONNECTING 20X1/4 (TUBING) ×2 IMPLANT
UNDERPAD 30X36 HEAVY ABSORB (UNDERPADS AND DIAPERS) ×4 IMPLANT
YANKAUER SUCT BULB TIP NO VENT (SUCTIONS) ×2 IMPLANT

## 2022-04-10 NOTE — Op Note (Addendum)
Operative Note   DATE OF OPERATION: 12.5.23  LOCATION: [facility]-outpatient  SURGICAL DIVISION: Plastic Surgery  PREOPERATIVE DIAGNOSES:  1. Macromastia 2. Chronic neck and back pain 3. Chronic shoulder pain  POSTOPERATIVE DIAGNOSES:  same  PROCEDURE:  Bilateral breast reduction  SURGEON: Irene Limbo MD MBA  ASSISTANT: none  ANESTHESIA:  General.   EBL: 50 ml  COMPLICATIONS: None immediate.   INDICATIONS FOR PROCEDURE:  The patient, Heidi Howell, is a 66 y.o. female born on Mar 27, 1956, is here for treatment chronic neck back shoulder pain in setting of macromastia that has failed conservative measures   FINDINGS: Right reduction 563 g Left reduction 604 g  DESCRIPTION OF PROCEDURE:  The patient was marked standing in the preoperative area to mark sternal notch, chest midline, anterior axillary lines, inframammary folds. The location of new nipple areolar complex was marked at level of on inframammary fold on anterior surface breast by palpation. This was marked symmetric over bilateral breasts. With aid of Wise pattern marker, location of new nipple areolar complex and vertical limbs 7 cm) were marked by displacement of breasts along meridian. The patient was taken to the operating room. SCDs were placed and IV antibiotics were given. The patient's operative site was prepped and draped in a sterile fashion. A time out was performed and all information was confirmed to be correct.     I began on left breast. Over left breast, superomedial pedicle marked and nipple areolar complex incised with 38 mm diameter marker. Pedicle deepithlialized and developed to chest wall. Breast tissue resected over lower pole. Medial and lateral flaps developed. Additional lateral breast tissue excised. Breast tailor tacked closed.    I then directed attention to right breast where superomedial pedicle designed. NAC incised with 38 mm diameter marker. The pedicle was deepithelialized.  Pedicle developed to chest wall. Breast tissue resected over lower pole. Medial and lateral flaps developed. Additional lateral breast tissue excised. Breast tailor tacked closed. Patient assessed for symmetry. Breast cavities irrigated and hemostasis obtained. Local anesthetic infiltrated throughout each breast. 15 Fr JP placed in each breast and secured with 2-0 nylon. Closure completed bilateral with 3-0 vicryl to approximate dermis along inframammary fold and vertical limb. NAC inset with 4-0 vicryl in dermis. Skin closure completed with 4-0 monocryl subcuticular along NAC and vertical closure. Along inframammary fold closure 3-0 V Lock suture used bilateral. Tissue adhesive applied. Dry dressing and breast binder applied.   The patient was allowed to wake from anesthesia, extubated and taken to the recovery room in satisfactory condition.   SPECIMENS: right and left breast reduction  DRAINS: 15 Fr JP in right and left breast  Irene Limbo, MD [facility] Plastic & Reconstructive Surgery  Office/ physician access line after hours [phone]

## 2022-04-10 NOTE — Interval H&P Note (Signed)
History and Physical Interval Note:  04/10/2022 8:32 AM  Heidi Howell  has presented today for surgery, with the diagnosis of macromastia, chronic neck and back pain, shoulder pain.  The various methods of treatment have been discussed with the patient and family. After consideration of risks, benefits and other options for treatment, the patient has consented to  Procedure(s): MAMMARY REDUCTION  (BREAST) (Bilateral) as a surgical intervention.  The patient's history has been reviewed, patient examined, no change in status, stable for surgery.  I have reviewed the patient's chart and labs.  Questions were answered to the patient's satisfaction.     Arnoldo Hooker Wrigley [place]borne

## 2022-04-10 NOTE — Anesthesia Procedure Notes (Signed)
Procedure Name: Intubation Date/Time: 04/10/2022 9:19 AM  Performed by: Glory Buff, CRNAPre-anesthesia Checklist: Patient identified, Emergency Drugs available, Suction available and Patient being monitored Patient Re-evaluated:Patient Re-evaluated prior to induction Oxygen Delivery Method: Circle system utilized Preoxygenation: Pre-oxygenation with 100% oxygen Induction Type: IV induction Ventilation: Mask ventilation without difficulty Laryngoscope Size: Miller and 3 Grade View: Grade I Tube type: Oral Tube size: 7.0 mm Number of attempts: 1 Airway Equipment and Method: Stylet and Oral airway Placement Confirmation: ETT inserted through vocal cords under direct vision, positive ETCO2 and breath sounds checked- equal and bilateral Secured at: 20 cm Tube secured with: Tape Dental Injury: Teeth and Oropharynx as per pre-operative assessment

## 2022-04-10 NOTE — Discharge Instructions (Addendum)
  Post Anesthesia Home Care Instructions  Activity: Get plenty of rest for the remainder of the day. A responsible individual must stay with you for 24 hours following the procedure.  For the next 24 hours, DO NOT: -Drive a car -Paediatric nurse -Drink alcoholic beverages -Take any medication unless instructed by your physician -Make any legal decisions or sign important papers.  Meals: Start with liquid foods such as gelatin or soup. Progress to regular foods as tolerated. Avoid greasy, spicy, heavy foods. If nausea and/or vomiting occur, drink only clear liquids until the nausea and/or vomiting subsides. Call your physician if vomiting continues.  Special Instructions/Symptoms: Your throat may feel dry or sore from the anesthesia or the breathing tube placed in your throat during surgery. If this causes discomfort, gargle with warm salt water. The discomfort should disappear within 24 hours.  May take Tylenol if needed after 2:30pm.  About my Jackson-Pratt Bulb Drain  What is a Jackson-Pratt bulb? A Jackson-Pratt is a soft, round device used to collect drainage. It is connected to a long, thin drainage catheter, which is held in place by one or two small stiches near your surgical incision site. When the bulb is squeezed, it forms a vacuum, forcing the drainage to empty into the bulb.  Emptying the Jackson-Pratt bulb- To empty the bulb: 1. Release the plug on the top of the bulb. 2. Pour the bulb's contents into a measuring container which your nurse will provide. 3. Record the time emptied and amount of drainage. Empty the drain(s) as often as your     doctor or nurse recommends.  Date                  Time                    Amount (Drain 1)                 Amount (Drain  2)  _____________________________________________________________________  _____________________________________________________________________  _____________________________________________________________________  _____________________________________________________________________  _____________________________________________________________________  _____________________________________________________________________  _____________________________________________________________________  _____________________________________________________________________  Squeezing the Jackson-Pratt Bulb- To squeeze the bulb: 1. Make sure the plug at the top of the bulb is open. 2. Squeeze the bulb tightly in your fist. You will hear air squeezing from the bulb. 3. Replace the plug while the bulb is squeezed. 4. Use a safety pin to attach the bulb to your clothing. This will keep the catheter from     pulling at the bulb insertion site.  When to call your doctor- Call your doctor if: Drain site becomes red, swollen or hot. You have a fever greater than 101 degrees F. There is oozing at the drain site. Drain falls out (apply a guaze bandage over the drain hole and secure it with tape). Drainage increases daily not related to activity patterns. (You will usually have more drainage when you are active than when you are resting.) Drainage has a bad odor.

## 2022-04-10 NOTE — Transfer of Care (Signed)
Immediate Anesthesia Transfer of Care Note  Patient: Heidi Howell  Procedure(s) Performed: MAMMARY REDUCTION  (BREAST) (Bilateral: Breast)  Patient Location: PACU  Anesthesia Type:General  Level of Consciousness: drowsy, patient cooperative, and responds to stimulation  Airway & Oxygen Therapy: Patient Spontanous Breathing and Patient connected to face mask oxygen  Post-op Assessment: Report given to RN and Post -op Vital signs reviewed and stable  Post vital signs: Reviewed and stable  Last Vitals:  Vitals Value Taken Time  BP 167/78 04/10/22 1226  Temp    Pulse 76 04/10/22 1227  Resp 14 04/10/22 1227  SpO2 99 % 04/10/22 1227  Vitals shown include unvalidated device data.  Last Pain:  Vitals:   04/10/22 0821  TempSrc: Oral  PainSc: 0-No pain         Complications: No notable events documented.

## 2022-04-10 NOTE — Anesthesia Postprocedure Evaluation (Signed)
Anesthesia Post Note  Patient: Heidi Howell  Procedure(s) Performed: MAMMARY REDUCTION  (BREAST) (Bilateral: Breast)     Patient location during evaluation: PACU Anesthesia Type: General Level of consciousness: awake and alert Pain management: pain level controlled Vital Signs Assessment: post-procedure vital signs reviewed and stable Respiratory status: spontaneous breathing, nonlabored ventilation, respiratory function stable and patient connected to nasal cannula oxygen Cardiovascular status: blood pressure returned to baseline and stable Postop Assessment: no apparent nausea or vomiting Anesthetic complications: no  No notable events documented.  Last Vitals:  Vitals:   04/10/22 1300 04/10/22 1345  BP: (!) 148/75 (!) 153/73  Pulse: 67 68  Resp: 11 14  Temp:  (!) 36.4 C  SpO2: 95% 98%    Last Pain:  Vitals:   04/10/22 1345  TempSrc:   PainSc: 6                  Barnet Glasgow

## 2022-04-11 ENCOUNTER — Encounter (HOSPITAL_BASED_OUTPATIENT_CLINIC_OR_DEPARTMENT_OTHER): Payer: Self-pay | Admitting: Plastic Surgery

## 2022-04-12 LAB — SURGICAL PATHOLOGY

## 2022-04-23 ENCOUNTER — Ambulatory Visit: Payer: POS | Admitting: Dietician

## 2022-05-02 ENCOUNTER — Encounter: Payer: Self-pay | Admitting: Family Medicine

## 2022-05-02 ENCOUNTER — Ambulatory Visit (INDEPENDENT_AMBULATORY_CARE_PROVIDER_SITE_OTHER): Payer: POS | Admitting: Family Medicine

## 2022-05-02 VITALS — BP 108/71 | HR 79 | Ht 64.0 in | Wt 173.9 lb

## 2022-05-02 DIAGNOSIS — Z6829 Body mass index (BMI) 29.0-29.9, adult: Secondary | ICD-10-CM | POA: Diagnosis not present

## 2022-05-02 DIAGNOSIS — I10 Essential (primary) hypertension: Secondary | ICD-10-CM

## 2022-05-02 DIAGNOSIS — Z7982 Long term (current) use of aspirin: Secondary | ICD-10-CM

## 2022-05-02 DIAGNOSIS — E119 Type 2 diabetes mellitus without complications: Secondary | ICD-10-CM

## 2022-05-02 DIAGNOSIS — Z0000 Encounter for general adult medical examination without abnormal findings: Secondary | ICD-10-CM | POA: Diagnosis not present

## 2022-05-02 DIAGNOSIS — F5101 Primary insomnia: Secondary | ICD-10-CM

## 2022-05-02 MED ORDER — ZOLPIDEM TARTRATE 10 MG PO TABS: 10.0000 mg | ORAL_TABLET | Freq: Every evening | ORAL | 0 refills | Status: DC | PRN

## 2022-05-02 NOTE — Assessment & Plan Note (Signed)
Reviewed sleep schedule and importance of sleep hygiene  Patient previously on ambien for >10 years  Discussed dosing and SE of medication and risks in patients 22 and above, patient denied previous hx of adverse effects previously  Will restart patient on prior dose '10mg'$  nightly  She is agreeable to non-opioid medication contract, signed today to be filed in patient's chart  Will follow up in 3 months to reassess efficacy

## 2022-05-02 NOTE — Assessment & Plan Note (Signed)
BP in goal range today  She will continue current medications at current doses  CMP today

## 2022-05-02 NOTE — Patient Instructions (Signed)

## 2022-05-02 NOTE — Progress Notes (Signed)
I,[name],acting as a Education administrator for [employer], MD.,have documented all relevant documentation on the behalf of Heidi Foster, MD,as directed by  Heidi Foster, MD while in the presence of Heidi Foster, MD.   Complete physical exam   Patient: Heidi Howell   DOB: 19-Jan-1956   66 y.o. Female  MRN: [id number] Visit Date: 05/02/2022  Today's healthcare provider: [name], MD   No chief complaint on file.  Subjective    [name] is a 66 y.o. female who presents today for a complete physical exam.  She reports consuming a general diet.  The patient reports she has not exercised lately but when she is able to she will go walking two to three times a week for a least twenty to thirty minutes.  She generally feels well. She reports sleeping poorly. She does have additional problems to discuss today.   HPI  -Mammogram: Sept or Oct 23 // Record request sent  -Diabetic Eye Exam: Sept 23 // record request sent -Pap Smear: Sept or Oct 23 // record request sent -HIV Screening: declined   Insomnia  -Patient report she feels as if her trazodone is not assisting her with sleeping -she states that she tries to go to bed around 9PM, sleeps for one hour and then is awake again at 4:20 AM and reports feeling that she is unable to get full night's rest  She states that she slept well while on [place], she was on Ambien since 2008 until she ran out of medication/did not have physician for 6 months  Last RX filled in July of this year per PDMP review   Past Medical History:  Diagnosis Date   Anemia    Diabetes mellitus without complication ([place])    Fibroids    Glaucoma    H/O: obesity    Hypertension    Varicose vein    Past Surgical History:  Procedure Laterality Date   BARTHOLIN GLAND CYST EXCISION  2010   BREAST REDUCTION SURGERY Bilateral 04/10/2022   Procedure: MAMMARY REDUCTION  (BREAST);  Surgeon: Irene Limbo, MD;  Location: [facility];  Service: Plastics;  Laterality: Bilateral;   CESAREAN SECTION  3149,7026   DILATION AND CURETTAGE OF UTERUS  1989   TUBAL LIGATION  1996   bilateral   Social History   Socioeconomic History   Marital status: Married    Spouse name: Madelaine Etienne   Number of children: Not on file   Years of education: Not on file   Highest education level: Not on file  Occupational History   Not on file  Tobacco Use   Smoking status: Never    Passive exposure: Never   Smokeless tobacco: Never  Vaping Use   Vaping Use: Never used  Substance and Sexual Activity   Alcohol use: No   Drug use: No   Sexual activity: Yes    Partners: Male    Birth control/protection: Post-menopausal    Comment: 1st intercourse 28 yo-5 partners  Other Topics Concern   Not on file  Social History Narrative   Exercise walk 2-3 times for 30-40 minutes   Social Determinants of Health   Financial Resource Strain: Not on file  Food Insecurity: Not on file  Transportation Needs: Not on file  Physical Activity: Not on file  Stress: Not on file  Social Connections: Not on file  Intimate Partner Violence: Not on file   Family Status  Relation Name Status   Father  Heidi Howell Deceased   Mother Heidi Howell Deceased   MGM Heidi Howell Deceased   MGF Heidi Howell Deceased   Sister Constance Holster Alive   Brother Grayland Ormond Alive   Brother  Alive   Sister [place] (Not Specified)   Family History  Problem Relation Age of Onset   Cancer Father        colon   Cancer Mother        per patient stomach   Diabetes Maternal Grandmother    Diabetes Maternal Grandfather    Mental illness Sister    Stroke Sister    Heart disease Brother    Diabetes Brother    Heart disease Sister    Stroke Paternal Aunt    Allergies  Allergen Reactions   Ace Inhibitors Cough    Patient Care Team: Heidi Foster, MD as PCP - General (Family Medicine) Juanita Craver, MD as  Consulting Physician (Gastroenterology) Renato Shin, MD (Inactive) as Consulting Physician (Endocrinology) Dunn, Warnell Bureau (Optometry) Ob/Gyn, [facility] (Obstetrics and Gynecology)   Medications: Outpatient Medications Prior to Visit  Medication Sig   aspirin 81 MG tablet Take 81 mg by mouth daily.   carvedilol (COREG) 25 MG tablet Take 1 tablet (25 mg total) by mouth daily.   dorzolamide-timolol (COSOPT) 2-0.5 % ophthalmic solution Place 1 drop into both eyes 2 (two) times daily.   empagliflozin (JARDIANCE) 25 MG TABS tablet Take 1 tablet (25 mg total) by mouth daily.   glucose blood (ONETOUCH VERIO) test strip 1 each by Other route daily. And lancets 1/day   hydrochlorothiazide (HYDRODIURIL) 25 MG tablet Take 1 tablet (25 mg total) by mouth daily.   metFORMIN (GLUCOPHAGE-XR) 500 MG 24 hr tablet Take 2 tablets (1,000 mg total) by mouth daily with breakfast.   montelukast (SINGULAIR) 10 MG tablet TAKE 1 TABLET AT BEDTIME (OFFICE VISIT NEEDED FOR ADDITIONAL REFILLS)   Multiple Vitamin (MULTIVITAMIN) tablet Take 1 tablet by mouth daily.   nebivolol (BYSTOLIC) 10 MG tablet Take 1 tablet (10 mg total) by mouth daily.   Netarsudil Dimesylate 0.02 % SOLN Apply to eye. Instill 1 drop in both eyes every evening   repaglinide (PRANDIN) 2 MG tablet TAKE 1 TABLET THREE TIMES A DAY BEFORE MEALS   telmisartan (MICARDIS) 80 MG tablet TAKE 1 TABLET DAILY   tirzepatide (MOUNJARO) 10 MG/0.5ML Pen Inject 10 mg into the skin once a week.   [DISCONTINUED] traZODone (DESYREL) 50 MG tablet Take 0.5-1 tablets (25-50 mg total) by mouth at bedtime as needed for sleep.   [DISCONTINUED] zolpidem (AMBIEN) 10 MG tablet Take 1 tablet (10 mg total) by mouth at bedtime as needed for sleep.   No facility-administered medications prior to visit.    Review of Systems    Objective    BP 108/71 (BP Location: Left Arm, Patient Position: Sitting, Cuff Size: Normal)   Pulse 79   Ht _0  (1.626 m)   Wt 173 lb  14.4 oz (78.9 kg)   LMP 01/15/2011   BMI 29.85 kg/m     Physical Exam Vitals reviewed.  Constitutional:      General: She is not in acute distress.    Appearance: She is not ill-appearing, toxic-appearing or diaphoretic.     Comments: Tired appearing   HENT:     Head: Normocephalic and atraumatic.     Right Ear: Tympanic membrane and external ear normal. There is no impacted cerumen.     Left Ear: Tympanic membrane and external ear normal. There  is no impacted cerumen.     Nose: Nose normal.     Mouth/Throat:     Pharynx: Oropharynx is clear.  Eyes:     General: No scleral icterus.    Extraocular Movements: Extraocular movements intact.     Conjunctiva/sclera: Conjunctivae normal.     Pupils: Pupils are equal, round, and reactive to light.  Cardiovascular:     Rate and Rhythm: Normal rate and regular rhythm.     Pulses: Normal pulses.     Heart sounds: Normal heart sounds. No murmur heard.    No friction rub. No gallop.  Pulmonary:     Effort: Pulmonary effort is normal. No respiratory distress.     Breath sounds: Normal breath sounds. No wheezing, rhonchi or rales.  Abdominal:     General: Bowel sounds are normal. There is no distension.     Palpations: Abdomen is soft. There is no mass.     Tenderness: There is no abdominal tenderness. There is no guarding.  Musculoskeletal:        General: No deformity.     Cervical back: Normal range of motion and neck supple. No rigidity.     Right lower leg: No edema.     Left lower leg: No edema.  Lymphadenopathy:     Cervical: No cervical adenopathy.  Skin:    General: Skin is warm.     Capillary Refill: Capillary refill takes less than 2 seconds.     Findings: No erythema or rash.  Neurological:     General: No focal deficit present.     Mental Status: She is alert and oriented to person, place, and time.     Motor: No weakness.     Gait: Gait normal.  Psychiatric:        Mood and Affect: Mood normal.        Behavior:  Behavior normal.       Last depression screening scores    02/09/2022   10:38 AM 04/03/2021    1:33 PM 09/14/2019   11:40 AM  PHQ 2/9 Scores  PHQ - 2 Score 0 2 0  PHQ- 9 Score  8    Last fall risk screening    02/09/2022   10:38 AM  [place] in the past year? 0  Number falls in past yr: 0  Injury with Fall? 0  Risk for fall due to : No Fall Risks  Follow up Falls evaluation completed   Last Audit-C alcohol use screening     No data to display         A score of 3 or more in women, and 4 or more in men indicates increased risk for alcohol abuse, EXCEPT if all of the points are from question 1   No results found for any visits on 05/02/22.  Assessment & Plan    Routine Health Maintenance and Physical Exam  Exercise Activities and Dietary recommendations  Goals   None     Immunization History  Administered Date(s) Administered   Influenza, Seasonal, Injecte, Preservative Fre 05/29/2012   Influenza,inj,Quad PF,6+ Mos 02/26/2013, 01/14/2014, 04/14/2015, 02/21/2017, 04/28/2018, 03/31/2019, 02/27/2022   Influenza-Unspecified 02/23/2017   Moderna Sars-Covid-2 Vaccination 06/29/2019, 08/04/2019   Pneumococcal Conjugate-13 02/22/2014   Pneumococcal Polysaccharide-23 02/21/2017   Pneumococcal-Unspecified 12/30/2008, 05/07/2010   Tdap 11/20/2016    Health Maintenance  Topic Date Due   COVID-19 Vaccine (3 - Moderna risk series) 09/01/2019   PAP SMEAR-Modifier  10/17/2019   OPHTHALMOLOGY  EXAM  07/23/2020   MAMMOGRAM  11/24/2020   Zoster Vaccines- Shingrix (1 of 2) 05/12/2022 (Originally 07/19/1974)   HIV Screening  05/03/2023 (Originally 07/19/1970)   HEMOGLOBIN A1C  06/08/2022   Diabetic kidney evaluation - Urine ACR  12/07/2022   FOOT EXAM  12/09/2022   Diabetic kidney evaluation - eGFR measurement  04/05/2023   DTaP/Tdap/Td (2 - Td or Tdap) 11/21/2026   COLONOSCOPY (Pts 45-71yr Insurance coverage will need to be confirmed)  02/13/2029   INFLUENZA  VACCINE  Completed   Hepatitis C Screening  Completed   HPV VACCINES  Aged Out    Discussed health benefits of physical activity, and encouraged her to engage in regular exercise appropriate for her age and condition.  Problem List Items Addressed This Visit       Cardiovascular and Mediastinum   Essential hypertension    BP in goal range today  She will continue current medications at current doses  CMP today       Relevant Orders   Lipid panel   Comprehensive Metabolic Panel (CMET)     Endocrine   Type 2 diabetes mellitus without complication, without long-term current use of insulin (HCC)    Repeat A1c today  Patient reports completed DM eye exam  No changes to diabetes regimen Continue metformin 10075mdaily, mounjaro 1050meeky and jardiance 81m84mily  Follow up in 3 months        Relevant Orders   HgB A1c   Lipid panel     Other   Insomnia    Reviewed sleep schedule and importance of sleep hygiene  Patient previously on ambien for >10 years  Discussed dosing and SE of medication and risks in patients 65 a77 above, patient denied previous hx of adverse effects previously  Will restart patient on prior dose 10mg71mhtly  She is agreeable to non-opioid medication contract, signed today to be filed in patient's chart  Will follow up in 3 months to reassess efficacy        Relevant Medications   zolpidem (AMBIEN) 10 MG tablet   Long-term use of aspirin therapy    Will obtain CBC       Relevant Orders   CBC with Differential/Platelet   Annual physical exam - Primary    Patient declines HIV screening  She reports being up to date on pap, DM eye exam, mammogram as of 2023  She reports she is up to date for COVID and influenza vaccine  Recommended RSV vaccine today       BMI 29.0-29.9,adult    Lipid panel  Patient has lost 2 pounds since last visit on mounjaro  Encouraged regular physical activity to help with weight management       Relevant Orders    Lipid panel     Return in about 3 months (around 08/01/2022) for ambieWestern & [employer] I, MakieEulis Howell have reviewed all documentation for this visit.  Portions of this information were initially documented by the CMA and reviewed by me for thoroughness and accuracy.      MakieEulis Howell BurliHar[facility] [phone]) [phone])  [facility]

## 2022-05-02 NOTE — Assessment & Plan Note (Signed)
Lipid panel  Patient has lost 2 pounds since last visit on mounjaro  Encouraged regular physical activity to help with weight management

## 2022-05-02 NOTE — Assessment & Plan Note (Signed)
Will obtain CBC

## 2022-05-02 NOTE — Assessment & Plan Note (Signed)
Repeat A1c today  Patient reports completed DM eye exam  No changes to diabetes regimen Continue metformin '1000mg'$  daily, mounjaro '10mg'$  weeky and jardiance '25mg'$  daily  Follow up in 3 months

## 2022-05-02 NOTE — Assessment & Plan Note (Signed)
Patient declines HIV screening  She reports being up to date on pap, DM eye exam, mammogram as of 2023  She reports she is up to date for COVID and influenza vaccine  Recommended RSV vaccine today

## 2022-05-03 LAB — CBC WITH DIFFERENTIAL/PLATELET
Basophils Absolute: 0.1 10*3/uL (ref 0.0–0.2)
Basos: 1 %
EOS (ABSOLUTE): 0.3 10*3/uL (ref 0.0–0.4)
Eos: 5 %
Hematocrit: 35.2 % (ref 34.0–46.6)
Hemoglobin: 11.4 g/dL (ref 11.1–15.9)
Immature Grans (Abs): 0 10*3/uL (ref 0.0–0.1)
Immature Granulocytes: 0 %
Lymphocytes Absolute: 2.6 10*3/uL (ref 0.7–3.1)
Lymphs: 42 %
MCH: 26.5 pg — ABNORMAL LOW (ref 26.6–33.0)
MCHC: 32.4 g/dL (ref 31.5–35.7)
MCV: 82 fL (ref 79–97)
Monocytes Absolute: 0.6 10*3/uL (ref 0.1–0.9)
Monocytes: 10 %
Neutrophils Absolute: 2.6 10*3/uL (ref 1.4–7.0)
Neutrophils: 42 %
Platelets: 301 10*3/uL (ref 150–450)
RBC: 4.3 x10E6/uL (ref 3.77–5.28)
RDW: 12.3 % (ref 11.7–15.4)
WBC: 6.1 10*3/uL (ref 3.4–10.8)

## 2022-05-03 LAB — COMPREHENSIVE METABOLIC PANEL
ALT: 15 IU/L (ref 0–32)
AST: 16 IU/L (ref 0–40)
Albumin/Globulin Ratio: 1.6 (ref 1.2–2.2)
Albumin: 4.1 g/dL (ref 3.9–4.9)
Alkaline Phosphatase: 84 IU/L (ref 44–121)
BUN/Creatinine Ratio: 12 (ref 12–28)
BUN: 9 mg/dL (ref 8–27)
Bilirubin Total: 0.3 mg/dL (ref 0.0–1.2)
CO2: 24 mmol/L (ref 20–29)
Calcium: 9.8 mg/dL (ref 8.7–10.3)
Chloride: 104 mmol/L (ref 96–106)
Creatinine, Ser: 0.75 mg/dL (ref 0.57–1.00)
Globulin, Total: 2.5 g/dL (ref 1.5–4.5)
Glucose: 80 mg/dL (ref 70–99)
Potassium: 3.9 mmol/L (ref 3.5–5.2)
Sodium: 142 mmol/L (ref 134–144)
Total Protein: 6.6 g/dL (ref 6.0–8.5)
eGFR: 89 mL/min/{1.73_m2} (ref >59)

## 2022-05-03 LAB — LIPID PANEL
Chol/HDL Ratio: 2.9 ratio (ref 0.0–4.4)
Cholesterol, Total: 154 mg/dL (ref 100–199)
HDL: 54 mg/dL (ref >39)
LDL Chol Calc (NIH): 83 mg/dL (ref 0–99)
Triglycerides: 94 mg/dL (ref 0–149)
VLDL Cholesterol Cal: 17 mg/dL (ref 5–40)

## 2022-05-03 LAB — HEMOGLOBIN A1C
Est. average glucose Bld gHb Est-mCnc: 128 mg/dL
Hgb A1c MFr Bld: 6.1 % — ABNORMAL HIGH (ref 4.8–5.6)

## 2022-05-12 ENCOUNTER — Other Ambulatory Visit: Payer: Self-pay | Admitting: Endocrinology

## 2022-05-25 ENCOUNTER — Other Ambulatory Visit: Payer: POS

## 2022-05-30 ENCOUNTER — Encounter: Payer: Self-pay | Admitting: Endocrinology

## 2022-05-30 ENCOUNTER — Ambulatory Visit (INDEPENDENT_AMBULATORY_CARE_PROVIDER_SITE_OTHER): Payer: 59 | Admitting: Endocrinology

## 2022-05-30 VITALS — BP 130/62 | HR 81 | Ht 64.0 in | Wt 177.4 lb

## 2022-05-30 DIAGNOSIS — E1169 Type 2 diabetes mellitus with other specified complication: Secondary | ICD-10-CM

## 2022-05-30 DIAGNOSIS — E669 Obesity, unspecified: Secondary | ICD-10-CM | POA: Diagnosis not present

## 2022-05-30 NOTE — Progress Notes (Signed)
Patient ID: Heidi Howell, female   DOB: July 30, 1955, 67 y.o.   MRN: [id number]           Reason for Appointment: Type II Diabetes follow-up   History of Present Illness   Diagnosis date: 2007  Previous history:  Non-insulin hypoglycemic drugs previously used: Metformin, Trulicity, Prandin Rybelsus was started in 11/20 Insulin was started in 2014 and more recently was taking Humulin R  A1c range in the last few years is: 5.8-7.5  Recent history:     Non-insulin hypoglycemic drugs: Prandin 2 mg 3 times daily, metformin ER 1000 mg, Jardiance 25 mg, Mounjaro 5 mg weekly         Side effects from medications: None  Current self management, blood sugar patterns and problems identified:  A1c is 6.1, 7.2 previously She has done dramatically better with increasing her Mounjaro to 10 mg No side effects like constipation with this She also has lost weight which was difficult to achieve before Although she does not report symptoms of low sugars during the day or after supper she has a few low sugars in the mornings when she feels lightheaded Monitoring after meals is infrequent Her lab glucose was 80 fasting  Exercise: Currently none, previously walking  Glucometer: One Touch Verio.            PRE-MEAL Fasting Lunch Dinner Bedtime Overall  Glucose range: 57-125    57-125  Mean/median: 90    88   POST-MEAL PC Breakfast PC Lunch PC Dinner  Glucose range:     Mean/median: 77, 94  84, 114   Previously  PRE-MEAL Morning Lunch Dinner Bedtime Overall  Glucose range: 98-211      Mean/median: 140    140   POST-MEAL PC Breakfast PC Lunch PC Dinner  Glucose range:   123-181  Mean/median:   146    Dietician visit: Most recent:  2017    Weight control:  Wt Readings from Last 3 Encounters:  05/30/22 177 lb 6.4 oz (80.5 kg)  05/02/22 173 lb 14.4 oz (78.9 kg)  04/10/22 175 lb 11.3 oz (79.7 kg)            Diabetes labs:  Lab Results  Component Value Date   HGBA1C 6.1 (H)  05/02/2022   HGBA1C 7.2 (H) 12/06/2021   HGBA1C 6.7 (A) 07/31/2021   Lab Results  Component Value Date   MICROALBUR <0.7 12/06/2021   LDLCALC 83 05/02/2022   CREATININE 0.75 05/02/2022    Lab Results  Component Value Date   FRUCTOSAMINE 261 02/20/2022     Allergies as of 05/30/2022       Reactions   Ace Inhibitors Cough        Medication List        Accurate as of May 30, 2022 11:06 AM. If you have any questions, ask your nurse or doctor.          aspirin 81 MG tablet Take 81 mg by mouth daily.   carvedilol 25 MG tablet Commonly known as: COREG Take 1 tablet (25 mg total) by mouth daily.   dorzolamide-timolol 2-0.5 % ophthalmic solution Commonly known as: COSOPT Place 1 drop into both eyes 2 (two) times daily.   empagliflozin 25 MG Tabs tablet Commonly known as: Jardiance Take 1 tablet (25 mg total) by mouth daily.   hydrochlorothiazide 25 MG tablet Commonly known as: HYDRODIURIL Take 1 tablet (25 mg total) by mouth daily.   metFORMIN 500 MG 24 hr tablet Commonly  known as: GLUCOPHAGE-XR Take 2 tablets (1,000 mg total) by mouth daily with breakfast.   montelukast 10 MG tablet Commonly known as: SINGULAIR TAKE 1 TABLET AT BEDTIME (OFFICE VISIT NEEDED FOR ADDITIONAL REFILLS)   Mounjaro 10 MG/0.5ML Pen Generic drug: tirzepatide INJECT 10 MG SUBCUTANEOUSLY ONCE A WEEK   multivitamin tablet Take 1 tablet by mouth daily.   nebivolol 10 MG tablet Commonly known as: BYSTOLIC Take 1 tablet (10 mg total) by mouth daily.   Netarsudil Dimesylate 0.02 % Soln Apply to eye. Instill 1 drop in both eyes every evening   OneTouch Verio test strip Generic drug: glucose blood 1 each by Other route daily. And lancets 1/day   repaglinide 2 MG tablet Commonly known as: PRANDIN TAKE 1 TABLET THREE TIMES A DAY BEFORE MEALS   telmisartan 80 MG tablet Commonly known as: MICARDIS TAKE 1 TABLET DAILY   zolpidem 10 MG tablet Commonly known as: Ambien Take  1 tablet (10 mg total) by mouth at bedtime as needed for sleep.        Allergies:  Allergies  Allergen Reactions   Ace Inhibitors Cough    Past Medical History:  Diagnosis Date   Anemia    Diabetes mellitus without complication ([place])    Fibroids    Glaucoma    H/O: obesity    Hypertension    Varicose vein     Past Surgical History:  Procedure Laterality Date   BARTHOLIN GLAND CYST EXCISION  2010   BREAST REDUCTION SURGERY Bilateral 04/10/2022   Procedure: MAMMARY REDUCTION  (BREAST);  Surgeon: Irene Limbo, MD;  Location: [facility];  Service: Plastics;  Laterality: Bilateral;   CESAREAN SECTION  9233,0076   DILATION AND CURETTAGE OF UTERUS  1989   TUBAL LIGATION  1996   bilateral    Family History  Problem Relation Age of Onset   Cancer Father        colon   Cancer Mother        per patient stomach   Diabetes Maternal Grandmother    Diabetes Maternal Grandfather    Mental illness Sister    Stroke Sister    Heart disease Brother    Diabetes Brother    Heart disease Sister    Stroke Paternal Aunt     Social History:  reports that she has never smoked. She has never been exposed to tobacco smoke. She has never used smokeless tobacco. She reports that she does not drink alcohol and does not use drugs.  Review of Systems:  Last diabetic eye exam date 2021  Last foot exam date: 8/23  Last urine microalbumin: Normal in 8/23  Symptoms of neuropathy: none  Hypertension:   Treatment includes hydrochlorothiazide and telmisartan 80 mg  BP Readings from Last 3 Encounters:  05/30/22 130/62  05/02/22 108/71  04/10/22 (!) 153/73    Lipid history: Has had normal lipids without any medications    Lab Results  Component Value Date   CHOL 154 05/02/2022   CHOL 154 12/06/2021   CHOL 154 03/31/2019   Lab Results  Component Value Date   HDL 54 05/02/2022   HDL 50.20 12/06/2021   HDL 53 03/31/2019   Lab Results  Component Value Date    LDLCALC 83 05/02/2022   LDLCALC 84 12/06/2021   LDLCALC 83 03/31/2019   Lab Results  Component Value Date   TRIG 94 05/02/2022   TRIG 100.0 12/06/2021   TRIG 97 03/31/2019   Lab Results  Component  Value Date   CHOLHDL 2.9 05/02/2022   CHOLHDL 3 12/06/2021   CHOLHDL 2.9 03/31/2019   No results found for: "LDLDIRECT"   Examination:   BP 130/62   Pulse 81   Ht '5\' 4"'$  (1.626 m)   Wt 177 lb 6.4 oz (80.5 kg)   LMP 01/15/2011   SpO2 99%   BMI 30.45 kg/m   Body mass index is 30.45 kg/m.    ASSESSMENT/ PLAN:    Diabetes type 2 non-insulin-dependent with mild obesity: 1/24  Current regimen: Mounjaro 10 mg weekly,Prandin 2 mg 3 times daily, metformin ER 1000 mg, Jardiance 25 mg  See history of present illness for detailed discussion of current diabetes management, blood sugar patterns and problems identified  A1c is 6.1 compared to 7.2  Blood glucose control is significantly better with 10 mg Mounjaro and she is tolerating this well With her blood sugars been low normal or low she is likely having adequate insulin secretion now Also does not appear to be having higher readings after meals when checked Has lost weight  Renal function stable with Jardiance  LIPIDS: Still in the target range without any statins  Recommendations:  Will try to stop her Prandin She will check more readings after meals and let us know of she is getting consistent readings over 160, may possibly need a smaller dose of Prandin Continue 10 mg Mounjaro along with metformin and Jardiance If able to start doing more walking regularly Follow-up in 4 months   There are no Patient Instructions on file for this visit.   Visit time including counseling = 30 minutes  Elayne Snare 05/30/2022, 11:06 AM

## 2022-05-30 NOTE — Patient Instructions (Signed)
Check blood sugars on waking up 2-3 days a week  Also check blood sugars about 2 hours after meals and do this after different meals by rotation  Recommended blood sugar levels on waking up are 90-130 and about 2 hours after meal is 130-160  Please bring your blood sugar monitor to each visit, thank you  Stop Prandin

## 2022-06-14 ENCOUNTER — Other Ambulatory Visit: Payer: Self-pay | Admitting: Endocrinology

## 2022-06-25 ENCOUNTER — Other Ambulatory Visit: Payer: Self-pay | Admitting: Family Medicine

## 2022-06-25 NOTE — Telephone Encounter (Signed)
Medication Refill - Medication: hydrochlorothiazide (HYDRODIURIL) 25 MG tablet   Has the patient contacted their pharmacy? No   Preferred Pharmacy (with phone number or street name):  [place], [place] Phone: [phone]  Fax: [phone]     Has the patient been seen for an appointment in the last year OR does the patient have an upcoming appointment? Yes.    Please assist patient further

## 2022-06-26 NOTE — Telephone Encounter (Signed)
Requested medication (s) are due for refill today: yes  Requested medication (s) are on the active medication list: yes  Last refill:  02/22/21  Future visit scheduled: yes  Notes to clinic:  Unable to refill per protocol, last refill by another provider.      Requested Prescriptions  Pending Prescriptions Disp Refills   hydrochlorothiazide (HYDRODIURIL) 25 MG tablet 90 tablet 1    Sig: Take 1 tablet (25 mg total) by mouth daily.     Cardiovascular: Diuretics - Thiazide Passed - 06/25/2022 11:45 AM      Passed - Cr in normal range and within 180 days    Creat  Date Value Ref Range Status  09/23/2014 0.62 0.50 - 1.10 mg/dL Final   Creatinine, Ser  Date Value Ref Range Status  05/02/2022 0.75 0.57 - 1.00 mg/dL Final   Creatinine,U  Date Value Ref Range Status  12/06/2021 69.8 mg/dL Final         Passed - K in normal range and within 180 days    Potassium  Date Value Ref Range Status  05/02/2022 3.9 3.5 - 5.2 mmol/L Final         Passed - Na in normal range and within 180 days    Sodium  Date Value Ref Range Status  05/02/2022 142 134 - 144 mmol/L Final         Passed - Last BP in normal range    BP Readings from Last 1 Encounters:  05/30/22 130/62         Passed - Valid encounter within last 6 months    Recent Outpatient Visits           1 month ago Annual physical exam   [place] Simmons-Robinson, Riki Sheer, MD   3 months ago Primary insomnia   [place], Riki Sheer, MD   4 months ago Encounter to establish care   [place] [facility] [place] J, NP   2 years ago Type 2 diabetes mellitus with other ophthalmic complication, with long-term current use of insulin [facility])   Primary Care at [place], MD   3 years ago Health maintenance examination   Primary Care at [facility], Arlie Solomons, MD       Future Appointments             In 1 month  Simmons-Robinson, Riki Sheer, MD [facility] [place]

## 2022-06-27 NOTE — Telephone Encounter (Signed)
This has been filled in the past by different provider. Dr. [place], Kyung Rudd

## 2022-06-28 MED ORDER — HYDROCHLOROTHIAZIDE 25 MG PO TABS: 25.0000 mg | ORAL_TABLET | Freq: Every day | ORAL | 1 refills | Status: DC

## 2022-07-11 ENCOUNTER — Other Ambulatory Visit: Payer: Self-pay

## 2022-07-11 DIAGNOSIS — E669 Obesity, unspecified: Secondary | ICD-10-CM

## 2022-07-11 MED ORDER — METFORMIN HCL ER 500 MG PO TB24: 1000.0000 mg | ORAL_TABLET | Freq: Every day | ORAL | 3 refills | Status: DC

## 2022-07-27 ENCOUNTER — Telehealth: Payer: Self-pay

## 2022-07-27 NOTE — Telephone Encounter (Signed)
Patient called to report she is having difficulty obtaining Mounjaro. She has called multiple pharmacies and no one has any. She would like to know what you recommend.  Please advise, thank you!

## 2022-07-30 NOTE — Telephone Encounter (Signed)
Patient says she wants to take [facility].  She will just wait for the pharmacy to get [facility] in.

## 2022-08-03 NOTE — Progress Notes (Unsigned)
I,Joseline E Rosas,acting as a scribe for [employer], MD.,have documented all relevant documentation on the behalf of [name], MD,as directed by  [name], MD while in the presence of [name], MD.   Established patient visit   Patient: Heidi Howell   DOB: 1955/10/28   67 y.o. Female  MRN: [id number] Visit Date: 08/06/2022  Today's healthcare provider: [name], MD   Chief Complaint  Patient presents with   Follow-up   Subjective    HPI  Diabetes Mellitus Type II, Follow-up  Lab Results  Component Value Date   HGBA1C 6.1 (H) 05/02/2022   HGBA1C 7.2 (H) 12/06/2021   HGBA1C 6.7 (A) 07/31/2021   Wt Readings from Last 3 Encounters:  08/06/22 174 lb 3.2 oz (79 kg)  05/30/22 177 lb 6.4 oz (80.5 kg)  05/02/22 173 lb 14.4 oz (78.9 kg)   Last seen for diabetes 3 months ago by PCP, saw her endocrinologist on 05/30/22 Management since then includes Continue metformin 1000mg  daily, mounjaro 10mg  weeky,prandin 2mg  daily TID and jardiance 25mg  daily. She reports excellent compliance with treatment States that she has not been able to take her Darcel Bayley for close to 1 month due to supply issues at the pharmacy  Reports that they were notified that the pharamcy recently received more supply  Patient's husband is requesting a coupon to help with medication cost  BG fasting was 110 this morning, states that she has been eating at home for most meals and is walking with her husband  Pertinent Labs: Lab Results  Component Value Date   CHOL 154 05/02/2022   HDL 54 05/02/2022   LDLCALC 83 05/02/2022   TRIG 94 05/02/2022   CHOLHDL 2.9 05/02/2022   Lab Results  Component Value Date   NA 142 05/02/2022   K 3.9 05/02/2022   CREATININE 0.75 05/02/2022   EGFR 89 05/02/2022   MICRALBCREAT 1.0 12/06/2021     ---------------------------------------------------------------------------------------------------   Follow up for Insomnia  The patient was last seen for this 3 months ago. Changes made at last visit include restart patient on Ambien on prior dose 10mg  nightly .  She reports excellent compliance with treatment. She feels that condition is Improved. She reports feeling well rested sleeping close to 8 hours per night   Hypertension, follow-up  BP Readings from Last 3 Encounters:  08/06/22 118/78  05/30/22 130/62  05/02/22 108/71   Wt Readings from Last 3 Encounters:  08/06/22 174 lb 3.2 oz (79 kg)  05/30/22 177 lb 6.4 oz (80.5 kg)  05/02/22 173 lb 14.4 oz (78.9 kg)     She was last seen for hypertension 6 months ago.  BP at that visit was 108/71. Management since that visit includes continuing micardis 80mg , coreg 25mg  daily,hctz 25mg  QD She reports excellent compliance with treatment. Outside blood pressures are 107-110's/70's.  Medications: Outpatient Medications Prior to Visit  Medication Sig   aspirin 81 MG tablet Take 81 mg by mouth daily.   carvedilol (COREG) 25 MG tablet Take 1 tablet (25 mg total) by mouth daily.   dorzolamide-timolol (COSOPT) 2-0.5 % ophthalmic solution Place 1 drop into both eyes 2 (two) times daily.   glucose blood (ONETOUCH VERIO) test strip 1 each by Other route daily. And lancets 1/day   hydrochlorothiazide (HYDRODIURIL) 25 MG tablet Take 1 tablet (25 mg total) by mouth daily.   metFORMIN (GLUCOPHAGE-XR) 500 MG 24 hr tablet Take 2 tablets (1,000 mg total) by mouth daily with breakfast.  montelukast (SINGULAIR) 10 MG tablet TAKE 1 TABLET AT BEDTIME (OFFICE VISIT NEEDED FOR ADDITIONAL REFILLS)   Multiple Vitamin (MULTIVITAMIN) tablet Take 1 tablet by mouth daily.   nebivolol (BYSTOLIC) 10 MG tablet Take 1 tablet (10 mg total) by mouth daily.   Netarsudil Dimesylate 0.02 % SOLN Apply to eye. Instill 1 drop in both eyes every evening   repaglinide (PRANDIN) 2 MG tablet TAKE 1 TABLET THREE TIMES A DAY BEFORE MEALS   telmisartan (MICARDIS) 80 MG  tablet TAKE 1 TABLET DAILY   tirzepatide (MOUNJARO) 10 MG/0.5ML Pen INJECT 10 MG SUBCUTANEOUSLY ONCE A WEEK   zolpidem (AMBIEN) 10 MG tablet Take 1 tablet (10 mg total) by mouth at bedtime as needed for sleep.   [DISCONTINUED] empagliflozin (JARDIANCE) 25 MG TABS tablet Take 1 tablet (25 mg total) by mouth daily.   No facility-administered medications prior to visit.    Review of Systems     Objective    BP 118/78 (BP Location: Right Arm, Patient Position: Sitting, Cuff Size: Normal)   Pulse 62   Temp 98.2 F (36.8 C) (Oral)   Resp 16   Wt 174 lb 3.2 oz (79 kg)   LMP 01/15/2011   BMI 29.90 kg/m    Physical Exam Vitals reviewed.  Constitutional:      General: She is not in acute distress.    Appearance: Normal appearance. She is not ill-appearing, toxic-appearing or diaphoretic.  Eyes:     Conjunctiva/sclera: Conjunctivae normal.  Neck:     Thyroid: No thyroid mass, thyromegaly or thyroid tenderness.     Vascular: No carotid bruit.  Cardiovascular:     Rate and Rhythm: Normal rate and regular rhythm.     Pulses: Normal pulses.     Heart sounds: Normal heart sounds. No murmur heard.    No friction rub. No gallop.  Pulmonary:     Effort: Pulmonary effort is normal. No respiratory distress.     Breath sounds: Normal breath sounds. No stridor. No wheezing, rhonchi or rales.  Abdominal:     General: Bowel sounds are normal. There is no distension.     Palpations: Abdomen is soft.     Tenderness: There is no abdominal tenderness.  Musculoskeletal:     Right lower leg: No edema.     Left lower leg: No edema.  Lymphadenopathy:     Cervical: No cervical adenopathy.  Skin:    Findings: No erythema or rash.  Neurological:     Mental Status: She is alert and oriented to person, place, and time.       No results found for any visits on 08/06/22.  Assessment & Plan     Problem List Items Addressed This Visit       Cardiovascular and Mediastinum   Essential  hypertension    Controlled BP at goal Continue current medications at current doses No medications changes today          Endocrine   Type 2 diabetes mellitus without complication, without long-term current use of insulin    Chronic problem  Stable  Continue current medications  No medication changes today         Other   Insomnia - Primary    Chronic  Improved on medication  She will continue ambien 10mg  nightly         Return in about 3 months (around 11/05/2022).      The entirety of the information documented in the History of Present Illness, Review of  Systems and Physical Exam were personally obtained by me. Portions of this information were initially documented by Lyndel Pleasure, CMA. I, [name], MD have reviewed the documentation above for thoroughness and accuracy.   [name], MD  [facility] [phone] (phone) [phone] (fax)  [place]

## 2022-08-04 ENCOUNTER — Other Ambulatory Visit: Payer: Self-pay | Admitting: Family Medicine

## 2022-08-06 ENCOUNTER — Encounter: Payer: Self-pay | Admitting: Family Medicine

## 2022-08-06 ENCOUNTER — Ambulatory Visit (INDEPENDENT_AMBULATORY_CARE_PROVIDER_SITE_OTHER): Payer: 59 | Admitting: Family Medicine

## 2022-08-06 VITALS — BP 118/78 | HR 62 | Temp 98.2°F | Resp 16 | Wt 174.2 lb

## 2022-08-06 DIAGNOSIS — I10 Essential (primary) hypertension: Secondary | ICD-10-CM

## 2022-08-06 DIAGNOSIS — F5101 Primary insomnia: Secondary | ICD-10-CM

## 2022-08-06 DIAGNOSIS — E119 Type 2 diabetes mellitus without complications: Secondary | ICD-10-CM

## 2022-08-06 NOTE — Assessment & Plan Note (Signed)
Controlled BP at goal Continue current medications at current doses No medications changes today   

## 2022-08-06 NOTE — Patient Instructions (Signed)
It was a pleasure to see you today!  Thank you for choosing [facility] for your primary care.   Heidi Howell was seen for blood pressure, diabetes and insomnia.   Our plans for today were: Please continue your blood pressure medications and sleep medication as prescribed.  Please search for [facility] savings card which will direct you to a website to receive the medication for as little as $25 for a 1-3 month supply. Please continue your diabetes regimen  To keep you healthy, please keep in mind the following health maintenance items that you are due for:   Shingrix Vaccine Diabetes eye exam   You should return to our clinic in 3 months for blood pressure.   Best Wishes,   Dr. Quentin Cornwall

## 2022-08-06 NOTE — Assessment & Plan Note (Signed)
Chronic  Improved on medication  She will continue ambien 10mg  nightly

## 2022-08-06 NOTE — Assessment & Plan Note (Addendum)
Chronic problem  Stable  Continue current medications  No medication changes today

## 2022-08-09 LAB — HM DIABETES EYE EXAM

## 2022-08-10 ENCOUNTER — Other Ambulatory Visit: Payer: Self-pay | Admitting: Family Medicine

## 2022-08-13 NOTE — Telephone Encounter (Signed)
Requested medication (s) are due for refill today: yes  Requested medication (s) are on the active medication list: yes  Last refill:  multiple dates  Future visit scheduled: yes  Notes to clinic:  Unable to refill per protocol, last refill by another provider. Routing for approval     Requested Prescriptions  Pending Prescriptions Disp Refills   montelukast (SINGULAIR) 10 MG tablet [Pharmacy Med Name: MONTELUKAST SOD TABS 10MG ] 90 tablet 3    Sig: TAKE 1 TABLET DAILY     Pulmonology:  Leukotriene Inhibitors Passed - 08/10/2022  8:37 PM      Passed - Valid encounter within last 12 months    Recent Outpatient Visits           1 week ago Primary insomnia   [place] [facility], [place], MD   3 months ago Annual physical exam   [place] [facility] Simmons-Robinson, [place], MD   4 months ago Primary insomnia   [place] [facility] [place], [place], MD   6 months ago Encounter to establish care   [place] [facility], [place] J, NP   2 years ago Type 2 diabetes mellitus with other ophthalmic complication, with long-term current use of insulin (HCC)   Primary Care at [facility], Manus Rudd, MD       Future Appointments             In 2 months Simmons-Robinson, Tawanna Cooler, MD [facility]             nebivolol (BYSTOLIC) 10 MG tablet [Pharmacy Med Name: NEBIVOLOL TABS 10MG ] 90 tablet 3    Sig: TAKE 1 TABLET DAILY     Cardiovascular: Beta Blockers 3 Passed - 08/10/2022  8:37 PM      Passed - Cr in normal range and within 360 days    Creat  Date Value Ref Range Status  09/23/2014 0.62 0.50 - 1.10 mg/dL Final   Creatinine, Ser  Date Value Ref Range Status  05/02/2022 0.75 0.57 - 1.00 mg/dL Final   Creatinine,U  Date Value Ref Range Status  12/06/2021 69.8 mg/dL Final         Passed - AST in normal range and within 360 days    AST   Date Value Ref Range Status  05/02/2022 16 0 - 40 IU/L Final         Passed - ALT in normal range and within 360 days    ALT  Date Value Ref Range Status  05/02/2022 15 0 - 32 IU/L Final         Passed - Last BP in normal range    BP Readings from Last 1 Encounters:  08/06/22 118/78         Passed - Last Heart Rate in normal range    Pulse Readings from Last 1 Encounters:  08/06/22 62         Passed - Valid encounter within last 6 months    Recent Outpatient Visits           1 week ago Primary insomnia   [place] [facility] [place], [place], MD   3 months ago Annual physical exam   [place] [facility] Simmons-Robinson, [place], MD   4 months ago Primary insomnia   [place] [facility] [place], Tawanna Cooler, MD   6 months ago Encounter to establish care   [facility]  at [facility], [name] J, NP   2 years ago Type 2 diabetes mellitus with other ophthalmic complication, with long-term current use of insulin [facility])   Primary Care at [facility], Manus Rudd, MD       Future Appointments             In 2 months Simmons-Robinson, Tawanna Cooler, MD [facility]             telmisartan (MICARDIS) 40 MG tablet [Pharmacy Med Name: TELMISARTAN TABS 40MG ] 90 tablet 3    Sig: TAKE 1 TABLET DAILY     Cardiovascular:  Angiotensin Receptor Blockers Passed - 08/10/2022  8:37 PM      Passed - Cr in normal range and within 180 days    Creat  Date Value Ref Range Status  09/23/2014 0.62 0.50 - 1.10 mg/dL Final   Creatinine, Ser  Date Value Ref Range Status  05/02/2022 0.75 0.57 - 1.00 mg/dL Final   Creatinine,U  Date Value Ref Range Status  12/06/2021 69.8 mg/dL Final         Passed - K in normal range and within 180 days    Potassium  Date Value Ref Range Status  05/02/2022 3.9 3.5 - 5.2 mmol/L Final         Passed - Patient is not pregnant       Passed - Last BP in normal range    BP Readings from Last 1 Encounters:  08/06/22 118/78         Passed - Valid encounter within last 6 months    Recent Outpatient Visits           1 week ago Primary insomnia   [place] [facility] Simmons[facility]Rob[facility], [place], MD   3 months ago Annual physical exam   [place] [facility] Simmons-Robinson, Javen [place], MD   4 months ago Primary insomnia   [place] [facility] [place], [place], MD   6 months ago Encounter to establish care   [place] [facility] at [facility], [place] J, NP   2 years ago Type 2 diabetes mellitus with other ophthalmic complication, with long-term current use of insulin [facility])   Primary Care at [facility], Manus Rudd, MD       Future Appointments             In 2 months Simmons-Robinson, Tawanna Cooler, MD [facility]

## 2022-08-14 ENCOUNTER — Other Ambulatory Visit: Payer: Self-pay | Admitting: Family Medicine

## 2022-08-21 NOTE — Telephone Encounter (Signed)
Pt called saying her previous provider prescribed this medication for her and she needs a refill since she is seeing Dr. Roxan Hockey now.  She said Dr. Roxan Hockey should have a list of her medications.  CB@  [phone]

## 2022-08-28 ENCOUNTER — Other Ambulatory Visit: Payer: Self-pay | Admitting: Family Medicine

## 2022-08-28 DIAGNOSIS — F5101 Primary insomnia: Secondary | ICD-10-CM

## 2022-08-28 NOTE — Telephone Encounter (Signed)
Requested medications are due for refill today.  Provider to decide  Requested medications are on the active medications list.  yes  Last refill. 05/02/2022 #90 0 rf  Future visit scheduled.   yes  Notes to clinic.  Refill not delegated.    Requested Prescriptions  Pending Prescriptions Disp Refills   zolpidem (AMBIEN) 10 MG tablet [Pharmacy Med Name: ZOLPIDEM TARTRATE TABS ] 90 tablet 0    Sig: TAKE 1 TABLET AT BEDTIME AS NEEDED FOR SLEEP     Not Delegated - Psychiatry:  Anxiolytics/Hypnotics Failed - 08/28/2022  3:35 PM      Failed - This refill cannot be delegated      Failed - Urine Drug Screen completed in last 360 days      Passed - Valid encounter within last 6 months    Recent Outpatient Visits           3 weeks ago Primary insomnia   [place] [facility] Simmons-Robinson, [place], MD   3 months ago Annual physical exam   [place] [facility] Simmons-Robinson, [place], MD   5 months ago Primary insomnia   [place] [facility] [place], [place], MD   6 months ago Encounter to establish care   [facility] at [facility], [place] J, NP   2 years ago Type 2 diabetes mellitus with other ophthalmic complication, with long-term current use of insulin [facility])   Primary Care at [facility], Manus Rudd, MD       Future Appointments             In 2 months Simmons-Robinson, Tawanna Cooler, MD [facility], [facility]

## 2022-08-29 ENCOUNTER — Encounter: Payer: Self-pay | Admitting: Family Medicine

## 2022-08-30 ENCOUNTER — Other Ambulatory Visit: Payer: Self-pay

## 2022-08-30 MED ORDER — TELMISARTAN 80 MG PO TABS: 80.0000 mg | ORAL_TABLET | Freq: Every day | ORAL | 0 refills | Status: DC

## 2022-08-30 MED ORDER — TELMISARTAN 80 MG PO TABS: 80.0000 mg | ORAL_TABLET | Freq: Every day | ORAL | 1 refills | Status: DC

## 2022-08-30 NOTE — Telephone Encounter (Signed)
Patient has been getting this filled by a Dr. Lum Keas  (maybe her previous PCP) I think they have been sending the refill request to him.   Are you ok with me sending in a [facility] and 30 to a local since she is out

## 2022-09-03 ENCOUNTER — Other Ambulatory Visit: Payer: Self-pay

## 2022-09-03 MED ORDER — MOUNJARO 10 MG/0.5ML ~~LOC~~ SOAJ: SUBCUTANEOUS | 1 refills | Status: DC

## 2022-09-10 ENCOUNTER — Telehealth: Payer: Self-pay | Admitting: Family Medicine

## 2022-09-11 ENCOUNTER — Other Ambulatory Visit: Payer: Self-pay

## 2022-09-11 MED ORDER — TELMISARTAN 80 MG PO TABS: 80.0000 mg | ORAL_TABLET | Freq: Every day | ORAL | 1 refills | Status: DC

## 2022-09-11 NOTE — Telephone Encounter (Signed)
Opened in error

## 2022-09-13 ENCOUNTER — Telehealth: Payer: Self-pay

## 2022-09-13 ENCOUNTER — Other Ambulatory Visit: Payer: Self-pay | Admitting: Family Medicine

## 2022-09-13 DIAGNOSIS — Z794 Long term (current) use of insulin: Secondary | ICD-10-CM

## 2022-09-13 NOTE — Telephone Encounter (Signed)
Orders Placed This Encounter  Procedures   Comprehensive metabolic panel    Standing Status:   Future    Standing Expiration Date:   03/16/2023   Microalbumin / creatinine urine ratio    Standing Status:   Future    Standing Expiration Date:   03/16/2023   Lipid panel    Standing Status:   Future    Standing Expiration Date:   03/16/2023   Hemoglobin A1c    Standing Status:   Future    Standing Expiration Date:   03/16/2023

## 2022-09-14 ENCOUNTER — Other Ambulatory Visit (INDEPENDENT_AMBULATORY_CARE_PROVIDER_SITE_OTHER): Payer: 59

## 2022-09-14 DIAGNOSIS — E1165 Type 2 diabetes mellitus with hyperglycemia: Secondary | ICD-10-CM

## 2022-09-14 DIAGNOSIS — Z794 Long term (current) use of insulin: Secondary | ICD-10-CM

## 2022-09-14 NOTE — Addendum Note (Signed)
Addended by: Chelesa Weingartner F on: 09/14/2022 02:48 PM   Modules accepted: Orders  

## 2022-09-14 NOTE — Addendum Note (Signed)
Addended by: Joselynne Killam F on: 09/14/2022 02:49 PM   Modules accepted: Orders  

## 2022-09-14 NOTE — Addendum Note (Signed)
Addended by: Cleda Mccreedy F on: 09/14/2022 02:49 PM   Modules accepted: Orders

## 2022-09-14 NOTE — Addendum Note (Signed)
Addended by: Marjie Chea F on: 09/14/2022 02:49 PM   Modules accepted: Orders  

## 2022-09-14 NOTE — Addendum Note (Signed)
Addended by: Omarius Grantham F on: 09/14/2022 02:49 PM   Modules accepted: Orders  

## 2022-09-14 NOTE — Addendum Note (Signed)
Addended by: Riham Polyakov F on: 09/14/2022 02:49 PM   Modules accepted: Orders  

## 2022-09-15 LAB — COMPREHENSIVE METABOLIC PANEL
AG Ratio: 1.5 (calc) (ref 1.0–2.5)
ALT: 19 U/L (ref 6–29)
AST: 17 U/L (ref 10–35)
Albumin: 4 g/dL (ref 3.6–5.1)
Alkaline phosphatase (APISO): 66 U/L (ref 37–153)
BUN: 18 mg/dL (ref 7–25)
CO2: 28 mmol/L (ref 20–32)
Calcium: 9.6 mg/dL (ref 8.6–10.4)
Chloride: 105 mmol/L (ref 98–110)
Creat: 0.94 mg/dL (ref 0.50–1.05)
Globulin: 2.6 g/dL (calc) (ref 1.9–3.7)
Glucose, Bld: 87 mg/dL (ref 65–99)
Potassium: 3.6 mmol/L (ref 3.5–5.3)
Sodium: 140 mmol/L (ref 135–146)
Total Bilirubin: 0.4 mg/dL (ref 0.2–1.2)
Total Protein: 6.6 g/dL (ref 6.1–8.1)

## 2022-09-15 LAB — LIPID PANEL
Cholesterol: 154 mg/dL (ref <200)
HDL: 48 mg/dL — ABNORMAL LOW (ref >=50)
LDL Cholesterol (Calc): 79 mg/dL (calc)
Non-HDL Cholesterol (Calc): 106 mg/dL (calc) (ref <130)
Total CHOL/HDL Ratio: 3.2 (calc) (ref <5.0)
Triglycerides: 171 mg/dL — ABNORMAL HIGH (ref <150)

## 2022-09-15 LAB — MICROALBUMIN / CREATININE URINE RATIO
Creatinine, Urine: 56 mg/dL (ref 20–275)
Microalb, Ur: <0.2 mg/dL

## 2022-09-15 LAB — HEMOGLOBIN A1C
Hgb A1c MFr Bld: 5.9 % of total Hgb — ABNORMAL HIGH (ref <5.7)
Mean Plasma Glucose: 123 mg/dL
eAG (mmol/L): 6.8 mmol/L

## 2022-09-17 ENCOUNTER — Ambulatory Visit: Payer: 59 | Admitting: "Endocrinology

## 2022-10-02 ENCOUNTER — Encounter: Payer: Self-pay | Admitting: "Endocrinology

## 2022-10-02 ENCOUNTER — Ambulatory Visit (INDEPENDENT_AMBULATORY_CARE_PROVIDER_SITE_OTHER): Payer: 59 | Admitting: "Endocrinology

## 2022-10-02 VITALS — BP 120/64 | HR 70 | Ht 64.0 in | Wt 176.8 lb

## 2022-10-02 DIAGNOSIS — E1140 Type 2 diabetes mellitus with diabetic neuropathy, unspecified: Secondary | ICD-10-CM

## 2022-10-02 DIAGNOSIS — Z7985 Long-term (current) use of injectable non-insulin antidiabetic drugs: Secondary | ICD-10-CM | POA: Diagnosis not present

## 2022-10-02 DIAGNOSIS — E782 Mixed hyperlipidemia: Secondary | ICD-10-CM

## 2022-10-02 DIAGNOSIS — Z7984 Long term (current) use of oral hypoglycemic drugs: Secondary | ICD-10-CM | POA: Diagnosis not present

## 2022-10-02 NOTE — Progress Notes (Signed)
Outpatient Endocrinology Note Altamese [place], MD  10/02/22   Heidi Howell 1955-09-09 [id number]  Referring Provider: Brett Albino* Primary Care Provider: Ronnald Ramp, MD Reason for consultation: Subjective   Assessment & Plan  Diagnoses and all orders for this visit:  Controlled type 2 diabetes with neuropathy (HCC)  Long term (current) use of oral hypoglycemic drugs  Long-term (current) use of injectable non-insulin antidiabetic drugs  Mixed hypercholesterolemia and hypertriglyceridemia    Diabetes complicated by neuropathy, retinopathy Hba1c goal less than 7.0, current Hba1c is 5.9. Will recommend for the following change of medications to: Prandin 2 mg 3 times daily, metformin ER 500 mg two pills a day, Jardiance 25 mg a day, Mounjaro 12.5 mg weekly      Decrease prandin by 1 pill if Bg drops <70 Goal is to streamline therapy on full dose synjardy and mounjaro and add statin  No known contraindications to any of above medications No history of MEN syndrome/medullary thyroid cancer/pancreatitis or pancreatic cancer in self or family  Hyperlipidemia -Last LDL at goal: 25 -not on statin  -Follow low fat diet and exercise   -Blood pressure goal <140/90 - Microalbumin/creatinine at goal < 30 - on ACE/ARB telmisartan 80 mg qd -diet changes including salt restriction -limit eating outside -counseled BP targets per standards of diabetes care -Uncontrolled blood pressure can lead to retinopathy, nephropathy and cardiovascular and atherosclerotic heart disease  Reviewed and counseled on: -A1C target -Blood sugar targets -Complications of uncontrolled diabetes  -Checking blood sugar before meals and bedtime and bring log next visit -All medications with mechanism of action and side effects -Hypoglycemia management: rule of 15's, Glucagon Emergency Kit and medical alert ID -low-carb low-fat plate-method diet -At least 20 minutes of  physical activity per day -Annual dilated retinal eye exam and foot exam -compliance and follow up needs -follow up as scheduled or earlier if problem gets worse  Call if blood sugar is less than 70 or consistently above 250    Take a 15 gm snack of carbohydrate at bedtime before you go to sleep if your blood sugar is less than 100.    If you are going to fast after midnight for a test or procedure, ask your physician for instructions on how to reduce/decrease your insulin dose.    Call if blood sugar is less than 70 or consistently above 250  -Treating a low sugar by rule of 15  (15 gms of sugar every 15 min until sugar is more than 70) If you feel your sugar is low, test your sugar to be sure If your sugar is low (less than 70), then take 15 grams of a fast acting Carbohydrate (3-4 glucose tablets or glucose gel or 4 ounces of juice or regular soda) Recheck your sugar 15 min after treating low to make sure it is more than 70 If sugar is still less than 70, treat again with 15 grams of carbohydrate          Don't drive the hour of hypoglycemia  If unconscious/unable to eat or drink by mouth, use glucagon injection or nasal spray baqsimi and call 911. Can repeat again in 15 min if still unconscious.  Return in about 3 months (around 01/02/2023).   I have reviewed current medications, nurse's notes, allergies, vital signs, past medical and surgical history, family medical history, and social history for this encounter. Counseled patient on symptoms, examination findings, lab findings, imaging results, treatment decisions and monitoring and prognosis. The  patient understood the recommendations and agrees with the treatment plan. All questions regarding treatment plan were fully answered.  Altamese Curran, MD  10/02/22    History of Present Illness Heidi Howell is a 67 y.o. year old female who presents for evaluation of Type 2 diabetes mellitus.  Heidi Howell was first diagnosed in  2007.   Diabetes education +  Current regimen:   Prandin 2 mg 3 times daily, metformin ER 1000 mg, Jardiance 25 mg, Mounjaro 10 mg weekly       Previous history: Non-insulin hypoglycemic drugs previously used: Metformin, Trulicity, Prandin Rybelsus was started in 11/20 Insulin was started in 2014 and more recently was taking Humulin R  COMPLICATIONS -  MI/Stroke -  retinopathy, sees ophthal annually +  neuropathy -  nephropathy  BLOOD SUGAR DATA 85-142 on meter Checks bidAC  Physical Exam  BP 120/64   Pulse 70   Ht 5\' 4"  (1.626 m)   Wt 176 lb 12.8 oz (80.2 kg)   LMP 01/15/2011   BMI 30.35 kg/m    Constitutional: well developed, well nourished Head: normocephalic, atraumatic Eyes: sclera anicteric, no redness Neck: supple Lungs: normal respiratory effort Neurology: alert and oriented Skin: dry, no appreciable rashes Musculoskeletal: no appreciable defects Psychiatric: normal mood and affect Diabetic Foot Exam - Simple   No data filed      Current Medications Patient's Medications  New Prescriptions   No medications on file  Previous Medications   ASPIRIN 81 MG TABLET    Take 81 mg by mouth daily.   CARVEDILOL (COREG) 25 MG TABLET    Take 1 tablet (25 mg total) by mouth daily.   DORZOLAMIDE-TIMOLOL (COSOPT) 2-0.5 % OPHTHALMIC SOLUTION    Place 1 drop into both eyes 2 (two) times daily.   GLUCOSE BLOOD (ONETOUCH VERIO) TEST STRIP    1 each by Other route daily. And lancets 1/day   HYDROCHLOROTHIAZIDE (HYDRODIURIL) 25 MG TABLET    Take 1 tablet (25 mg total) by mouth daily.   JARDIANCE 25 MG TABS TABLET    TAKE 1 TABLET DAILY   METFORMIN (GLUCOPHAGE-XR) 500 MG 24 HR TABLET    Take 2 tablets (1,000 mg total) by mouth daily with breakfast.   MONTELUKAST (SINGULAIR) 10 MG TABLET    TAKE 1 TABLET DAILY   MULTIPLE VITAMIN (MULTIVITAMIN) TABLET    Take 1 tablet by mouth daily.   NEBIVOLOL (BYSTOLIC) 10 MG TABLET    TAKE 1 TABLET DAILY   NETARSUDIL DIMESYLATE 0.02 %  SOLN    Apply to eye. Instill 1 drop in both eyes every evening   REPAGLINIDE (PRANDIN) 2 MG TABLET    TAKE 1 TABLET THREE TIMES A DAY BEFORE MEALS   TELMISARTAN (MICARDIS) 80 MG TABLET    Take 1 tablet (80 mg total) by mouth daily.   TIRZEPATIDE (MOUNJARO) 10 MG/0.5ML PEN    INJECT 10 MG SUBCUTANEOUSLY ONCE A WEEK   ZOLPIDEM (AMBIEN) 10 MG TABLET    TAKE 1 TABLET AT BEDTIME AS NEEDED FOR SLEEP  Modified Medications   No medications on file  Discontinued Medications   No medications on file    Allergies Allergies  Allergen Reactions   Ace Inhibitors Cough    Past Medical History Past Medical History:  Diagnosis Date   Anemia    Diabetes mellitus without complication (HCC)    Fibroids    Glaucoma    H/O: obesity    Hypertension    Varicose vein  Past Surgical History Past Surgical History:  Procedure Laterality Date   BARTHOLIN GLAND CYST EXCISION  2010   BREAST REDUCTION SURGERY Bilateral 04/10/2022   Procedure: MAMMARY REDUCTION  (BREAST);  Surgeon: Glenna Fellows, MD;  Location: [facility];  Service: Plastics;  Laterality: Bilateral;   CESAREAN SECTION  1610,9604   DILATION AND CURETTAGE OF UTERUS  1989   TUBAL LIGATION  1996   bilateral    Family History family history includes Cancer in her father and mother; Diabetes in her brother, maternal grandfather, and maternal grandmother; Heart disease in her brother and sister; Mental illness in her sister; Stroke in her paternal aunt and sister.  Social History Social History   Socioeconomic History   Marital status: Married    Spouse name: Barbaraann Rondo   Number of children: Not on file   Years of education: Not on file   Highest education level: Not on file  Occupational History   Not on file  Tobacco Use   Smoking status: Never    Passive exposure: Never   Smokeless tobacco: Never  Vaping Use   Vaping Use: Never used  Substance and Sexual Activity   Alcohol use: No   Drug use: No    Sexual activity: Yes    Partners: Male    Birth control/protection: Post-menopausal    Comment: 1st intercourse 73 yo-5 partners  Other Topics Concern   Not on file  Social History Narrative   Exercise walk 2-3 times for 30-40 minutes   Social Determinants of Health   Financial Resource Strain: Not on file  Food Insecurity: Not on file  Transportation Needs: Not on file  Physical Activity: Not on file  Stress: Not on file  Social Connections: Not on file  Intimate Partner Violence: Not on file    Lab Results  Component Value Date   HGBA1C 5.9 (H) 09/14/2022   HGBA1C 6.1 (H) 05/02/2022   HGBA1C 7.2 (H) 12/06/2021   Lab Results  Component Value Date   CHOL 154 09/14/2022   Lab Results  Component Value Date   HDL 48 (L) 09/14/2022   Lab Results  Component Value Date   LDLCALC 79 09/14/2022   Lab Results  Component Value Date   TRIG 171 (H) 09/14/2022   Lab Results  Component Value Date   CHOLHDL 3.2 09/14/2022   Lab Results  Component Value Date   CREATININE 0.94 09/14/2022   Lab Results  Component Value Date   GFR 65.76 02/20/2022   Lab Results  Component Value Date   MICROALBUR <0.2 09/14/2022      Component Value Date/Time   NA 140 09/14/2022 1548   NA 142 05/02/2022 1029   K 3.6 09/14/2022 1548   CL 105 09/14/2022 1548   CO2 28 09/14/2022 1548   GLUCOSE 87 09/14/2022 1548   BUN 18 09/14/2022 1548   BUN 9 05/02/2022 1029   CREATININE 0.94 09/14/2022 1548   CALCIUM 9.6 09/14/2022 1548   PROT 6.6 09/14/2022 1548   PROT 6.6 05/02/2022 1029   ALBUMIN 4.1 05/02/2022 1029   AST 17 09/14/2022 1548   ALT 19 09/14/2022 1548   ALKPHOS 84 05/02/2022 1029   BILITOT 0.4 09/14/2022 1548   BILITOT 0.3 05/02/2022 1029   GFRNONAA >60 04/04/2022 1326   GFRNONAA >89 09/23/2014 1226   GFRAA 91 03/31/2019 0901   GFRAA >89 09/23/2014 1226      Latest Ref Rng & Units 09/14/2022    3:48 PM 05/02/2022  10:29 AM 04/04/2022    1:26 PM  BMP  Glucose 65 -  99 mg/dL 87  80  454   BUN 7 - 25 mg/dL 18  9  11    Creatinine 0.50 - 1.05 mg/dL 0.98  1.19  1.47   BUN/Creat Ratio 6 - 22 (calc) SEE NOTE:  12    Sodium 135 - 146 mmol/L 140  142  140   Potassium 3.5 - 5.3 mmol/L 3.6  3.9  4.2   Chloride 98 - 110 mmol/L 105  104  107   CO2 20 - 32 mmol/L 28  24  27    Calcium 8.6 - 10.4 mg/dL 9.6  9.8  9.2        Component Value Date/Time   WBC 6.1 05/02/2022 1029   WBC 6.6 04/04/2022 1326   RBC 4.30 05/02/2022 1029   RBC 4.47 04/04/2022 1326   HGB 11.4 05/02/2022 1029   HCT 35.2 05/02/2022 1029   PLT 301 05/02/2022 1029   MCV 82 05/02/2022 1029   MCH 26.5 (L) 05/02/2022 1029   MCH 27.7 04/04/2022 1326   MCHC 32.4 05/02/2022 1029   MCHC 33.1 04/04/2022 1326   RDW 12.3 05/02/2022 1029   LYMPHSABS 2.6 05/02/2022 1029   MONOABS 0.6 04/04/2022 1326   EOSABS 0.3 05/02/2022 1029   BASOSABS 0.1 05/02/2022 1029     Parts of this note may have been dictated using voice recognition software. There may be variances in spelling and vocabulary which are unintentional. Not all errors are proofread. Please notify the Thereasa Parkin if any discrepancies are noted or if the meaning of any statement is not clear.

## 2022-10-02 NOTE — Patient Instructions (Signed)

## 2022-10-12 ENCOUNTER — Other Ambulatory Visit: Payer: Self-pay | Admitting: Family Medicine

## 2022-10-12 DIAGNOSIS — F5101 Primary insomnia: Secondary | ICD-10-CM

## 2022-10-12 NOTE — Telephone Encounter (Signed)
Requested medication (s) are due for refill today: yes  Requested medication (s) are on the active medication list: yes  Last refill:  09/03/22  Future visit scheduled: yes  Notes to clinic:  Unable to refill per protocol, cannot delegate.      Requested Prescriptions  Pending Prescriptions Disp Refills   zolpidem (AMBIEN) 10 MG tablet [Pharmacy Med Name: ZOLPIDEM TARTRATE TABS 10MG ] 30 tablet 0    Sig: TAKE 1 TABLET AT BEDTIME AS NEEDED FOR SLEEP     Not Delegated - Psychiatry:  Anxiolytics/Hypnotics Failed - 10/12/2022  7:59 AM      Failed - This refill cannot be delegated      Failed - Urine Drug Screen completed in last 360 days      Passed - Valid encounter within last 6 months    Recent Outpatient Visits           2 months ago Primary insomnia   [place] [facility] Simmons-Robinson, [place], MD   5 months ago Annual physical exam   [place] [facility] Simmons-Robinson, [place], MD   6 months ago Primary insomnia   [place] [facility] [place], [place], MD   8 months ago Encounter to establish care   [facility] Care at [facility], [place] J, NP   3 years ago Type 2 diabetes mellitus with other ophthalmic complication, with long-term current use of insulin [facility])   Primary Care at [facility], Manus Rudd, MD       Future Appointments             In 3 weeks Simmons-Robinson, Tawanna Cooler, MD [facility], [facility]

## 2022-11-05 NOTE — Progress Notes (Unsigned)
Established patient visit   Patient: Heidi Howell   DOB: 1955-09-27   67 y.o. Female  MRN: [id number] Visit Date: 11/06/2022  Today's healthcare provider: Ronnald Ramp, MD   No chief complaint on file.  Subjective      Hypertension, follow-up  BP Readings from Last 3 Encounters:  10/02/22 120/64  08/06/22 118/78  05/30/22 130/62   Wt Readings from Last 3 Encounters:  10/02/22 176 lb 12.8 oz (80.2 kg)  08/06/22 174 lb 3.2 oz (79 kg)  05/30/22 177 lb 6.4 oz (80.5 kg)     She was last seen for hypertension {NUMBERS 1-12:18279} {days/wks/mos/yrs:310907} ago.  BP at that visit was ***. Management since that visit includes ***.  She reports {excellent/good/fair/poor:19665} compliance with treatment. She {is/is not:9024} having side effects. {document side effects if present:1} She is following a {diet:21022986} diet. She {is/is not:9024} exercising. She {does/does not:200015} smoke.  Use of agents associated with hypertension: {bp agents assoc with hypertension:511::"none"}.   Outside blood pressures are {***enter patient reported home BP readings, or 'not being checked':1}. Symptoms: {Yes/No:20286} chest pain {Yes/No:20286} chest pressure  {Yes/No:20286} palpitations {Yes/No:20286} syncope  {Yes/No:20286} dyspnea {Yes/No:20286} orthopnea  {Yes/No:20286} paroxysmal nocturnal dyspnea {Yes/No:20286} lower extremity edema   Pertinent labs Lab Results  Component Value Date   CHOL 154 09/14/2022   HDL 48 (L) 09/14/2022   LDLCALC 79 09/14/2022   TRIG 171 (H) 09/14/2022   CHOLHDL 3.2 09/14/2022   Lab Results  Component Value Date   NA 140 09/14/2022   K 3.6 09/14/2022   CREATININE 0.94 09/14/2022   EGFR 89 05/02/2022   GLUCOSE 87 09/14/2022   TSH 1.170 11/20/2016     The 10-year ASCVD risk score (Arnett DK, et al., 2019) is: 14.7%  ---------------------------------------------------------------------------------------------------  Insomnia   ***  Medications: Outpatient Medications Prior to Visit  Medication Sig   aspirin 81 MG tablet Take 81 mg by mouth daily.   carvedilol (COREG) 25 MG tablet Take 1 tablet (25 mg total) by mouth daily.   dorzolamide-timolol (COSOPT) 2-0.5 % ophthalmic solution Place 1 drop into both eyes 2 (two) times daily.   glucose blood (ONETOUCH VERIO) test strip 1 each by Other route daily. And lancets 1/day   hydrochlorothiazide (HYDRODIURIL) 25 MG tablet Take 1 tablet (25 mg total) by mouth daily.   JARDIANCE 25 MG TABS tablet TAKE 1 TABLET DAILY   metFORMIN (GLUCOPHAGE-XR) 500 MG 24 hr tablet Take 2 tablets (1,000 mg total) by mouth daily with breakfast.   montelukast (SINGULAIR) 10 MG tablet TAKE 1 TABLET DAILY   Multiple Vitamin (MULTIVITAMIN) tablet Take 1 tablet by mouth daily.   nebivolol (BYSTOLIC) 10 MG tablet TAKE 1 TABLET DAILY   Netarsudil Dimesylate 0.02 % SOLN Apply to eye. Instill 1 drop in both eyes every evening   repaglinide (PRANDIN) 2 MG tablet TAKE 1 TABLET THREE TIMES A DAY BEFORE MEALS   telmisartan (MICARDIS) 80 MG tablet Take 1 tablet (80 mg total) by mouth daily.   tirzepatide (MOUNJARO) 10 MG/0.5ML Pen INJECT 10 MG SUBCUTANEOUSLY ONCE A WEEK   zolpidem (AMBIEN) 10 MG tablet TAKE 1 TABLET AT BEDTIME AS NEEDED FOR SLEEP   No facility-administered medications prior to visit.    Review of Systems  {Labs  Heme  Chem  Endocrine  Serology  Results Review (optional):23779}   Objective    LMP 01/15/2011  {Show previous vital signs (optional):23777}  Physical Exam  ***  No results found for any visits on 11/06/22.  Assessment & Plan     Problem List Items Addressed This Visit     Insomnia   Essential hypertension - Primary     No follow-ups on file.         The entirety of the information documented in the History of Present Illness, Review of Systems and Physical Exam were personally obtained by me. Portions of this information were initially  documented by *** . I, Ronnald Ramp, MD have reviewed the documentation above for thoroughness and accuracy.     Ronnald Ramp, MD  [facility] [phone] (phone) [phone] (fax)  [facility]

## 2022-11-06 ENCOUNTER — Encounter: Payer: Self-pay | Admitting: Family Medicine

## 2022-11-06 ENCOUNTER — Ambulatory Visit (INDEPENDENT_AMBULATORY_CARE_PROVIDER_SITE_OTHER): Payer: 59 | Admitting: Family Medicine

## 2022-11-06 VITALS — BP 120/70 | HR 74 | Temp 98.1°F | Resp 14 | Ht 64.0 in | Wt 174.5 lb

## 2022-11-06 DIAGNOSIS — Z0000 Encounter for general adult medical examination without abnormal findings: Secondary | ICD-10-CM | POA: Diagnosis not present

## 2022-11-06 DIAGNOSIS — I10 Essential (primary) hypertension: Secondary | ICD-10-CM | POA: Diagnosis not present

## 2022-11-06 DIAGNOSIS — F5101 Primary insomnia: Secondary | ICD-10-CM | POA: Diagnosis not present

## 2022-11-06 MED ORDER — HYDROCHLOROTHIAZIDE 12.5 MG PO CAPS
12.5000 mg | ORAL_CAPSULE | Freq: Every day | ORAL | 0 refills | Status: DC
Start: 2022-11-06 — End: 2022-12-12

## 2022-11-06 MED ORDER — TELMISARTAN 80 MG PO TABS: 80.0000 mg | ORAL_TABLET | Freq: Every day | ORAL | 1 refills | Status: DC

## 2022-11-06 MED ORDER — TELMISARTAN 80 MG PO TABS: 40.0000 mg | ORAL_TABLET | Freq: Every day | ORAL | 1 refills | Status: DC

## 2022-11-06 NOTE — Assessment & Plan Note (Addendum)
Controlled BP at goal in office  Patient reports some hypotension and lightheadedness sometimes with standing  Recommended decreasing hydrochlorothiazide from 25mg  to 12.5mg  daily and continued monitoring BP with goal between 100/60 and 130/80 Continue telmisartan 80mg  and bystolic 10mg  daily F/u in 1 month for BP

## 2022-11-06 NOTE — Assessment & Plan Note (Signed)
Updated COVID vaccine, Shinrix vaccine and prevnar 20 vaccines using [facility]

## 2022-11-06 NOTE — Assessment & Plan Note (Signed)
Chronic  Improved on Ambien 10mg   Continue ambien 10mg  at bedtime  Counseled on importance of sleep hygiene Counseled on age related sleep changes, goal 6-7 hours of sleep per night

## 2022-11-06 NOTE — Patient Instructions (Addendum)
Please start taking the hydrochlorothiazide 12.5 mg daily instead of 25 mg daily. Continue the Telmisartan 80mg  and Bystolic 10mg  daily    Please monitoring your blood pressure with goal less than 130/80, but no lower than 100/60.

## 2022-11-24 ENCOUNTER — Other Ambulatory Visit: Payer: Self-pay | Admitting: Family Medicine

## 2022-11-24 DIAGNOSIS — F5101 Primary insomnia: Secondary | ICD-10-CM

## 2022-11-26 NOTE — Telephone Encounter (Signed)
Requested medication (s) are due for refill today -yes #30  Requested medication (s) are on the active medication list -yes  Future visit scheduled -yes  Last refill: 10/12/22  Notes to clinic: non delegated Rx  Requested Prescriptions  Pending Prescriptions Disp Refills   zolpidem (AMBIEN) 10 MG tablet [Pharmacy Med Name: ZOLPIDEM TARTRATE TABS 10MG ] 30 tablet 0    Sig: TAKE 1 TABLET AT BEDTIME AS NEEDED FOR SLEEP     Not Delegated - Psychiatry:  Anxiolytics/Hypnotics Failed - 11/24/2022  7:57 PM      Failed - This refill cannot be delegated      Failed - Urine Drug Screen completed in last 360 days      Passed - Valid encounter within last 6 months    Recent Outpatient Visits           2 weeks ago Essential hypertension   [place] [facility] Simmons-Robinson, [place], MD   3 months ago Primary insomnia   [place] [facility] Simmons-Robinson, [place], MD   6 months ago Annual physical exam   [place] [facility] Simmons-Robinson, [place], MD   8 months ago Primary insomnia   [place] [facility] [place], [place], MD   9 months ago Encounter to establish care   [place] [facility], [place], NP       Future Appointments             In 2 weeks Simmons-Robinson, Tawanna Cooler, MD [facility]               Requested Prescriptions  Pending Prescriptions Disp Refills   zolpidem (AMBIEN) 10 MG tablet [Pharmacy Med Name: ZOLPIDEM TARTRATE TABS 10MG ] 30 tablet 0    Sig: TAKE 1 TABLET AT BEDTIME AS NEEDED FOR SLEEP     Not Delegated - Psychiatry:  Anxiolytics/Hypnotics Failed - 11/24/2022  7:57 PM      Failed - This refill cannot be delegated      Failed - Urine Drug Screen completed in last 360 days      Passed - Valid encounter within last 6 months    Recent Outpatient Visits           2 weeks ago Essential hypertension    [place] [facility] Simmons-Robinson, [place], MD   3 months ago Primary insomnia   [place] [facility] [place], [place], MD   6 months ago Annual physical exam   [place] [facility] Simmons-Robinson, [place], MD   8 months ago Primary insomnia   [place] [facility] [place], [place], MD   9 months ago Encounter to establish care   [facility] at [facility], [place], NP       Future Appointments             In 2 weeks Simmons-Robinson, Tawanna Cooler, MD [facility], [facility]

## 2022-12-07 NOTE — Progress Notes (Unsigned)
      Established patient visit   Patient: Heidi Howell   DOB: [birth year]   67 y.o. Female  MRN: [id number] Visit Date: 12/12/2022  Today's healthcare provider: Ronnald Ramp, MD   No chief complaint on file.  Subjective      HTN with Symptoms of Hypotension  Seen 1 month ago  Decreased hydrochlorothiazide from 25mg  to 12.5mg  daily and continued monitoring BP with goal between 100/60 and 130/80 Continue telmisartan 80mg  and bystolic 10mg  daily  Medications: Outpatient Medications Prior to Visit  Medication Sig   aspirin 81 MG tablet Take 81 mg by mouth daily.   carvedilol (COREG) 25 MG tablet Take 1 tablet (25 mg total) by mouth daily.   dorzolamide-timolol (COSOPT) 2-0.5 % ophthalmic solution Place 1 drop into both eyes 2 (two) times daily.   glucose blood (ONETOUCH VERIO) test strip 1 each by Other route daily. And lancets 1/day   hydrochlorothiazide (MICROZIDE) 12.5 MG capsule Take 1 capsule (12.5 mg total) by mouth daily.   JARDIANCE 25 MG TABS tablet TAKE 1 TABLET DAILY   metFORMIN (GLUCOPHAGE-XR) 500 MG 24 hr tablet Take 2 tablets (1,000 mg total) by mouth daily with breakfast.   montelukast (SINGULAIR) 10 MG tablet TAKE 1 TABLET DAILY   Multiple Vitamin (MULTIVITAMIN) tablet Take 1 tablet by mouth daily.   nebivolol (BYSTOLIC) 10 MG tablet TAKE 1 TABLET DAILY   Netarsudil Dimesylate 0.02 % SOLN Apply to eye. Instill 1 drop in both eyes every evening   repaglinide (PRANDIN) 2 MG tablet TAKE 1 TABLET THREE TIMES A DAY BEFORE MEALS   telmisartan (MICARDIS) 80 MG tablet Take 1 tablet (80 mg total) by mouth daily.   tirzepatide (MOUNJARO) 10 MG/0.5ML Pen INJECT 10 MG SUBCUTANEOUSLY ONCE A WEEK   zolpidem (AMBIEN) 10 MG tablet TAKE 1 TABLET AT BEDTIME AS NEEDED FOR SLEEP   No facility-administered medications prior to visit.    Review of Systems  {Insert previous labs (optional):23779}  {See past labs  Heme  Chem  Endocrine  Serology  Results Review  (optional):1}   Objective    LMP 01/15/2011  {Insert last BP/Wt (optional):23777}  {See vitals history (optional):1}  Physical Exam  ***  No results found for any visits on 12/12/22.  Assessment & Plan     Problem List Items Addressed This Visit   None    No follow-ups on file.         Ronnald Ramp, MD  [facility] [phone] (phone) [phone] (fax)  [facility]

## 2022-12-11 ENCOUNTER — Ambulatory Visit: Payer: 59 | Admitting: Family Medicine

## 2022-12-12 ENCOUNTER — Encounter: Payer: Self-pay | Admitting: Family Medicine

## 2022-12-12 ENCOUNTER — Ambulatory Visit (INDEPENDENT_AMBULATORY_CARE_PROVIDER_SITE_OTHER): Payer: 59 | Admitting: Family Medicine

## 2022-12-12 VITALS — BP 105/70 | HR 75 | Temp 97.9°F | Resp 12 | Ht 64.0 in | Wt 175.5 lb

## 2022-12-12 DIAGNOSIS — E119 Type 2 diabetes mellitus without complications: Secondary | ICD-10-CM | POA: Diagnosis not present

## 2022-12-12 DIAGNOSIS — M21619 Bunion of unspecified foot: Secondary | ICD-10-CM | POA: Diagnosis not present

## 2022-12-12 DIAGNOSIS — I10 Essential (primary) hypertension: Secondary | ICD-10-CM | POA: Diagnosis not present

## 2022-12-12 MED ORDER — CARVEDILOL 25 MG PO TABS: 25.0000 mg | ORAL_TABLET | Freq: Two times a day (BID) | ORAL | 3 refills | Status: DC

## 2022-12-12 NOTE — Patient Instructions (Addendum)
VISIT SUMMARY:  During your recent visit, we discussed your hypertension, the use of two beta-blockers, your foot pain due to bunions, and your diabetes. You reported feeling better since the dose of your blood pressure medication, hydrochlorothiazide, was reduced. However, you still experienced occasional dizziness. We also discussed your foot pain due to bunions and your interest in non-surgical treatment options.  YOUR PLAN:  -HYPERTENSION: Hypertension is a condition where the force of the blood against the artery walls is too high. We have decided to discontinue your Hydrochlorothiazide medication and continue with Telmisartan 80mg  daily and the coreg 25mg  twice daily.   -BETA-BLOCKER THERAPY: Beta-blockers are medications that reduce your blood pressure. You are currently on two beta-blockers, which is not necessary. We will gradually reduce and eventually stop your Bystolic medication over the next four weeks. You will continue with Carvedilol 25mg  twice daily. For 2 weeks, take the Bystolic every other day  For 2 weeks after taking the bystolic every other day, start taking it every third day  The following week take the bystolic once weekly for two weeks and then completely stop before returning to see me for follow up.   -FOOT PAIN: Your foot pain is due to bunions, which are bony bumps that form on the joint at the base of your big toe. We will refer you to a podiatrist for further evaluation and possible treatment.  -DIABETES: Diabetes is a disease that occurs when your blood glucose, also called blood sugar, is too high. It's important to continue with your regular eye exams as scheduled due to your diabetes.  INSTRUCTIONS:  In six weeks, please check your blood pressure and heart rate. Also, remember to gradually reduce your Bystolic medication as instructed. If you have any concerns or if your symptoms worsen, please contact the office.

## 2022-12-12 NOTE — Assessment & Plan Note (Signed)
Patient reports bunions causing discomfort. -Chronic -Referral to podiatry for evaluation and possible treatment.

## 2022-12-12 NOTE — Assessment & Plan Note (Signed)
Patient has regular eye exams due to diabetes. -chronic Stable, within goal range  Follows with endocrinology  Continue zepbound 10mg  weekly and metformin 1000mg  daily -Continue regular eye exams as scheduled.

## 2022-12-12 NOTE — Assessment & Plan Note (Signed)
Chronic  Improved symptoms of dizziness after reducing Hydrochlorothiazide to 12.5mg . Blood pressure readings are generally within normal limits with a few on the lower side. -Discontinue Hydrochlorothiazide. -Continue Telmisartan 80mg  daily.

## 2022-12-13 ENCOUNTER — Encounter: Payer: Self-pay | Admitting: Family Medicine

## 2022-12-27 ENCOUNTER — Encounter: Payer: Self-pay | Admitting: "Endocrinology

## 2022-12-27 ENCOUNTER — Ambulatory Visit (INDEPENDENT_AMBULATORY_CARE_PROVIDER_SITE_OTHER): Payer: 59 | Admitting: "Endocrinology

## 2022-12-27 VITALS — BP 135/75 | HR 66 | Ht 64.0 in | Wt 177.4 lb

## 2022-12-27 DIAGNOSIS — Z7985 Long-term (current) use of injectable non-insulin antidiabetic drugs: Secondary | ICD-10-CM

## 2022-12-27 DIAGNOSIS — E1140 Type 2 diabetes mellitus with diabetic neuropathy, unspecified: Secondary | ICD-10-CM

## 2022-12-27 DIAGNOSIS — E782 Mixed hyperlipidemia: Secondary | ICD-10-CM | POA: Diagnosis not present

## 2022-12-27 DIAGNOSIS — Z7984 Long term (current) use of oral hypoglycemic drugs: Secondary | ICD-10-CM

## 2022-12-27 LAB — POCT GLYCOSYLATED HEMOGLOBIN (HGB A1C): Hemoglobin A1C: 5.7 % — AB (ref 4.0–5.6)

## 2022-12-27 MED ORDER — ROSUVASTATIN CALCIUM 5 MG PO TABS
5.0000 mg | ORAL_TABLET | Freq: Every day | ORAL | 1 refills | Status: DC
Start: 2022-12-27 — End: 2023-06-28

## 2022-12-27 MED ORDER — MOUNJARO 12.5 MG/0.5ML ~~LOC~~ SOAJ: 12.5000 mg | SUBCUTANEOUS | 1 refills | Status: DC

## 2022-12-27 NOTE — Patient Instructions (Signed)

## 2022-12-27 NOTE — Progress Notes (Signed)
Outpatient Endocrinology Note Heidi [place], MD  12/27/22   Heidi Howell [birth year] [id number]  Referring Provider: Brett Albino* Primary Care Provider: Ronnald Ramp, MD Reason for consultation: Subjective   Assessment & Plan  Diagnoses and all orders for this visit:  Controlled type 2 diabetes with neuropathy (HCC) -     POCT glycosylated hemoglobin (Hb A1C)  Long term (current) use of oral hypoglycemic drugs  Long-term (current) use of injectable non-insulin antidiabetic drugs  Mixed hypercholesterolemia and hypertriglyceridemia  Other orders -     tirzepatide (MOUNJARO) 12.5 MG/0.5ML Pen; Inject 12.5 mg into the skin once a week. -     rosuvastatin (CRESTOR) 5 MG tablet; Take 1 tablet (5 mg total) by mouth daily.    Diabetes complicated by neuropathy, retinopathy Hba1c goal less than 7.0, current Hba1c is 5.7. Will recommend for the following change of medications to: Prandin 2 mg bd before BF and dinner times daily, metformin ER 500 mg two pills a day, Jardiance 25 mg a day, Mounjaro 12.5 mg weekly      Goal is to streamline therapy on full dose synjardy and mounjaro  No known contraindications to any of above medications No history of MEN syndrome/medullary thyroid cancer/pancreatitis or pancreatic cancer in self or family  Hyperlipidemia -Last LDL at goal: 63 -started crestor 5 mg qd  -Follow low fat diet and exercise   -Blood pressure goal <140/90 - Microalbumin/creatinine at goal < 30 - on ACE/ARB telmisartan 80 mg qd -diet changes including salt restriction -limit eating outside -counseled BP targets per standards of diabetes care -Uncontrolled blood pressure can lead to retinopathy, nephropathy and cardiovascular and atherosclerotic heart disease  Reviewed and counseled on: -A1C target -Blood sugar targets -Complications of uncontrolled diabetes  -Checking blood sugar before meals and bedtime and bring log next  visit -All medications with mechanism of action and side effects -Hypoglycemia management: rule of 15's, Glucagon Emergency Kit and medical alert ID -low-carb low-fat plate-method diet -At least 20 minutes of physical activity per day -Annual dilated retinal eye exam and foot exam -compliance and follow up needs -follow up as scheduled or earlier if problem gets worse  Call if blood sugar is less than 70 or consistently above 250    Take a 15 gm snack of carbohydrate at bedtime before you go to sleep if your blood sugar is less than 100.    If you are going to fast after midnight for a test or procedure, ask your physician for instructions on how to reduce/decrease your insulin dose.    Call if blood sugar is less than 70 or consistently above 250  -Treating a low sugar by rule of 15  (15 gms of sugar every 15 min until sugar is more than 70) If you feel your sugar is low, test your sugar to be sure If your sugar is low (less than 70), then take 15 grams of a fast acting Carbohydrate (3-4 glucose tablets or glucose gel or 4 ounces of juice or regular soda) Recheck your sugar 15 min after treating low to make sure it is more than 70 If sugar is still less than 70, treat again with 15 grams of carbohydrate          Don't drive the hour of hypoglycemia  If unconscious/unable to eat or drink by mouth, use glucagon injection or nasal spray baqsimi and call 911. Can repeat again in 15 min if still unconscious.  Return in about 4 months (  around 04/28/2023).   I have reviewed current medications, nurse's notes, allergies, vital signs, past medical and surgical history, family medical history, and social history for this encounter. Counseled patient on symptoms, examination findings, lab findings, imaging results, treatment decisions and monitoring and prognosis. The patient understood the recommendations and agrees with the treatment plan. All questions regarding treatment plan were fully  answered.  Heidi [place], MD  12/27/22    History of Present Illness Heidi Howell is a 67 y.o. year old female who presents for evaluation of Type 2 diabetes mellitus.  Heidi Howell was first diagnosed in 2007.   Diabetes education +  Current regimen:   Prandin 2 mg 3 times daily, metformin ER 1000 mg, Jardiance 25 mg, Mounjaro 10 mg weekly       Previous history: Non-insulin hypoglycemic drugs previously used: Metformin, Trulicity, Prandin Rybelsus was started in 11/20 Insulin was started in 2014 and more recently was taking Humulin R  COMPLICATIONS -  MI/Stroke -  retinopathy, sees ophthal annually +  neuropathy -  nephropathy  BLOOD SUGAR DATA 74-138 on meter Checks qam  Physical Exam  BP 135/75   Pulse 66   Ht 5\' 4"  (1.626 m)   Wt 177 lb 6.4 oz (80.5 kg)   LMP 01/15/2011   SpO2 99%   BMI 30.45 kg/m    Constitutional: well developed, well nourished Head: normocephalic, atraumatic Eyes: sclera anicteric, no redness Neck: supple Lungs: normal respiratory effort Neurology: alert and oriented Skin: dry, no appreciable rashes Musculoskeletal: no appreciable defects Psychiatric: normal mood and affect Diabetic Foot Exam - Simple   No data filed      Current Medications Patient's Medications  New Prescriptions   ROSUVASTATIN (CRESTOR) 5 MG TABLET    Take 1 tablet (5 mg total) by mouth daily.   TIRZEPATIDE (MOUNJARO) 12.5 MG/0.5ML PEN    Inject 12.5 mg into the skin once a week.  Previous Medications   ASPIRIN 81 MG TABLET    Take 81 mg by mouth daily.   CARVEDILOL (COREG) 25 MG TABLET    Take 1 tablet (25 mg total) by mouth 2 (two) times daily with a meal.   DORZOLAMIDE-TIMOLOL (COSOPT) 2-0.5 % OPHTHALMIC SOLUTION    Place 1 drop into both eyes 2 (two) times daily.   GLUCOSE BLOOD (ONETOUCH VERIO) TEST STRIP    1 each by Other route daily. And lancets 1/day   JARDIANCE 25 MG TABS TABLET    TAKE 1 TABLET DAILY   METFORMIN (GLUCOPHAGE-XR) 500 MG 24 HR  TABLET    Take 2 tablets (1,000 mg total) by mouth daily with breakfast.   MONTELUKAST (SINGULAIR) 10 MG TABLET    TAKE 1 TABLET DAILY   MULTIPLE VITAMIN (MULTIVITAMIN) TABLET    Take 1 tablet by mouth daily.   NETARSUDIL DIMESYLATE 0.02 % SOLN    Apply to eye. Instill 1 drop in both eyes every evening   REPAGLINIDE (PRANDIN) 2 MG TABLET    TAKE 1 TABLET THREE TIMES A DAY BEFORE MEALS   TELMISARTAN (MICARDIS) 80 MG TABLET    Take 1 tablet (80 mg total) by mouth daily.   ZOLPIDEM (AMBIEN) 10 MG TABLET    TAKE 1 TABLET AT BEDTIME AS NEEDED FOR SLEEP  Modified Medications   No medications on file  Discontinued Medications   TIRZEPATIDE (MOUNJARO) 10 MG/0.5ML PEN    INJECT 10 MG SUBCUTANEOUSLY ONCE A WEEK    Allergies Allergies  Allergen Reactions   Ace Inhibitors  Cough    Past Medical History Past Medical History:  Diagnosis Date   Anemia    Diabetes mellitus without complication (HCC)    Fibroids    Glaucoma    H/O: obesity    Hypertension    Varicose vein     Past Surgical History Past Surgical History:  Procedure Laterality Date   BARTHOLIN GLAND CYST EXCISION  2010   BREAST REDUCTION SURGERY Bilateral 04/10/2022   Procedure: MAMMARY REDUCTION  (BREAST);  Surgeon: Glenna Fellows, MD;  Location: [facility];  Service: Plastics;  Laterality: Bilateral;   CESAREAN SECTION  3244,0102   DILATION AND CURETTAGE OF UTERUS  1989   TUBAL LIGATION  1996   bilateral    Family History family history includes Cancer in her father and mother; Diabetes in her brother, maternal grandfather, and maternal grandmother; Heart disease in her brother and sister; Mental illness in her sister; Stroke in her paternal aunt and sister.  Social History Social History   Socioeconomic History   Marital status: Married    Spouse name: Barbaraann Rondo   Number of children: Not on file   Years of education: Not on file   Highest education level: Associate degree: occupational,  Scientist, product/process development, or vocational program  Occupational History   Not on file  Tobacco Use   Smoking status: Never    Passive exposure: Never   Smokeless tobacco: Never  Vaping Use   Vaping status: Never Used  Substance and Sexual Activity   Alcohol use: No   Drug use: No   Sexual activity: Yes    Partners: Male    Birth control/protection: Post-menopausal    Comment: 1st intercourse 73 yo-5 partners  Other Topics Concern   Not on file  Social History Narrative   Exercise walk 2-3 times for 30-40 minutes   Social Determinants of Health   Financial Resource Strain: Patient Declined (11/05/2022)   Overall Financial Resource Strain (CARDIA)    Difficulty of Paying Living Expenses: Patient declined  Food Insecurity: Patient Declined (11/05/2022)   Hunger Vital Sign    Worried About Running Out of Food in the Last Year: Patient declined    Ran Out of Food in the Last Year: Patient declined  Transportation Needs: No Transportation Needs (11/05/2022)   PRAPARE - Administrator, Civil Service (Medical): No    Lack of Transportation (Non-Medical): No  Physical Activity: Unknown (11/05/2022)   Exercise Vital Sign    Days of Exercise per Week: 2 days    Minutes of Exercise per Session: Patient declined  Stress: Stress Concern Present (11/05/2022)   [employer] of Occupational Health - Occupational Stress Questionnaire    Feeling of Stress : To some extent  Social Connections: Unknown (11/05/2022)   Social Connection and Isolation Panel [NHANES]    Frequency of Communication with Friends and Family: More than three times a week    Frequency of Social Gatherings with Friends and Family: More than three times a week    Attends Religious Services: Patient declined    Active Member of Clubs or Organizations: No    Attends Banker Meetings: Not on file    Marital Status: Married  Intimate Partner Violence: Not on file    Lab Results  Component Value Date   HGBA1C 5.7 (A)  12/27/2022   HGBA1C 5.9 (H) 09/14/2022   HGBA1C 6.1 (H) 05/02/2022   Lab Results  Component Value Date   CHOL 154 09/14/2022   Lab  Results  Component Value Date   HDL 48 (L) 09/14/2022   Lab Results  Component Value Date   LDLCALC 79 09/14/2022   Lab Results  Component Value Date   TRIG 171 (H) 09/14/2022   Lab Results  Component Value Date   CHOLHDL 3.2 09/14/2022   Lab Results  Component Value Date   CREATININE 0.94 09/14/2022   Lab Results  Component Value Date   GFR 65.76 02/20/2022   Lab Results  Component Value Date   MICROALBUR <0.2 09/14/2022      Component Value Date/Time   NA 140 09/14/2022 1548   NA 142 05/02/2022 1029   K 3.6 09/14/2022 1548   CL 105 09/14/2022 1548   CO2 28 09/14/2022 1548   GLUCOSE 87 09/14/2022 1548   BUN 18 09/14/2022 1548   BUN 9 05/02/2022 1029   CREATININE 0.94 09/14/2022 1548   CALCIUM 9.6 09/14/2022 1548   PROT 6.6 09/14/2022 1548   PROT 6.6 05/02/2022 1029   ALBUMIN 4.1 05/02/2022 1029   AST 17 09/14/2022 1548   ALT 19 09/14/2022 1548   ALKPHOS 84 05/02/2022 1029   BILITOT 0.4 09/14/2022 1548   BILITOT 0.3 05/02/2022 1029   GFRNONAA >60 04/04/2022 1326   GFRNONAA >89 09/23/2014 1226   GFRAA 91 03/31/2019 0901   GFRAA >89 09/23/2014 1226      Latest Ref Rng & Units 09/14/2022    3:48 PM 05/02/2022   10:29 AM 04/04/2022    1:26 PM  BMP  Glucose 65 - 99 mg/dL 87  80  161   BUN 7 - 25 mg/dL 18  9  11    Creatinine 0.50 - 1.05 mg/dL 0.96  0.45  4.09   BUN/Creat Ratio 6 - 22 (calc) SEE NOTE:  12    Sodium 135 - 146 mmol/L 140  142  140   Potassium 3.5 - 5.3 mmol/L 3.6  3.9  4.2   Chloride 98 - 110 mmol/L 105  104  107   CO2 20 - 32 mmol/L 28  24  27    Calcium 8.6 - 10.4 mg/dL 9.6  9.8  9.2        Component Value Date/Time   WBC 6.1 05/02/2022 1029   WBC 6.6 04/04/2022 1326   RBC 4.30 05/02/2022 1029   RBC 4.47 04/04/2022 1326   HGB 11.4 05/02/2022 1029   HCT 35.2 05/02/2022 1029   PLT 301 05/02/2022  1029   MCV 82 05/02/2022 1029   MCH 26.5 (L) 05/02/2022 1029   MCH 27.7 04/04/2022 1326   MCHC 32.4 05/02/2022 1029   MCHC 33.1 04/04/2022 1326   RDW 12.3 05/02/2022 1029   LYMPHSABS 2.6 05/02/2022 1029   MONOABS 0.6 04/04/2022 1326   EOSABS 0.3 05/02/2022 1029   BASOSABS 0.1 05/02/2022 1029     Parts of this note may have been dictated using voice recognition software. There may be variances in spelling and vocabulary which are unintentional. Not all errors are proofread. Please notify the Thereasa Parkin if any discrepancies are noted or if the meaning of any statement is not clear.

## 2023-01-01 ENCOUNTER — Ambulatory Visit: Payer: 59 | Admitting: "Endocrinology

## 2023-01-09 ENCOUNTER — Telehealth: Payer: Self-pay

## 2023-01-09 ENCOUNTER — Ambulatory Visit (INDEPENDENT_AMBULATORY_CARE_PROVIDER_SITE_OTHER): Payer: 59 | Admitting: Podiatry

## 2023-01-09 ENCOUNTER — Encounter: Payer: Self-pay | Admitting: Podiatry

## 2023-01-09 ENCOUNTER — Ambulatory Visit (INDEPENDENT_AMBULATORY_CARE_PROVIDER_SITE_OTHER): Payer: 59

## 2023-01-09 VITALS — BP 178/87

## 2023-01-09 DIAGNOSIS — M21612 Bunion of left foot: Secondary | ICD-10-CM

## 2023-01-09 DIAGNOSIS — E119 Type 2 diabetes mellitus without complications: Secondary | ICD-10-CM

## 2023-01-09 DIAGNOSIS — M21611 Bunion of right foot: Secondary | ICD-10-CM

## 2023-01-09 NOTE — Telephone Encounter (Signed)
Copied from CRM [id number]. Topic: General - Other >> Jan 09, 2023  1:54 PM Clide Dales wrote: Patient is requesting a callback from provider to discuss recent medication changes. Please advise.

## 2023-01-09 NOTE — Progress Notes (Signed)
  Subjective:  Patient ID: Heidi Howell, female    DOB: 1955-08-07,   MRN: [id number]  Chief Complaint  Patient presents with   Diabetes    A1C:per patient is 5.7   Bunions    67 y.o. female presents for concern of diabetes and bilateral bunions that have been causing a lot of pain. Denies burning and tingling in their feet. Patient is diabetic and last A1c was  Lab Results  Component Value Date   HGBA1C 5.7 (A) 12/27/2022   .   PCP:  Ronnald Ramp, MD    . Denies any other pedal complaints. Denies n/v/f/c.   Past Medical History:  Diagnosis Date   Anemia    Diabetes mellitus without complication (HCC)    Fibroids    Glaucoma    H/O: obesity    Hypertension    Varicose vein     Objective:  Physical Exam: Vascular: DP/PT pulses 2/4 bilateral. CFT <3 seconds. Normal hair growth on digits. No edema.  Skin. No lacerations or abrasions bilateral feet. Nails 1-5 normal in appearance.  Musculoskeletal: MMT 5/5 bilateral lower extremities in DF, PF, Inversion and Eversion. Deceased ROM in DF of ankle joint. Bilateral HAV deformities noted with moderate deformity noted on right with more severity than left. Some tenderness to bilateral medial eminence and area of hyperkeratosis. No pain with ROM of the joint. No hypermobility noted of the first ray.  Neurological: Sensation intact to light touch.   Assessment:   1. Bilateral bunions   2. Type 2 diabetes mellitus without complication, without long-term current use of insulin (HCC)      Plan:  Patient was evaluated and treated and all questions answered. -Xrays reviewed Moderate HAV deformity bilateral. Right with IM 1-2 angle of 14 degrees and Left with IM 1-2 angle of about 12 degrees. Mild narrowing of the first MPJ bilateral.  -Discussed HAV and treatment options;conservative and surgical management; risks, benefits, alternatives discussed. All patient's questions answered. -Discussed padding and wide shoe gear.    -Recommend continue with good supportive shoes and inserts.  -Discussed surgical options.  -Discussed and educated patient on diabetic foot care, especially with  regards to the vascular, neurological and musculoskeletal systems.  -Stressed the importance of good glycemic control and the detriment of not  controlling glucose levels in relation to the foot. -Discussed supportive shoes at all times and checking feet regularly.  -Answered all patient questions -Patient to return  in 1 year for diabetic foot check.  -Patient advised to call the office if any problems or questions arise in the meantime.   Louann Sjogren, DPM

## 2023-01-10 MED ORDER — HYDROCHLOROTHIAZIDE 25 MG PO TABS: 12.5000 mg | ORAL_TABLET | Freq: Every day | ORAL | Status: DC

## 2023-01-10 NOTE — Telephone Encounter (Signed)
Patient returned called and reports that she was discontinued from her blood pressure medicine (Hydrochlorothiazide) and was told to continue Telmisartan which she is doing to come off from another beta blocker that starts with "N". Patient is questioning if she should go back on her other bp medicine because home BP readings since yesterday are 178/70's,160/61 and 140/150's(upper).

## 2023-01-10 NOTE — Telephone Encounter (Signed)
Please have patient restart hydrochlorothiazide 12.5mg  along with coreg 25mg  twice daily and telmisartan 80mg  daily

## 2023-01-10 NOTE — Telephone Encounter (Signed)
LMTCB-to see what questions/ or medication she is needing to discuss with the provider.

## 2023-01-14 ENCOUNTER — Other Ambulatory Visit: Payer: Self-pay | Admitting: Family Medicine

## 2023-01-14 ENCOUNTER — Telehealth: Payer: Self-pay

## 2023-01-14 DIAGNOSIS — F5101 Primary insomnia: Secondary | ICD-10-CM

## 2023-01-14 NOTE — Telephone Encounter (Signed)
Pt called regarding medications. Shared provider's note.  Ronnald Ramp, MD      01/10/23  5:32 PM Note Please have patient restart hydrochlorothiazide 12.5mg  along with coreg 25mg  twice daily and telmisartan 80mg  daily     No questions.

## 2023-01-14 NOTE — Telephone Encounter (Signed)
Medication Refill - Medication: zolpidem (AMBIEN) 10 MG tablet [[id number]]    Has the patient contacted their pharmacy? Yes.   (Agent: If no, request that the patient contact the pharmacy for the refill. If patient does not wish to contact the pharmacy document the reason why and proceed with request.) (Agent: If yes, when and what did the pharmacy advise?)  Preferred Pharmacy (with phone number or street name):  [facility] - [place], [place] - [address] Phone: [phone]  Fax: [phone]     Has the patient been seen for an appointment in the last year OR does the patient have an upcoming appointment? Yes.    Agent: Please be advised that RX refills may take up to 3 business days. We ask that you follow-up with your pharmacy.

## 2023-01-16 MED ORDER — ZOLPIDEM TARTRATE 10 MG PO TABS
10.0000 mg | ORAL_TABLET | Freq: Every evening | ORAL | 0 refills | Status: DC | PRN
Start: 2023-01-16 — End: 2023-02-13

## 2023-01-16 NOTE — Telephone Encounter (Signed)
Requested medication (s) are due for refill today:yes  Requested medication (s) are on the active medication list: yes  Last refill:  11/27/22 #30  Future visit scheduled: yes  Notes to clinic:  med not delegated to NT to RF   Requested Prescriptions  Pending Prescriptions Disp Refills   zolpidem (AMBIEN) 10 MG tablet 30 tablet 0    Sig: Take 1 tablet (10 mg total) by mouth at bedtime as needed. for sleep     Not Delegated - Psychiatry:  Anxiolytics/Hypnotics Failed - 01/14/2023  5:18 PM      Failed - This refill cannot be delegated      Failed - Urine Drug Screen completed in last 360 days      Passed - Valid encounter within last 6 months    Recent Outpatient Visits           1 month ago Essential hypertension   [place] [facility] Simmons-Robinson, [place], MD   2 months ago Essential hypertension   [place] [facility] Simmons-Robinson, [place], MD   5 months ago Primary insomnia   [place] [facility], [place], MD   8 months ago Annual physical exam   [place] [facility] Simmons-Robinson, [place], MD   10 months ago Primary insomnia   [place] [facility] Simmons-Robinson, Tawanna Cooler, MD       Future Appointments             In 3 weeks Simmons-Robinson, Tawanna Cooler, MD [facility], [place]

## 2023-01-28 ENCOUNTER — Other Ambulatory Visit: Payer: Self-pay | Admitting: Family Medicine

## 2023-01-28 DIAGNOSIS — I10 Essential (primary) hypertension: Secondary | ICD-10-CM

## 2023-02-11 ENCOUNTER — Other Ambulatory Visit: Payer: Self-pay

## 2023-02-11 ENCOUNTER — Ambulatory Visit (INDEPENDENT_AMBULATORY_CARE_PROVIDER_SITE_OTHER): Payer: 59 | Admitting: Family Medicine

## 2023-02-11 ENCOUNTER — Encounter: Payer: Self-pay | Admitting: Family Medicine

## 2023-02-11 VITALS — BP 126/73 | HR 63 | Temp 98.5°F | Ht 64.0 in | Wt 173.0 lb

## 2023-02-11 DIAGNOSIS — Z7984 Long term (current) use of oral hypoglycemic drugs: Secondary | ICD-10-CM

## 2023-02-11 DIAGNOSIS — Z1231 Encounter for screening mammogram for malignant neoplasm of breast: Secondary | ICD-10-CM | POA: Diagnosis not present

## 2023-02-11 DIAGNOSIS — E1165 Type 2 diabetes mellitus with hyperglycemia: Secondary | ICD-10-CM

## 2023-02-11 DIAGNOSIS — E119 Type 2 diabetes mellitus without complications: Secondary | ICD-10-CM

## 2023-02-11 DIAGNOSIS — Z0000 Encounter for general adult medical examination without abnormal findings: Secondary | ICD-10-CM

## 2023-02-11 DIAGNOSIS — I10 Essential (primary) hypertension: Secondary | ICD-10-CM

## 2023-02-11 DIAGNOSIS — F5101 Primary insomnia: Secondary | ICD-10-CM

## 2023-02-11 DIAGNOSIS — Z7982 Long term (current) use of aspirin: Secondary | ICD-10-CM | POA: Diagnosis not present

## 2023-02-11 MED ORDER — HYDROCHLOROTHIAZIDE 25 MG PO TABS: 25.0000 mg | ORAL_TABLET | Freq: Every day | ORAL | 3 refills | Status: DC

## 2023-02-11 MED ORDER — ONETOUCH VERIO VI STRP: 1.0000 | ORAL_STRIP | Freq: Every day | 12 refills | Status: DC

## 2023-02-11 MED ORDER — NEBIVOLOL HCL 5 MG PO TABS
ORAL_TABLET | ORAL | 0 refills | Status: DC
Start: 2023-02-11 — End: 2023-12-17

## 2023-02-11 NOTE — Assessment & Plan Note (Signed)
-  Discontinue daily Aspirin due to lack of history of stroke or heart attack.

## 2023-02-11 NOTE — Assessment & Plan Note (Signed)
-  Schedule follow-up appointment in December for physical. -Obtain COVID and flu vaccines at pharmacy later today

## 2023-02-11 NOTE — Patient Instructions (Signed)
VISIT SUMMARY:  Dear Heidi Howell, during your recent visit, we discussed your hypertension, diabetes, glaucoma, and insomnia. We made some adjustments to your medications and discussed your overall health maintenance.  YOUR PLAN:  -HYPERTENSION: Your high blood pressure is well controlled with your current medications. We will refill your Hydrochlorothiazide and gradually stop your Bystolic medication over the next few weeks.  -TYPE 2 DIABETES MELLITUS: Your diabetes is well controlled with your current medications. Continue taking Jardiance and Prandin as prescribed, and follow your endocrinologist's instructions for insulin.  -HYPERLIPIDEMIA: Your cholesterol levels are well managed with Crestor. Continue taking this medication daily.  -INSOMNIA: Your sleep issues are well managed with Ambien. Continue taking this medication as needed.  -GENERAL HEALTH MAINTENANCE: We decided to stop your daily Aspirin due to the lack of history of stroke or heart attack. We also discussed the importance of getting your COVID and flu vaccines.  INSTRUCTIONS:  Please schedule a follow-up appointment in December for a partial physical and another one in April. Remember to get your COVID and flu vaccines. Continue taking your medications as prescribed, and gradually stop taking Bystolic over the next few weeks.

## 2023-02-11 NOTE — Assessment & Plan Note (Signed)
Well controlled with recent A1C of 5.7. Patient recently had an increase in insulin dosage which initially caused dizziness but has since resolved. Chronic, at goal of A1c less than 7 -Continue current regimen of Jardiance 25mg  daily and Prandin 2mg  twice daily. -Continue insulin as directed by endocrinologist.

## 2023-02-11 NOTE — Assessment & Plan Note (Signed)
Well controlled on Carvedilol 25mg  BID, Telmisartan 80mg  daily, and Hydrochlorothiazide 25mg  daily. Patient had run out of Hydrochlorothiazide and was restarted on Bystolic due to increasing blood pressures. Chronic, BP at goal of less than 130/80  -Refill Hydrochlorothiazide 25mg  daily. -Taper off Bystolic over the next few weeks (5mg  daily for 2 weeks, then 5mg  every other day, then stop). -continue telmisartan 80mg  daily and coreg 25mg  BID

## 2023-02-11 NOTE — Progress Notes (Signed)
Established patient visit   Patient: Heidi Howell   DOB: January 21, 1956   67 y.o. Female  MRN: [id number] Visit Date: 02/11/2023  Today's healthcare provider: Ronnald Ramp, MD   Chief Complaint  Patient presents with   Hypertension    Patient was last seen 2 months ago.  At that time she was instructed to discontinue her hydrochlorothiazide. After the visit she made a call to the office and reported her blood pressure going up and was advised to go back on the hydrochlorothiazide.  Patient has been getting readings of 100's over 50's while taking the hydrochlorothiazide, Telmisartan and Carvedilol.     Subjective     HPI     Hypertension    Additional comments: Patient was last seen 2 months ago.  At that time she was instructed to discontinue her hydrochlorothiazide. After the visit she made a call to the office and reported her blood pressure going up and was advised to go back on the hydrochlorothiazide.  Patient has been getting readings of 100's over 50's while taking the hydrochlorothiazide, Telmisartan and Carvedilol.        Last edited by Adline Peals, CMA on 02/11/2023  8:15 AM.       Discussed the use of AI scribe software for clinical note transcription with the patient, who gave verbal consent to proceed.  History of Present Illness   Heidi Howell, a 67 year old patient with a history of hypertension, diabetes, and glaucoma, presents for a follow-up visit. The patient was previously on Bystolic and Carvedilol for hypertension management, but Bystolic was tapered off while Carvedilol was continued at 25mg  twice daily. Pt reports she continued bystolic and continued 25mg  hctz.   The patient reports a recent episode of dizziness, initially attributed to changes in blood pressure medication. However, it was later determined to be related to an increase in insulin dosage. The patient's body has since adjusted to the new insulin dosage, and the dizziness  has resolved. The patient's diabetes is well-controlled, with a recent HbA1c of 5.7.  The patient also reports running out of Hydrochlorothiazide, a diuretic medication. The patient has been taking the whole 25mg  tablet, instead of the prescribed half tablet of 12.5mg  daily. The patient's blood pressure has been well-controlled, with only one reported peak.  The patient has been taking Aspirin daily for heart health, despite no history of stroke or heart attack. After discussion, it was decided to discontinue the Aspirin due to increased risk of bleeding without significant cardiovascular benefit.  The patient also reports a recent increase in insulin dosage, which initially caused dizziness. However, the symptoms resolved as the patient's body adjusted to the new dosage.  The patient's glaucoma has been stable, even without medication for three months due to affordability issues. The patient's environment has improved, with no exposure to smoke, which may have contributed to the stability of the glaucoma.  The patient is also on Crestor for cholesterol management, Telmisartan for blood pressure control, and Prandin for diabetes management. The patient's cholesterol levels are not known at this time. The patient is also on Ambien for sleep, which has been effective.       Tapered off of bystolic, continued coreg 25mg  BID, telmisartan 80mg  daily hydrochlorothiazide 12.5mg  daily   Past Medical History:  Diagnosis Date   Anemia    Diabetes mellitus without complication (HCC)    Family history of malignant neoplasm of digestive organs 02/09/2022   Fibroids    Glaucoma  H/O: obesity    Hypertension    Varicose vein     Medications: Outpatient Medications Prior to Visit  Medication Sig   carvedilol (COREG) 25 MG tablet Take 1 tablet (25 mg total) by mouth 2 (two) times daily with a meal.   dorzolamide-timolol (COSOPT) 2-0.5 % ophthalmic solution Place 1 drop into both eyes 2 (two) times  daily.   glucose blood (ONETOUCH VERIO) test strip 1 each by Other route daily. And lancets 1/day   hydrochlorothiazide (HYDRODIURIL) 25 MG tablet Take 0.5 tablets (12.5 mg total) by mouth daily.   JARDIANCE 25 MG TABS tablet TAKE 1 TABLET DAILY   metFORMIN (GLUCOPHAGE-XR) 500 MG 24 hr tablet Take 2 tablets (1,000 mg total) by mouth daily with breakfast.   montelukast (SINGULAIR) 10 MG tablet TAKE 1 TABLET DAILY   Multiple Vitamin (MULTIVITAMIN) tablet Take 1 tablet by mouth daily.   Netarsudil Dimesylate 0.02 % SOLN Apply to eye. Instill 1 drop in both eyes every evening   repaglinide (PRANDIN) 2 MG tablet TAKE 1 TABLET THREE TIMES A DAY BEFORE MEALS   rosuvastatin (CRESTOR) 5 MG tablet Take 1 tablet (5 mg total) by mouth daily.   telmisartan (MICARDIS) 80 MG tablet Take 1 tablet (80 mg total) by mouth daily.   tirzepatide (MOUNJARO) 12.5 MG/0.5ML Pen Inject 12.5 mg into the skin once a week.   zolpidem (AMBIEN) 10 MG tablet Take 1 tablet (10 mg total) by mouth at bedtime as needed. for sleep   [DISCONTINUED] aspirin 81 MG tablet Take 81 mg by mouth daily.   No facility-administered medications prior to visit.    Review of Systems  Last metabolic panel Lab Results  Component Value Date   GLUCOSE 87 09/14/2022   NA 140 09/14/2022   K 3.6 09/14/2022   CL 105 09/14/2022   CO2 28 09/14/2022   BUN 18 09/14/2022   CREATININE 0.94 09/14/2022   EGFR 89 05/02/2022   CALCIUM 9.6 09/14/2022   PROT 6.6 09/14/2022   ALBUMIN 4.1 05/02/2022   LABGLOB 2.5 05/02/2022   AGRATIO 1.6 05/02/2022   BILITOT 0.4 09/14/2022   ALKPHOS 84 05/02/2022   AST 17 09/14/2022   ALT 19 09/14/2022   ANIONGAP 6 04/04/2022   Last lipids Lab Results  Component Value Date   CHOL 154 09/14/2022   HDL 48 (L) 09/14/2022   LDLCALC 79 09/14/2022   TRIG 171 (H) 09/14/2022   CHOLHDL 3.2 09/14/2022        Objective    BP 126/73 (BP Location: Left Arm, Patient Position: Sitting, Cuff Size: Normal)   Pulse 63    Temp 98.5 F (36.9 C) (Oral)   Ht 5\' 4"  (1.626 m)   Wt 173 lb (78.5 kg)   LMP 01/15/2011   SpO2 98%   BMI 29.70 kg/m  BP Readings from Last 3 Encounters:  02/11/23 126/73  01/09/23 (!) 178/87  12/27/22 135/75   Wt Readings from Last 3 Encounters:  02/11/23 173 lb (78.5 kg)  12/27/22 177 lb 6.4 oz (80.5 kg)  12/12/22 175 lb 8 oz (79.6 kg)       Physical Exam Vitals reviewed.  Constitutional:      General: She is not in acute distress.    Appearance: Normal appearance. She is not ill-appearing, toxic-appearing or diaphoretic.  Eyes:     Conjunctiva/sclera: Conjunctivae normal.  Cardiovascular:     Rate and Rhythm: Normal rate and regular rhythm.     Pulses: Normal pulses.  Heart sounds: Normal heart sounds. No murmur heard.    No friction rub. No gallop.  Pulmonary:     Effort: Pulmonary effort is normal. No respiratory distress.     Breath sounds: Normal breath sounds. No stridor. No wheezing, rhonchi or rales.  Abdominal:     General: Bowel sounds are normal. There is no distension.     Palpations: Abdomen is soft.     Tenderness: There is no abdominal tenderness.  Musculoskeletal:     Right lower leg: No edema.     Left lower leg: No edema.  Skin:    Findings: No erythema or rash.  Neurological:     Mental Status: She is alert and oriented to person, place, and time.       No results found for any visits on 02/11/23.  Assessment & Plan     Problem List Items Addressed This Visit     Essential hypertension    Well controlled on Carvedilol 25mg  BID, Telmisartan 80mg  daily, and Hydrochlorothiazide 25mg  daily. Patient had run out of Hydrochlorothiazide and was restarted on Bystolic due to increasing blood pressures. Chronic, BP at goal of less than 130/80  -Refill Hydrochlorothiazide 25mg  daily. -Taper off Bystolic over the next few weeks (5mg  daily for 2 weeks, then 5mg  every other day, then stop). -continue telmisartan 80mg  daily and coreg 25mg  BID        Relevant Medications   hydrochlorothiazide (HYDRODIURIL) 25 MG tablet   nebivolol (BYSTOLIC) 5 MG tablet   Healthcare maintenance    -Schedule follow-up appointment in December for physical. -Obtain COVID and flu vaccines at pharmacy later today       Insomnia    Controlled on Ambien. -Continue Ambien 10mg  as needed for sleep.      Long-term use of aspirin therapy    -Discontinue daily Aspirin due to lack of history of stroke or heart attack.      Type 2 diabetes mellitus without complication, without long-term current use of insulin (HCC)    Well controlled with recent A1C of 5.7. Patient recently had an increase in insulin dosage which initially caused dizziness but has since resolved. Chronic, at goal of A1c less than 7 -Continue current regimen of Jardiance 25mg  daily and Prandin 2mg  twice daily. -Continue insulin as directed by endocrinologist.      Other Visit Diagnoses     Encounter for screening mammogram for malignant neoplasm of breast    -  Primary   Relevant Orders   MM 3D SCREENING MAMMOGRAM BILATERAL BREAST              Return in about 3 months (around 05/04/2023) for CPE.        Ronnald Ramp, MD  [facility] [phone] (phone) [phone] (fax)  [facility]

## 2023-02-11 NOTE — Assessment & Plan Note (Signed)
Controlled on Ambien. -Continue Ambien 10mg  as needed for sleep.

## 2023-02-12 ENCOUNTER — Other Ambulatory Visit: Payer: Self-pay | Admitting: Family Medicine

## 2023-02-12 DIAGNOSIS — F5101 Primary insomnia: Secondary | ICD-10-CM

## 2023-02-13 NOTE — Telephone Encounter (Signed)
Requested medication (s) are due for refill today: Yes  Requested medication (s) are on the active medication list: Yes  Last refill:  01/16/23  Future visit scheduled: Yes  Notes to clinic:  Unable to refill per protocol, cannot delegate.      Requested Prescriptions  Pending Prescriptions Disp Refills   zolpidem (AMBIEN) 10 MG tablet [Pharmacy Med Name: ZOLPIDEM TARTRATE TABS 10MG ] 30 tablet 0    Sig: TAKE 1 TABLET AT BEDTIME AS NEEDED FOR SLEEP     Not Delegated - Psychiatry:  Anxiolytics/Hypnotics Failed - 02/12/2023  4:38 PM      Failed - This refill cannot be delegated      Failed - Urine Drug Screen completed in last 360 days      Passed - Valid encounter within last 6 months    Recent Outpatient Visits           2 days ago Encounter for screening mammogram for malignant neoplasm of breast   [place] [facility] Simmons-Robinson, Tawanna Cooler, MD   2 months ago Essential hypertension   [place] [facility] Simmons-Robinson, [place], MD   3 months ago Essential hypertension   [place] [facility] Simmons-Robinson, [place], MD   6 months ago Primary insomnia   [place] [facility] Simmons-Robinson, [place], MD   9 months ago Annual physical exam    [facility] Simmons-Robinson, Tawanna Cooler, MD       Future Appointments             In 2 months Simmons-Robinson, Tawanna Cooler, MD [facility]

## 2023-03-04 LAB — HM COLONOSCOPY

## 2023-03-30 ENCOUNTER — Other Ambulatory Visit: Payer: Self-pay | Admitting: Family Medicine

## 2023-03-30 DIAGNOSIS — F5101 Primary insomnia: Secondary | ICD-10-CM

## 2023-04-17 ENCOUNTER — Ambulatory Visit: Payer: 59 | Admitting: Podiatry

## 2023-04-29 ENCOUNTER — Encounter: Payer: Self-pay | Admitting: "Endocrinology

## 2023-04-29 ENCOUNTER — Ambulatory Visit (INDEPENDENT_AMBULATORY_CARE_PROVIDER_SITE_OTHER): Payer: 59 | Admitting: "Endocrinology

## 2023-04-29 VITALS — BP 130/70 | HR 64 | Resp 20 | Ht 64.0 in | Wt 173.8 lb

## 2023-04-29 DIAGNOSIS — Z7985 Long-term (current) use of injectable non-insulin antidiabetic drugs: Secondary | ICD-10-CM

## 2023-04-29 DIAGNOSIS — Z7984 Long term (current) use of oral hypoglycemic drugs: Secondary | ICD-10-CM | POA: Diagnosis not present

## 2023-04-29 DIAGNOSIS — E782 Mixed hyperlipidemia: Secondary | ICD-10-CM

## 2023-04-29 DIAGNOSIS — E1140 Type 2 diabetes mellitus with diabetic neuropathy, unspecified: Secondary | ICD-10-CM | POA: Diagnosis not present

## 2023-04-29 LAB — POCT GLYCOSYLATED HEMOGLOBIN (HGB A1C): Hemoglobin A1C: 6 % — AB (ref 4.0–5.6)

## 2023-04-29 MED ORDER — TIRZEPATIDE 15 MG/0.5ML ~~LOC~~ SOAJ: 15.0000 mg | SUBCUTANEOUS | 3 refills | Status: DC

## 2023-04-29 NOTE — Progress Notes (Signed)
Outpatient Endocrinology Note Heidi [place], MD  04/29/23   Heidi Howell [birth year] [id number]  Referring Provider: Brett Albino* Primary Care Provider: Ronnald Ramp, MD Reason for consultation: Subjective   Assessment & Plan  Diagnoses and all orders for this visit:  Controlled type 2 diabetes with neuropathy (HCC) -     POCT glycosylated hemoglobin (Hb A1C)  Long term (current) use of oral hypoglycemic drugs  Long-term (current) use of injectable non-insulin antidiabetic drugs  Mixed hypercholesterolemia and hypertriglyceridemia  Other orders -     tirzepatide (MOUNJARO) 15 MG/0.5ML Pen; Inject 15 mg into the skin once a week.    Diabetes complicated by neuropathy, retinopathy Hba1c goal less than 7.0, current Hba1c is 6%. Will recommend for the following change of medications to: Metformin ER 500 mg two pills a day, Jardiance 25 mg a day, Mounjaro 15 mg weekly     Stop Prandin 2 mg bd before BF and dinner, take only if BG is >200   No known contraindications to any of above medications No history of MEN syndrome/medullary thyroid cancer/pancreatitis or pancreatic cancer in self or family  Hyperlipidemia -Last LDL at goal: 78 -continue crestor 5 mg qd  -Follow low fat diet and exercise   -Blood pressure goal <140/90 - Microalbumin/creatinine at goal < 30 - on ACE/ARB telmisartan 80 mg qd -diet changes including salt restriction -limit eating outside -counseled BP targets per standards of diabetes care -Uncontrolled blood pressure can lead to retinopathy, nephropathy and cardiovascular and atherosclerotic heart disease  Reviewed and counseled on: -A1C target -Blood sugar targets -Complications of uncontrolled diabetes  -Checking blood sugar before meals and bedtime and bring log next visit -All medications with mechanism of action and side effects -Hypoglycemia management: rule of 15's, Glucagon Emergency Kit and medical  alert ID -low-carb low-fat plate-method diet -At least 20 minutes of physical activity per day -Annual dilated retinal eye exam and foot exam -compliance and follow up needs -follow up as scheduled or earlier if problem gets worse  Call if blood sugar is less than 70 or consistently above 250    Take a 15 gm snack of carbohydrate at bedtime before you go to sleep if your blood sugar is less than 100.    If you are going to fast after midnight for a test or procedure, ask your physician for instructions on how to reduce/decrease your insulin dose.    Call if blood sugar is less than 70 or consistently above 250  -Treating a low sugar by rule of 15  (15 gms of sugar every 15 min until sugar is more than 70) If you feel your sugar is low, test your sugar to be sure If your sugar is low (less than 70), then take 15 grams of a fast acting Carbohydrate (3-4 glucose tablets or glucose gel or 4 ounces of juice or regular soda) Recheck your sugar 15 min after treating low to make sure it is more than 70 If sugar is still less than 70, treat again with 15 grams of carbohydrate          Don't drive the hour of hypoglycemia  If unconscious/unable to eat or drink by mouth, use glucagon injection or nasal spray baqsimi and call 911. Can repeat again in 15 min if still unconscious.  Return in about 4 months (around 08/28/2023).   I have reviewed current medications, nurse's notes, allergies, vital signs, past medical and surgical history, family medical history, and social history  for this encounter. Counseled patient on symptoms, examination findings, lab findings, imaging results, treatment decisions and monitoring and prognosis. The patient understood the recommendations and agrees with the treatment plan. All questions regarding treatment plan were fully answered.  Heidi [place], MD  04/29/23    History of Present Illness Heidi Howell is a 67 y.o. year old female who presents for evaluation  of Type 2 diabetes mellitus.  Heidi Howell was first diagnosed in 2007.   Diabetes education +  Current regimen:   Prandin 2 mg bd before BF and dinner, metformin ER 500 mg two pills a day, Jardiance 25 mg a day, Mounjaro 12.5 mg weekly    Previous history: Non-insulin hypoglycemic drugs previously used: Metformin, Trulicity, Prandin Rybelsus was started in 11/20 Insulin was started in 2014 and more recently was taking Humulin R  COMPLICATIONS -  MI/Stroke -  retinopathy, sees ophthal annually +  neuropathy -  nephropathy  BLOOD SUGAR DATA 92-141 on meter Checks qam/qhs  Physical Exam  BP 130/70 (BP Location: Left Arm, Patient Position: Sitting, Cuff Size: Normal)   Pulse 64   Resp 20   Ht 5\' 4"  (1.626 m)   Wt 173 lb 12.8 oz (78.8 kg)   LMP 01/15/2011   SpO2 98%   BMI 29.83 kg/m    Constitutional: well developed, well nourished Head: normocephalic, atraumatic Eyes: sclera anicteric, no redness Neck: supple Lungs: normal respiratory effort Neurology: alert and oriented Skin: dry, no appreciable rashes Musculoskeletal: no appreciable defects Psychiatric: normal mood and affect Diabetic Foot Exam - Simple   No data filed      Current Medications Patient's Medications  New Prescriptions   TIRZEPATIDE (MOUNJARO) 15 MG/0.5ML PEN    Inject 15 mg into the skin once a week.  Previous Medications   CARVEDILOL (COREG) 25 MG TABLET    Take 1 tablet (25 mg total) by mouth 2 (two) times daily with a meal.   DORZOLAMIDE-TIMOLOL (COSOPT) 2-0.5 % OPHTHALMIC SOLUTION    Place 1 drop into both eyes 2 (two) times daily.   GLUCOSE BLOOD (ONETOUCH VERIO) TEST STRIP    1 each by Other route daily. And lancets 1/day   HYDROCHLOROTHIAZIDE (HYDRODIURIL) 25 MG TABLET    Take 0.5 tablets (12.5 mg total) by mouth daily.   HYDROCHLOROTHIAZIDE (HYDRODIURIL) 25 MG TABLET    Take 1 tablet (25 mg total) by mouth daily.   JARDIANCE 25 MG TABS TABLET    TAKE 1 TABLET DAILY   METFORMIN  (GLUCOPHAGE-XR) 500 MG 24 HR TABLET    Take 2 tablets (1,000 mg total) by mouth daily with breakfast.   MONTELUKAST (SINGULAIR) 10 MG TABLET    TAKE 1 TABLET DAILY   MULTIPLE VITAMIN (MULTIVITAMIN) TABLET    Take 1 tablet by mouth daily.   NEBIVOLOL (BYSTOLIC) 5 MG TABLET    Take 1 tablet (5 mg total) by mouth daily for 14 days, THEN 1 tablet (5 mg total) every other day for 7 days.   NETARSUDIL DIMESYLATE 0.02 % SOLN    Apply to eye. Instill 1 drop in both eyes every evening   REPAGLINIDE (PRANDIN) 2 MG TABLET    TAKE 1 TABLET THREE TIMES A DAY BEFORE MEALS   ROSUVASTATIN (CRESTOR) 5 MG TABLET    Take 1 tablet (5 mg total) by mouth daily.   TELMISARTAN (MICARDIS) 80 MG TABLET    Take 1 tablet (80 mg total) by mouth daily.   ZOLPIDEM (AMBIEN) 10 MG TABLET  TAKE 1 TABLET AT BEDTIME AS NEEDED FOR SLEEP  Modified Medications   No medications on file  Discontinued Medications   TIRZEPATIDE (MOUNJARO) 12.5 MG/0.5ML PEN    Inject 12.5 mg into the skin once a week.    Allergies Allergies  Allergen Reactions   Ace Inhibitors Cough    Past Medical History Past Medical History:  Diagnosis Date   Anemia    Diabetes mellitus without complication (HCC)    Family history of malignant neoplasm of digestive organs 02/09/2022   Fibroids    Glaucoma    H/O: obesity    Hypertension    Varicose vein     Past Surgical History Past Surgical History:  Procedure Laterality Date   BARTHOLIN GLAND CYST EXCISION  2010   BREAST REDUCTION SURGERY Bilateral 04/10/2022   Procedure: MAMMARY REDUCTION  (BREAST);  Surgeon: Glenna Fellows, MD;  Location: [facility];  Service: Plastics;  Laterality: Bilateral;   CESAREAN SECTION  1610,9604   DILATION AND CURETTAGE OF UTERUS  1989   TUBAL LIGATION  1996   bilateral    Family History family history includes Cancer in her father and mother; Diabetes in her brother, maternal grandfather, and maternal grandmother; Heart disease in her  brother and sister; Mental illness in her sister; Stroke in her paternal aunt and sister.  Social History Social History   Socioeconomic History   Marital status: Married    Spouse name: Barbaraann Rondo   Number of children: Not on file   Years of education: Not on file   Highest education level: Associate degree: occupational, Scientist, product/process development, or vocational program  Occupational History   Not on file  Tobacco Use   Smoking status: Never    Passive exposure: Never   Smokeless tobacco: Never  Vaping Use   Vaping status: Never Used  Substance and Sexual Activity   Alcohol use: No   Drug use: No   Sexual activity: Yes    Partners: Male    Birth control/protection: Post-menopausal    Comment: 1st intercourse 75 yo-5 partners  Other Topics Concern   Not on file  Social History Narrative   Exercise walk 2-3 times for 30-40 minutes   Social Drivers of Health   Financial Resource Strain: Patient Declined (11/05/2022)   Overall Financial Resource Strain (CARDIA)    Difficulty of Paying Living Expenses: Patient declined  Food Insecurity: Patient Declined (11/05/2022)   Hunger Vital Sign    Worried About Running Out of Food in the Last Year: Patient declined    Ran Out of Food in the Last Year: Patient declined  Transportation Needs: No Transportation Needs (11/05/2022)   PRAPARE - Administrator, Civil Service (Medical): No    Lack of Transportation (Non-Medical): No  Physical Activity: Unknown (11/05/2022)   Exercise Vital Sign    Days of Exercise per Week: 2 days    Minutes of Exercise per Session: Patient declined  Stress: Stress Concern Present (11/05/2022)   [employer] of Occupational Health - Occupational Stress Questionnaire    Feeling of Stress : To some extent  Social Connections: Unknown (11/05/2022)   Social Connection and Isolation Panel [NHANES]    Frequency of Communication with Friends and Family: More than three times a week    Frequency of Social Gatherings  with Friends and Family: More than three times a week    Attends Religious Services: Patient declined    Active Member of Clubs or Organizations: No    Attends  Club or Organization Meetings: Not on file    Marital Status: Married  Intimate Partner Violence: Not on file    Lab Results  Component Value Date   HGBA1C 6.0 (A) 04/29/2023   HGBA1C 5.7 (A) 12/27/2022   HGBA1C 5.9 (H) 09/14/2022   Lab Results  Component Value Date   CHOL 154 09/14/2022   Lab Results  Component Value Date   HDL 48 (L) 09/14/2022   Lab Results  Component Value Date   LDLCALC 79 09/14/2022   Lab Results  Component Value Date   TRIG 171 (H) 09/14/2022   Lab Results  Component Value Date   CHOLHDL 3.2 09/14/2022   Lab Results  Component Value Date   CREATININE 0.94 09/14/2022   Lab Results  Component Value Date   GFR 65.76 02/20/2022   Lab Results  Component Value Date   MICROALBUR <0.2 09/14/2022      Component Value Date/Time   NA 140 09/14/2022 1548   NA 142 05/02/2022 1029   K 3.6 09/14/2022 1548   CL 105 09/14/2022 1548   CO2 28 09/14/2022 1548   GLUCOSE 87 09/14/2022 1548   BUN 18 09/14/2022 1548   BUN 9 05/02/2022 1029   CREATININE 0.94 09/14/2022 1548   CALCIUM 9.6 09/14/2022 1548   PROT 6.6 09/14/2022 1548   PROT 6.6 05/02/2022 1029   ALBUMIN 4.1 05/02/2022 1029   AST 17 09/14/2022 1548   ALT 19 09/14/2022 1548   ALKPHOS 84 05/02/2022 1029   BILITOT 0.4 09/14/2022 1548   BILITOT 0.3 05/02/2022 1029   GFRNONAA >60 04/04/2022 1326   GFRNONAA >89 09/23/2014 1226   GFRAA 91 03/31/2019 0901   GFRAA >89 09/23/2014 1226      Latest Ref Rng & Units 09/14/2022    3:48 PM 05/02/2022   10:29 AM 04/04/2022    1:26 PM  BMP  Glucose 65 - 99 mg/dL 87  80  604   BUN 7 - 25 mg/dL 18  9  11    Creatinine 0.50 - 1.05 mg/dL 5.40  9.81  1.91   BUN/Creat Ratio 6 - 22 (calc) SEE NOTE:  12    Sodium 135 - 146 mmol/L 140  142  140   Potassium 3.5 - 5.3 mmol/L 3.6  3.9  4.2    Chloride 98 - 110 mmol/L 105  104  107   CO2 20 - 32 mmol/L 28  24  27    Calcium 8.6 - 10.4 mg/dL 9.6  9.8  9.2        Component Value Date/Time   WBC 6.1 05/02/2022 1029   WBC 6.6 04/04/2022 1326   RBC 4.30 05/02/2022 1029   RBC 4.47 04/04/2022 1326   HGB 11.4 05/02/2022 1029   HCT 35.2 05/02/2022 1029   PLT 301 05/02/2022 1029   MCV 82 05/02/2022 1029   MCH 26.5 (L) 05/02/2022 1029   MCH 27.7 04/04/2022 1326   MCHC 32.4 05/02/2022 1029   MCHC 33.1 04/04/2022 1326   RDW 12.3 05/02/2022 1029   LYMPHSABS 2.6 05/02/2022 1029   MONOABS 0.6 04/04/2022 1326   EOSABS 0.3 05/02/2022 1029   BASOSABS 0.1 05/02/2022 1029     Parts of this note may have been dictated using voice recognition software. There may be variances in spelling and vocabulary which are unintentional. Not all errors are proofread. Please notify the Thereasa Parkin if any discrepancies are noted or if the meaning of any statement is not clear.

## 2023-05-06 ENCOUNTER — Encounter: Payer: 59 | Admitting: Family Medicine

## 2023-05-06 ENCOUNTER — Other Ambulatory Visit: Payer: Self-pay | Admitting: Family Medicine

## 2023-05-06 DIAGNOSIS — F5101 Primary insomnia: Secondary | ICD-10-CM

## 2023-05-09 ENCOUNTER — Other Ambulatory Visit: Payer: Self-pay

## 2023-05-09 ENCOUNTER — Other Ambulatory Visit: Payer: Self-pay | Admitting: Family Medicine

## 2023-05-09 DIAGNOSIS — Z794 Long term (current) use of insulin: Secondary | ICD-10-CM

## 2023-05-09 MED ORDER — REPAGLINIDE 2 MG PO TABS: ORAL_TABLET | ORAL | 0 refills | Status: DC

## 2023-05-28 ENCOUNTER — Ambulatory Visit (INDEPENDENT_AMBULATORY_CARE_PROVIDER_SITE_OTHER): Payer: Commercial Managed Care - PPO | Admitting: Family Medicine

## 2023-05-28 ENCOUNTER — Encounter: Payer: Self-pay | Admitting: Family Medicine

## 2023-05-28 VITALS — BP 129/67 | HR 67 | Ht 64.0 in | Wt 170.0 lb

## 2023-05-28 DIAGNOSIS — I10 Essential (primary) hypertension: Secondary | ICD-10-CM

## 2023-05-28 DIAGNOSIS — E1169 Type 2 diabetes mellitus with other specified complication: Secondary | ICD-10-CM

## 2023-05-28 DIAGNOSIS — E119 Type 2 diabetes mellitus without complications: Secondary | ICD-10-CM

## 2023-05-28 DIAGNOSIS — Z7984 Long term (current) use of oral hypoglycemic drugs: Secondary | ICD-10-CM

## 2023-05-28 DIAGNOSIS — Z0001 Encounter for general adult medical examination with abnormal findings: Secondary | ICD-10-CM

## 2023-05-28 DIAGNOSIS — Z0000 Encounter for general adult medical examination without abnormal findings: Secondary | ICD-10-CM

## 2023-05-28 NOTE — Assessment & Plan Note (Signed)
Well-controlled blood pressure. - Monitor blood pressure regularly -continue current meds

## 2023-05-28 NOTE — Progress Notes (Signed)
Complete physical exam   Patient: Heidi Howell   DOB: Feb 16, 1956   68 y.o. Female  MRN: [id number] Visit Date: 05/28/2023  Today's healthcare provider: Ronnald Ramp, MD   Chief Complaint  Patient presents with   Annual Exam   Subjective    Heidi Howell is a 68 y.o. female who presents today for a complete physical exam.   She reports consuming a diabetic, low calorie diet.    Walks for exercise     She generally feels well.   She reports sleeping well.    She does not have additional problems to discuss today.   Discussed the use of AI scribe software for clinical note transcription with the patient, who gave verbal consent to proceed.  History of Present Illness   The patient, a 68 year old individual with a history of hypertension and diabetes, presents for an annual physical. She reports that her last A1c, taken in December 2024, was 6.0, up from a previous level of 5.7. Despite this increase, the patient's diabetes is still considered well-controlled, although she expresses a desire to reduce her medication load. The patient's endocrinologist recently increased her medication dosage to 15 milligrams, which the patient's spouse believes has positively impacted her weight. The patient denies experiencing any hypoglycemic episodes since the medication adjustment.  The patient also reports a history of cholesterol issues and is currently on a statin medication. She expresses some confusion about the necessity of this medication, as previous healthcare providers did not prescribe it. The patient's diet is not strictly controlled for diabetes, but she reports not overeating. She does not count calories.  The patient's exercise routine consists of walking when possible, but she does not track her steps or distance. She expresses an intention to increase her physical activity upon retirement, which is imminent. She plans to join a community center gym for regular  workouts.  The patient has undergone a colonoscopy in the past year and a bone density test at the age of 40. She reports no issues with either test. She also reports receiving a COVID vaccine and a flu shot in the recent past. The patient's spouse is actively involved in her healthcare and accompanies her to appointments. The spouse provides additional information and context during the consultation.       [employer]: COVID and Shingrix vaccine and flu vaccine   Past Medical History:  Diagnosis Date   Anemia    Diabetes mellitus without complication (HCC)    Family history of malignant neoplasm of digestive organs 02/09/2022   Fibroids    Glaucoma    H/O: obesity    Hypertension    Varicose vein    Past Surgical History:  Procedure Laterality Date   BARTHOLIN GLAND CYST EXCISION  2010   BREAST REDUCTION SURGERY Bilateral 04/10/2022   Procedure: MAMMARY REDUCTION  (BREAST);  Surgeon: Glenna Fellows, MD;  Location: [facility];  Service: Plastics;  Laterality: Bilateral;   CESAREAN SECTION  [id number]   DILATION AND CURETTAGE OF UTERUS  1989   TUBAL LIGATION  1996   bilateral   Social History   Socioeconomic History   Marital status: Married    Spouse name: Barbaraann Rondo   Number of children: Not on file   Years of education: Not on file   Highest education level: Associate degree: occupational, Scientist, product/process development, or vocational program  Occupational History   Not on file  Tobacco Use   Smoking status: Never    Passive  exposure: Never   Smokeless tobacco: Never  Vaping Use   Vaping status: Never Used  Substance and Sexual Activity   Alcohol use: No   Drug use: No   Sexual activity: Yes    Partners: Male    Birth control/protection: Post-menopausal    Comment: 1st intercourse 53 yo-5 partners  Other Topics Concern   Not on file  Social History Narrative   Exercise walk 2-3 times for 30-40 minutes   Social Drivers of Corporate investment banker Strain:  Medium Risk (05/24/2023)   Overall Financial Resource Strain (CARDIA)    Difficulty of Paying Living Expenses: Somewhat hard  Food Insecurity: Patient Declined (05/24/2023)   Hunger Vital Sign    Worried About Running Out of Food in the Last Year: Patient declined    Ran Out of Food in the Last Year: Patient declined  Transportation Needs: No Transportation Needs (05/24/2023)   PRAPARE - Administrator, Civil Service (Medical): No    Lack of Transportation (Non-Medical): No  Physical Activity: Insufficiently Active (05/24/2023)   Exercise Vital Sign    Days of Exercise per Week: 2 days    Minutes of Exercise per Session: 20 min  Stress: No Stress Concern Present (05/24/2023)   [employer] of Occupational Health - Occupational Stress Questionnaire    Feeling of Stress : Only a little  Social Connections: Unknown (05/24/2023)   Social Connection and Isolation Panel [NHANES]    Frequency of Communication with Friends and Family: More than three times a week    Frequency of Social Gatherings with Friends and Family: Patient declined    Attends Religious Services: Patient declined    Database administrator or Organizations: No    Attends Engineer, structural: Not on file    Marital Status: Married  Catering manager Violence: Not on file   Family Status  Relation Name Status   Father Sharma Covert Deceased   Mother Sallye Ober Deceased   MGM Nellie Deceased   MGF Lisbeth Ply Deceased   Sister Nelva Bush Alive   Brother Conway Alive   Brother  Alive   Sister Millie Alive   Pat Oceanographer (Not Specified)  No partnership data on file   Family History  Problem Relation Age of Onset   Cancer Father        colon   Cancer Mother        per patient stomach   Diabetes Maternal Grandmother    Diabetes Maternal Grandfather    Mental illness Sister    Stroke Sister    Heart disease Brother    Diabetes Brother    Heart disease Sister    Stroke Paternal Aunt    Allergies  Allergen  Reactions   Ace Inhibitors Cough     Medications: Outpatient Medications Prior to Visit  Medication Sig   carvedilol (COREG) 25 MG tablet Take 1 tablet (25 mg total) by mouth 2 (two) times daily with a meal.   dorzolamide-timolol (COSOPT) 2-0.5 % ophthalmic solution Place 1 drop into both eyes 2 (two) times daily.   glucose blood (ONETOUCH VERIO) test strip 1 each by Other route daily. And lancets 1/day   hydrochlorothiazide (HYDRODIURIL) 25 MG tablet Take 0.5 tablets (12.5 mg total) by mouth daily.   hydrochlorothiazide (HYDRODIURIL) 25 MG tablet Take 1 tablet (25 mg total) by mouth daily.   JARDIANCE 25 MG TABS tablet TAKE 1 TABLET DAILY   metFORMIN (GLUCOPHAGE-XR) 500 MG 24 hr tablet Take 2 tablets (  1,000 mg total) by mouth daily with breakfast.   montelukast (SINGULAIR) 10 MG tablet TAKE 1 TABLET DAILY   Multiple Vitamin (MULTIVITAMIN) tablet Take 1 tablet by mouth daily.   Netarsudil Dimesylate 0.02 % SOLN Apply to eye. Instill 1 drop in both eyes every evening   repaglinide (PRANDIN) 2 MG tablet Take 1 tablet as needed when sugars are greater than 200   rosuvastatin (CRESTOR) 5 MG tablet Take 1 tablet (5 mg total) by mouth daily.   telmisartan (MICARDIS) 80 MG tablet TAKE 1 TABLET DAILY   tirzepatide (MOUNJARO) 15 MG/0.5ML Pen Inject 15 mg into the skin once a week.   zolpidem (AMBIEN) 10 MG tablet TAKE 1 TABLET AT BEDTIME AS NEEDED FOR SLEEP   nebivolol (BYSTOLIC) 5 MG tablet Take 1 tablet (5 mg total) by mouth daily for 14 days, THEN 1 tablet (5 mg total) every other day for 7 days.   No facility-administered medications prior to visit.    Review of Systems  Last CBC Lab Results  Component Value Date   WBC 6.1 05/02/2022   HGB 11.4 05/02/2022   HCT 35.2 05/02/2022   MCV 82 05/02/2022   MCH 26.5 (L) 05/02/2022   RDW 12.3 05/02/2022   PLT 301 05/02/2022   Last metabolic panel Lab Results  Component Value Date   GLUCOSE 87 09/14/2022   NA 140 09/14/2022   K 3.6  09/14/2022   CL 105 09/14/2022   CO2 28 09/14/2022   BUN 18 09/14/2022   CREATININE 0.94 09/14/2022   EGFR 89 05/02/2022   CALCIUM 9.6 09/14/2022   PROT 6.6 09/14/2022   ALBUMIN 4.1 05/02/2022   LABGLOB 2.5 05/02/2022   AGRATIO 1.6 05/02/2022   BILITOT 0.4 09/14/2022   ALKPHOS 84 05/02/2022   AST 17 09/14/2022   ALT 19 09/14/2022   ANIONGAP 6 04/04/2022   Last lipids Lab Results  Component Value Date   CHOL 154 09/14/2022   HDL 48 (L) 09/14/2022   LDLCALC 79 09/14/2022   TRIG 171 (H) 09/14/2022   CHOLHDL 3.2 09/14/2022   Last hemoglobin A1c Lab Results  Component Value Date   HGBA1C 6.0 (A) 04/29/2023   Last thyroid functions Lab Results  Component Value Date   TSH 1.170 11/20/2016       Objective    BP 129/67 (BP Location: Right Arm, Patient Position: Sitting, Cuff Size: Normal)   Pulse 67   Ht 5\' 4"  (1.626 m)   Wt 170 lb (77.1 kg)   LMP 01/15/2011   SpO2 97%   BMI 29.18 kg/m  BP Readings from Last 3 Encounters:  05/28/23 129/67  04/29/23 130/70  02/11/23 126/73   Wt Readings from Last 3 Encounters:  05/28/23 170 lb (77.1 kg)  04/29/23 173 lb 12.8 oz (78.8 kg)  02/11/23 173 lb (78.5 kg)        Physical Exam Vitals reviewed.  Constitutional:      General: She is not in acute distress.    Appearance: Normal appearance. She is not ill-appearing, toxic-appearing or diaphoretic.  HENT:     Head: Normocephalic and atraumatic.     Right Ear: Tympanic membrane and external ear normal. There is no impacted cerumen.     Left Ear: Tympanic membrane and external ear normal. There is no impacted cerumen.     Nose: Nose normal.     Mouth/Throat:     Pharynx: Oropharynx is clear.  Eyes:     General: No scleral icterus.  Extraocular Movements: Extraocular movements intact.     Conjunctiva/sclera: Conjunctivae normal.     Pupils: Pupils are equal, round, and reactive to light.  Cardiovascular:     Rate and Rhythm: Normal rate and regular rhythm.      Pulses: Normal pulses.     Heart sounds: Normal heart sounds. No murmur heard.    No friction rub. No gallop.  Pulmonary:     Effort: Pulmonary effort is normal. No respiratory distress.     Breath sounds: Normal breath sounds. No wheezing, rhonchi or rales.  Abdominal:     General: Bowel sounds are normal. There is no distension.     Palpations: Abdomen is soft. There is no mass.     Tenderness: There is no abdominal tenderness. There is no guarding.  Musculoskeletal:        General: No deformity.     Cervical back: Normal range of motion and neck supple. No rigidity.     Right lower leg: No edema.     Left lower leg: No edema.  Lymphadenopathy:     Cervical: No cervical adenopathy.  Skin:    General: Skin is warm.     Capillary Refill: Capillary refill takes less than 2 seconds.     Findings: No erythema or rash.  Neurological:     General: No focal deficit present.     Mental Status: She is alert and oriented to person, place, and time.     Motor: No weakness.     Gait: Gait normal.  Psychiatric:        Mood and Affect: Mood normal.        Behavior: Behavior normal.       Last depression screening scores    05/28/2023   10:19 AM 08/06/2022    9:18 AM 02/09/2022   10:38 AM  PHQ 2/9 Scores  PHQ - 2 Score 3 0 0  PHQ- 9 Score 4 2     Last fall risk screening    08/06/2022    9:17 AM  Fall Risk   Falls in the past year? 0  Number falls in past yr: 0  Injury with Fall? 0  Risk for fall due to : No Fall Risks    Last Audit-C alcohol use screening    05/24/2023    4:47 PM  Alcohol Use Disorder Test (AUDIT)  1. How often do you have a drink containing alcohol? 0  3. How often do you have six or more drinks on one occasion? 0   A score of 3 or more in women, and 4 or more in men indicates increased risk for alcohol abuse, EXCEPT if all of the points are from question 1   No results found for any visits on 05/28/23.  Assessment & Plan    Routine Health Maintenance  and Physical Exam  Immunization History  Administered Date(s) Administered   Influenza, Seasonal, Injecte, Preservative Fre 05/29/2012   Influenza,inj,Quad PF,6+ Mos 02/26/2013, 01/14/2014, 04/14/2015, 02/21/2017, 04/28/2018, 03/31/2019, 02/27/2022   Influenza-Unspecified 02/23/2017   Moderna Sars-Covid-2 Vaccination 06/29/2019, 08/04/2019   PNEUMOCOCCAL CONJUGATE-20 05/15/2021   Pneumococcal Conjugate-13 02/22/2014   Pneumococcal Polysaccharide-23 02/21/2017   Pneumococcal-Unspecified 12/30/2008, 05/07/2010   Tdap 11/20/2016   Unspecified SARS-COV-2 Vaccination 03/19/2022   Zoster Recombinant(Shingrix) 03/13/2021, 05/15/2021    Health Maintenance  Topic Date Due   COVID-19 Vaccine (4 - 2024-25 season) 01/06/2023   MAMMOGRAM  01/13/2023   INFLUENZA VACCINE  08/05/2023 (Originally 12/06/2022)   HIV Screening  05/27/2024 (Originally 07/19/1970)   OPHTHALMOLOGY EXAM  08/09/2023   Diabetic kidney evaluation - eGFR measurement  09/14/2023   Diabetic kidney evaluation - Urine ACR  09/14/2023   HEMOGLOBIN A1C  10/28/2023   FOOT EXAM  01/09/2024   DTaP/Tdap/Td (2 - Td or Tdap) 11/21/2026   Cervical Cancer Screening (HPV/Pap Cotest)  01/13/2027   Colonoscopy  03/03/2033   Pneumonia Vaccine 52+ Years old  Completed   DEXA SCAN  Completed   Hepatitis C Screening  Completed   Zoster Vaccines- Shingrix  Completed   HPV VACCINES  Aged Out    Problem List Items Addressed This Visit       Cardiovascular and Mediastinum   Essential hypertension - Primary   Well-controlled blood pressure. - Monitor blood pressure regularly -continue current meds      Relevant Orders   CMP14+EGFR     Endocrine   Type 2 diabetes mellitus without complication, without long-term current use of insulin (HCC)   Well-controlled diabetes (A1c 6.0 in December 2024). On Mounjaro, maintaining blood sugar levels between 108-112 mg/dL. Discussed potential for reducing medication if control is maintained and risks  of hypoglycemia with higher doses. - Continue current diabetes management - Monitor blood glucose levels regularly - Emphasize diet and exercise in diabetes management -continue to follow up with endocrinology       Relevant Orders   Lipid panel   Urine microalbumin-creatinine with uACR     Other   Annual physical exam   Chronic conditions are stable  Patient was counseled on benefits of regular physical activity with goal of 150 minutes of moderate to vigurous intensity 4 days per week  Patient was counseled to consume well balanced diet of fruits, vegetables, limited saturated fats and limited sugary foods and beverages with emphasis on consuming 6-8 glasses of water daily  Screening recommended today: A1c, lipids,CMP,CBC  Colon cancer screening: UTD   Cervical CA screening: UTD   Mammogram: completed in Oct 2024    Vaccines recommended today: COVID                Return in about 6 months (around 11/25/2023) for Insomnia.      Ronnald Ramp, MD  [facility] [phone] (phone) [phone] (fax)  [facility]

## 2023-05-28 NOTE — Assessment & Plan Note (Signed)
Well-controlled diabetes (A1c 6.0 in December 2024). On Mounjaro, maintaining blood sugar levels between 108-112 mg/dL. Discussed potential for reducing medication if control is maintained and risks of hypoglycemia with higher doses. - Continue current diabetes management - Monitor blood glucose levels regularly - Emphasize diet and exercise in diabetes management -continue to follow up with endocrinology

## 2023-05-28 NOTE — Assessment & Plan Note (Signed)
Chronic conditions are stable  Patient was counseled on benefits of regular physical activity with goal of 150 minutes of moderate to vigurous intensity 4 days per week  Patient was counseled to consume well balanced diet of fruits, vegetables, limited saturated fats and limited sugary foods and beverages with emphasis on consuming 6-8 glasses of water daily  Screening recommended today: A1c, lipids,CMP,CBC  Colon cancer screening: UTD   Cervical CA screening: UTD   Mammogram: completed in Oct 2024    Vaccines recommended today: COVID

## 2023-05-29 ENCOUNTER — Encounter: Payer: Self-pay | Admitting: Family Medicine

## 2023-05-30 ENCOUNTER — Telehealth: Payer: Self-pay

## 2023-05-30 ENCOUNTER — Other Ambulatory Visit: Payer: Self-pay

## 2023-05-30 ENCOUNTER — Other Ambulatory Visit: Payer: Self-pay | Admitting: "Endocrinology

## 2023-05-30 DIAGNOSIS — Z794 Long term (current) use of insulin: Secondary | ICD-10-CM

## 2023-05-30 LAB — CMP14+EGFR
ALT: 23 [IU]/L (ref 0–32)
AST: 22 [IU]/L (ref 0–40)
Albumin: 4.5 g/dL (ref 3.9–4.9)
Alkaline Phosphatase: 92 [IU]/L (ref 44–121)
BUN/Creatinine Ratio: 14 (ref 12–28)
BUN: 14 mg/dL (ref 8–27)
Bilirubin Total: 0.4 mg/dL (ref 0.0–1.2)
CO2: 25 mmol/L (ref 20–29)
Calcium: 9.8 mg/dL (ref 8.7–10.3)
Chloride: 103 mmol/L (ref 96–106)
Creatinine, Ser: 0.97 mg/dL (ref 0.57–1.00)
Globulin, Total: 2.5 g/dL (ref 1.5–4.5)
Glucose: 103 mg/dL — ABNORMAL HIGH (ref 70–99)
Potassium: 4 mmol/L (ref 3.5–5.2)
Sodium: 143 mmol/L (ref 134–144)
Total Protein: 7 g/dL (ref 6.0–8.5)
eGFR: 65 mL/min/{1.73_m2} (ref >59)

## 2023-05-30 LAB — LIPID PANEL
Chol/HDL Ratio: 2.1 {ratio} (ref 0.0–4.4)
Cholesterol, Total: 120 mg/dL (ref 100–199)
HDL: 57 mg/dL (ref >39)
LDL Chol Calc (NIH): 47 mg/dL (ref 0–99)
Triglycerides: 81 mg/dL (ref 0–149)
VLDL Cholesterol Cal: 16 mg/dL (ref 5–40)

## 2023-05-30 LAB — MICROALBUMIN / CREATININE URINE RATIO: Creatinine, Urine: 27.6 mg/dL

## 2023-05-30 MED ORDER — REPAGLINIDE 2 MG PO TABS: ORAL_TABLET | ORAL | 0 refills | Status: DC

## 2023-05-30 NOTE — Telephone Encounter (Signed)
Pharmacy called needing clarification for the maximum amount patient can take per or frequency in addition to the "as needed"

## 2023-06-03 ENCOUNTER — Other Ambulatory Visit: Payer: Self-pay

## 2023-06-03 DIAGNOSIS — E1165 Type 2 diabetes mellitus with hyperglycemia: Secondary | ICD-10-CM

## 2023-06-03 MED ORDER — REPAGLINIDE 2 MG PO TABS: ORAL_TABLET | ORAL | 0 refills | Status: DC

## 2023-06-07 ENCOUNTER — Other Ambulatory Visit: Payer: Self-pay | Admitting: Family Medicine

## 2023-06-07 DIAGNOSIS — F5101 Primary insomnia: Secondary | ICD-10-CM

## 2023-06-07 NOTE — Telephone Encounter (Signed)
Requested medication (s) are due for refill today: yes  Requested medication (s) are on the active medication list: yes  Last refill:  05/10/23 #30  Future visit scheduled: yes  Notes to clinic:  med not delegated to NT to RF   Requested Prescriptions  Pending Prescriptions Disp Refills   zolpidem (AMBIEN) 10 MG tablet [Pharmacy Med Name: ZOLPIDEM TARTRATE TABS 10MG ] 30 tablet 0    Sig: TAKE 1 TABLET AT BEDTIME AS NEEDED FOR SLEEP     Not Delegated - Psychiatry:  Anxiolytics/Hypnotics Failed - 06/07/2023  5:33 PM      Failed - This refill cannot be delegated      Failed - Urine Drug Screen completed in last 360 days      Passed - Valid encounter within last 6 months    Recent Outpatient Visits           1 week ago Essential hypertension   [place] [facility] Simmons-Robinson, Tawanna Cooler, MD   3 months ago Encounter for screening mammogram for malignant neoplasm of breast   [place] [facility] Simmons-Robinson, [place], MD   5 months ago Essential hypertension   [place] [facility] Simmons-Robinson, [place], MD   7 months ago Essential hypertension   [place] [facility] Simmons-Robinson, [place], MD   10 months ago Primary insomnia   [place] [facility] Simmons-Robinson, [place], MD       Future Appointments             In 5 months Simmons-Robinson, Tawanna Cooler, MD [facility]

## 2023-06-28 ENCOUNTER — Other Ambulatory Visit: Payer: Self-pay | Admitting: "Endocrinology

## 2023-06-28 NOTE — Telephone Encounter (Signed)
Crestor refill request complete

## 2023-07-08 ENCOUNTER — Other Ambulatory Visit: Payer: Self-pay

## 2023-07-08 DIAGNOSIS — E1169 Type 2 diabetes mellitus with other specified complication: Secondary | ICD-10-CM

## 2023-07-08 MED ORDER — METFORMIN HCL ER 500 MG PO TB24: 1000.0000 mg | ORAL_TABLET | Freq: Every day | ORAL | 3 refills | Status: DC

## 2023-07-08 NOTE — Progress Notes (Signed)
 Rx sent pt informed.  Requested Prescriptions   Signed Prescriptions Disp Refills   metFORMIN (GLUCOPHAGE-XR) 500 MG 24 hr tablet 180 tablet 3    Sig: Take 2 tablets (1,000 mg total) by mouth daily with breakfast.

## 2023-07-10 ENCOUNTER — Other Ambulatory Visit: Payer: Self-pay | Admitting: Family Medicine

## 2023-07-10 DIAGNOSIS — F5101 Primary insomnia: Secondary | ICD-10-CM

## 2023-07-11 NOTE — Telephone Encounter (Signed)
 Requested medication (s) are due for refill today - yes  Requested medication (s) are on the active medication list -yes  Future visit scheduled -yes  Last refill: 06/10/23 #30  Notes to clinic: non delegated Rx  Requested Prescriptions  Pending Prescriptions Disp Refills   zolpidem (AMBIEN) 10 MG tablet [Pharmacy Med Name: ZOLPIDEM TARTRATE TABS 10MG ] 30 tablet 0    Sig: TAKE 1 TABLET AT BEDTIME AS NEEDED FOR SLEEP     Not Delegated - Psychiatry:  Anxiolytics/Hypnotics Failed - 07/11/2023  8:50 AM      Failed - This refill cannot be delegated      Failed - Urine Drug Screen completed in last 360 days      Passed - Valid encounter within last 6 months    Recent Outpatient Visits           1 month ago Essential hypertension   [place] [facility] Simmons-Robinson, [place], MD   5 months ago Encounter for screening mammogram for malignant neoplasm of breast   [place] [facility] Simmons-Robinson, [place], MD   7 months ago Essential hypertension   [place] [facility] Simmons-Robinson, [place], MD   8 months ago Essential hypertension   [place] [facility] Simmons-Robinson, [place], MD   11 months ago Primary insomnia   [place] [facility] Simmons-Robinson, [place], MD       Future Appointments             In 4 months Simmons-Robinson, Tawanna Cooler, MD [facility]               Requested Prescriptions  Pending Prescriptions Disp Refills   zolpidem (AMBIEN) 10 MG tablet [Pharmacy Med Name: ZOLPIDEM TARTRATE TABS 10MG ] 30 tablet 0    Sig: TAKE 1 TABLET AT BEDTIME AS NEEDED FOR SLEEP     Not Delegated - Psychiatry:  Anxiolytics/Hypnotics Failed - 07/11/2023  8:50 AM      Failed - This refill cannot be delegated      Failed - Urine Drug Screen completed in last 360 days      Passed - Valid encounter within last 6 months    Recent Outpatient Visits            1 month ago Essential hypertension   [place] [facility] Simmons-Robinson, [place], MD   5 months ago Encounter for screening mammogram for malignant neoplasm of breast   [place] [employer] Simmons-Robinson, [place], MD   7 months ago Essential hypertension   [place] [facility] Simmons-Robinson, [place], MD   8 months ago Essential hypertension   [place] [facility] Simmons-Robinson, [place], MD   11 months ago Primary insomnia   [place] [facility] Simmons-Robinson, [place], MD       Future Appointments             In 4 months Simmons-Robinson, Tawanna Cooler, MD [facility]

## 2023-08-19 ENCOUNTER — Other Ambulatory Visit: Payer: Self-pay | Admitting: Family Medicine

## 2023-08-19 DIAGNOSIS — F5101 Primary insomnia: Secondary | ICD-10-CM

## 2023-08-20 NOTE — Telephone Encounter (Signed)
 Requested medications are due for refill today.  yes  Requested medications are on the active medications list.  yes  Last refill. 07/11/2023 #30 0 rf  Future visit scheduled.   yes  Notes to clinic.  Reill not delegated.    Requested Prescriptions  Pending Prescriptions Disp Refills   zolpidem (AMBIEN) 10 MG tablet [Pharmacy Med Name: ZOLPIDEM TARTRATE TABS 10MG ] 30 tablet 0    Sig: TAKE 1 TABLET AT BEDTIME AS NEEDED FOR SLEEP     Not Delegated - Psychiatry:  Anxiolytics/Hypnotics Failed - 08/20/2023  3:20 PM      Failed - This refill cannot be delegated      Failed - Urine Drug Screen completed in last 360 days      Failed - Valid encounter within last 6 months    Recent Outpatient Visits           1 year ago Encounter to establish care   [facility], [name] J, NP   3 years ago Type 2 diabetes mellitus with other ophthalmic complication, with long-term current use of insulin (HCC)   Primary [facility] at [facility], Cathye Coca, MD   4 years ago Health maintenance examination   Primary Care at [facility], [place] A, MD   4 years ago Type 2 diabetes mellitus with other ophthalmic complication, with long-term current use of insulin (HCC)   Primary Care at [facility], Zoe A, MD   5 years ago Type 2 diabetes mellitus with other ophthalmic complication, with long-term current use of insulin [facility])   Primary Care at [facility], Cathye Coca, MD       Future Appointments             In 3 months Simmons-Robinson, Judyann Number, MD [facility]

## 2023-08-21 ENCOUNTER — Other Ambulatory Visit: Payer: Self-pay | Admitting: Family Medicine

## 2023-08-22 NOTE — Telephone Encounter (Signed)
 Requested Prescriptions  Pending Prescriptions Disp Refills   montelukast (SINGULAIR) 10 MG tablet [Pharmacy Med Name: MONTELUKAST SODIUM TABS 10MG ] 90 tablet 3    Sig: TAKE 1 TABLET DAILY     Pulmonology:  Leukotriene Inhibitors Failed - 08/22/2023 10:11 AM      Failed - Valid encounter within last 12 months    Recent Outpatient Visits           1 year ago Encounter to establish care   [facility], [name] J, NP   3 years ago Type 2 diabetes mellitus with other ophthalmic complication, with long-term current use of insulin (HCC)   Primary Care at [facility], Cathye Coca, MD   4 years ago Health maintenance examination   Primary Care at [facility], [place] A, MD   4 years ago Type 2 diabetes mellitus with other ophthalmic complication, with long-term current use of insulin (HCC)   Primary Care at [facility], Zoe A, MD   5 years ago Type 2 diabetes mellitus with other ophthalmic complication, with long-term current use of insulin [facility])   Primary Care at [facility], Cathye Coca, MD       Future Appointments             In 3 months Simmons-Robinson, Judyann Number, MD [facility]

## 2023-09-02 ENCOUNTER — Ambulatory Visit (INDEPENDENT_AMBULATORY_CARE_PROVIDER_SITE_OTHER): Payer: 59 | Admitting: "Endocrinology

## 2023-09-02 ENCOUNTER — Encounter: Payer: Self-pay | Admitting: "Endocrinology

## 2023-09-02 VITALS — BP 106/70 | HR 87 | Ht 64.0 in | Wt 166.0 lb

## 2023-09-02 DIAGNOSIS — Z7985 Long-term (current) use of injectable non-insulin antidiabetic drugs: Secondary | ICD-10-CM | POA: Diagnosis not present

## 2023-09-02 DIAGNOSIS — Z7984 Long term (current) use of oral hypoglycemic drugs: Secondary | ICD-10-CM

## 2023-09-02 DIAGNOSIS — E1140 Type 2 diabetes mellitus with diabetic neuropathy, unspecified: Secondary | ICD-10-CM

## 2023-09-02 DIAGNOSIS — E7800 Pure hypercholesterolemia, unspecified: Secondary | ICD-10-CM | POA: Diagnosis not present

## 2023-09-02 LAB — POCT GLYCOSYLATED HEMOGLOBIN (HGB A1C): Hemoglobin A1C: 6.2 % — AB (ref 4.0–5.6)

## 2023-09-02 NOTE — Patient Instructions (Signed)

## 2023-09-02 NOTE — Progress Notes (Signed)
 Outpatient Endocrinology Note Heidi Newcomer, MD  09/02/23   Heidi Howell Nov 08, 1955 [id number]  Referring Provider: Renea Howell* Primary Care Provider: Mimi Alt, MD Reason for consultation: Subjective   Assessment & Plan  Diagnoses and all orders for this visit:  Controlled type 2 diabetes with neuropathy (HCC) -     POCT glycosylated hemoglobin (Hb A1C)  Long term (current) use of oral hypoglycemic drugs  Long-term (current) use of injectable non-insulin  antidiabetic drugs  Pure hypercholesterolemia    Diabetes complicated by neuropathy, retinopathy Hba1c goal less than 7.0, current Hba1c is 6.2%. Will recommend for the following change of medications to: Metformin  ER 500 mg two pills a day Jardiance  25 mg a day Mounjaro  15 mg weekly     Prandin  2 mg bd before meal ONLY if BG is >200   No known contraindications to any of above medications No history of MEN syndrome/medullary thyroid cancer/pancreatitis or pancreatic cancer in self or family  Hyperlipidemia -Last LDL at goal: 38 -continue crestor  5 mg qd  -Follow low fat diet and exercise   -Blood pressure goal <140/90 - Microalbumin/creatinine at goal < 30 - on ACE/ARB telmisartan  80 mg qd -diet changes including salt restriction -limit eating outside -counseled BP targets per standards of diabetes care -Uncontrolled blood pressure can lead to retinopathy, nephropathy and cardiovascular and atherosclerotic heart disease  Reviewed and counseled on: -A1C target -Blood sugar targets -Complications of uncontrolled diabetes  -Checking blood sugar before meals and bedtime and bring log next visit -All medications with mechanism of action and side effects -Hypoglycemia management: rule of 15's, Glucagon Emergency Kit and medical alert ID -low-carb low-fat plate-method diet -At least 20 minutes of physical activity per day -Annual dilated retinal eye exam and foot  exam -compliance and follow up needs -follow up as scheduled or earlier if problem gets worse  Call if blood sugar is less than 70 or consistently above 250    Take a 15 gm snack of carbohydrate at bedtime before you go to sleep if your blood sugar is less than 100.    If you are going to fast after midnight for a test or procedure, ask your physician for instructions on how to reduce/decrease your insulin  dose.    Call if blood sugar is less than 70 or consistently above 250  -Treating a low sugar by rule of 15  (15 gms of sugar every 15 min until sugar is more than 70) If you feel your sugar is low, test your sugar to be sure If your sugar is low (less than 70), then take 15 grams of a fast acting Carbohydrate (3-4 glucose tablets or glucose gel or 4 ounces of juice or regular soda) Recheck your sugar 15 min after treating low to make sure it is more than 70 If sugar is still less than 70, treat again with 15 grams of carbohydrate          Don't drive the hour of hypoglycemia  If unconscious/unable to eat or drink by mouth, use glucagon injection or nasal spray baqsimi and call 911. Can repeat again in 15 min if still unconscious.  Return in about 3 months (around 12/02/2023).   I have reviewed current medications, nurse's notes, allergies, vital signs, past medical and surgical history, family medical history, and social history for this encounter. Counseled patient on symptoms, examination findings, lab findings, imaging results, treatment decisions and monitoring and prognosis. The patient understood the recommendations and agrees with the treatment  plan. All questions regarding treatment plan were fully answered.  Heidi Newcomer, MD  09/02/23    History of Present Illness Heidi Howell is a 68 y.o. year old female who presents for evaluation of Type 2 diabetes mellitus.  Heidi Howell was first diagnosed in 2007.   Diabetes education +  Current regimen:   Prandin  2 mg bd  before BF and dinner, metformin  ER 500 mg two pills a day, Jardiance  25 mg a day, Mounjaro  12.5 mg weekly    Previous history: Non-insulin  hypoglycemic drugs previously used: Metformin , Trulicity , Prandin  Rybelsus  was started in 11/20 Insulin  was started in 2014 and more recently was taking Humulin R   COMPLICATIONS -  MI/Stroke -  retinopathy, sees ophthal annually +  neuropathy -  nephropathy  BLOOD SUGAR DATA 53-141 on meter Checks qam/qhs  Physical Exam  BP 106/70   Pulse 87   Ht 5\' 4"  (1.626 m)   Wt 166 lb (75.3 kg)   LMP 01/15/2011   SpO2 96%   BMI 28.49 kg/m    Constitutional: well developed, well nourished Head: normocephalic, atraumatic Eyes: sclera anicteric, no redness Neck: supple Lungs: normal respiratory effort Neurology: alert and oriented Skin: dry, no appreciable rashes Musculoskeletal: no appreciable defects Psychiatric: normal mood and affect Diabetic Foot Exam - Simple   No data filed      Current Medications Patient's Medications  New Prescriptions   No medications on file  Previous Medications   CARVEDILOL  (COREG ) 25 MG TABLET    Take 1 tablet (25 mg total) by mouth 2 (two) times daily with a meal.   DORZOLAMIDE-TIMOLOL (COSOPT) 2-0.5 % OPHTHALMIC SOLUTION    Place 1 drop into both eyes 2 (two) times daily.   GLUCOSE BLOOD (ONETOUCH VERIO) TEST STRIP    1 each by Other route daily. And lancets 1/day   HYDROCHLOROTHIAZIDE  (HYDRODIURIL ) 25 MG TABLET    Take 0.5 tablets (12.5 mg total) by mouth daily.   HYDROCHLOROTHIAZIDE  (HYDRODIURIL ) 25 MG TABLET    Take 1 tablet (25 mg total) by mouth daily.   JARDIANCE  25 MG TABS TABLET    TAKE 1 TABLET DAILY   METFORMIN  (GLUCOPHAGE -XR) 500 MG 24 HR TABLET    Take 2 tablets (1,000 mg total) by mouth daily with breakfast.   MONTELUKAST  (SINGULAIR ) 10 MG TABLET    TAKE 1 TABLET DAILY   MULTIPLE VITAMIN (MULTIVITAMIN) TABLET    Take 1 tablet by mouth daily.   NEBIVOLOL  (BYSTOLIC ) 5 MG TABLET    Take 1 tablet  (5 mg total) by mouth daily for 14 days, THEN 1 tablet (5 mg total) every other day for 7 days.   NETARSUDIL DIMESYLATE 0.02 % SOLN    Apply to eye. Instill 1 drop in both eyes every evening   REPAGLINIDE  (PRANDIN ) 2 MG TABLET    Take 1 tablet as needed when sugars are greater than 200, max dose twice a day   ROSUVASTATIN  (CRESTOR ) 5 MG TABLET    TAKE 1 TABLET DAILY   TELMISARTAN  (MICARDIS ) 80 MG TABLET    TAKE 1 TABLET DAILY   TIRZEPATIDE  (MOUNJARO ) 15 MG/0.5ML PEN    Inject 15 mg into the skin once a week.   ZOLPIDEM  (AMBIEN ) 10 MG TABLET    TAKE 1 TABLET AT BEDTIME AS NEEDED FOR SLEEP  Modified Medications   No medications on file  Discontinued Medications   No medications on file    Allergies Allergies  Allergen Reactions   Ace Inhibitors Cough  Past Medical History Past Medical History:  Diagnosis Date   Anemia    Diabetes mellitus without complication (HCC)    Family history of malignant neoplasm of digestive organs 02/09/2022   Fibroids    Glaucoma    H/O: obesity    Hypertension    Varicose vein     Past Surgical History Past Surgical History:  Procedure Laterality Date   BARTHOLIN GLAND CYST EXCISION  2010   BREAST REDUCTION SURGERY Bilateral 04/10/2022   Procedure: MAMMARY REDUCTION  (BREAST);  Surgeon: Alger Infield, MD;  Location: [facility];  Service: Plastics;  Laterality: Bilateral;   CESAREAN SECTION  [id number],8119   DILATION AND CURETTAGE OF UTERUS  1989   TUBAL LIGATION  1996   bilateral    Family History family history includes Cancer in her father and mother; Diabetes in her brother, maternal grandfather, and maternal grandmother; Heart disease in her brother and sister; Mental illness in her sister; Stroke in her paternal aunt and sister.  Social History Social History   Socioeconomic History   Marital status: Married    Spouse name: Arden Kotyk   Number of children: Not on file   Years of education: Not on file   Highest  education level: Associate degree: occupational, Scientist, product/process development, or vocational program  Occupational History   Not on file  Tobacco Use   Smoking status: Never    Passive exposure: Never   Smokeless tobacco: Never  Vaping Use   Vaping status: Never Used  Substance and Sexual Activity   Alcohol use: No   Drug use: No   Sexual activity: Yes    Partners: Male    Birth control/protection: Post-menopausal    Comment: 1st intercourse 32 yo-5 partners  Other Topics Concern   Not on file  Social History Narrative   Exercise walk 2-3 times for 30-40 minutes   Social Drivers of Health   Financial Resource Strain: Medium Risk (05/24/2023)   Overall Financial Resource Strain (CARDIA)    Difficulty of Paying Living Expenses: Somewhat hard  Food Insecurity: Patient Declined (05/24/2023)   Hunger Vital Sign    Worried About Running Out of Food in the Last Year: Patient declined    Ran Out of Food in the Last Year: Patient declined  Transportation Needs: No Transportation Needs (05/24/2023)   PRAPARE - Administrator, Civil Service (Medical): No    Lack of Transportation (Non-Medical): No  Physical Activity: Insufficiently Active (05/24/2023)   Exercise Vital Sign    Days of Exercise per Week: 2 days    Minutes of Exercise per Session: 20 min  Stress: No Stress Concern Present (05/24/2023)   Heidi Howell    Feeling of Stress : Only a little  Social Connections: Unknown (05/24/2023)   Social Connection and Isolation Panel [NHANES]    Frequency of Communication with Friends and Family: More than three times a week    Frequency of Social Gatherings with Friends and Family: Patient declined    Attends Religious Services: Patient declined    Active Member of Clubs or Organizations: No    Attends Banker Meetings: Not on file    Marital Status: Married  Intimate Partner Violence: Not on file    Lab Results   Component Value Date   HGBA1C 6.2 (A) 09/02/2023   HGBA1C 6.0 (A) 04/29/2023   HGBA1C 5.7 (A) 12/27/2022   Lab Results  Component Value Date   CHOL  120 05/28/2023   Lab Results  Component Value Date   HDL 57 05/28/2023   Lab Results  Component Value Date   LDLCALC 47 05/28/2023   Lab Results  Component Value Date   TRIG 81 05/28/2023   Lab Results  Component Value Date   CHOLHDL 2.1 05/28/2023   Lab Results  Component Value Date   CREATININE 0.97 05/28/2023   Lab Results  Component Value Date   GFR 65.76 02/20/2022   Lab Results  Component Value Date   MICROALBUR <0.2 09/14/2022      Component Value Date/Time   NA 143 05/28/2023 1054   K 4.0 05/28/2023 1054   CL 103 05/28/2023 1054   CO2 25 05/28/2023 1054   GLUCOSE 103 (H) 05/28/2023 1054   GLUCOSE 87 09/14/2022 1548   BUN 14 05/28/2023 1054   CREATININE 0.97 05/28/2023 1054   CREATININE 0.94 09/14/2022 1548   CALCIUM  9.8 05/28/2023 1054   PROT 7.0 05/28/2023 1054   ALBUMIN 4.5 05/28/2023 1054   AST 22 05/28/2023 1054   Howell 23 05/28/2023 1054   ALKPHOS 92 05/28/2023 1054   BILITOT 0.4 05/28/2023 1054   GFRNONAA >60 04/04/2022 1326   GFRNONAA >89 09/23/2014 1226   GFRAA 91 03/31/2019 0901   GFRAA >89 09/23/2014 1226      Latest Ref Rng & Units 05/28/2023   10:54 AM 09/14/2022    3:48 PM 05/02/2022   10:29 AM  BMP  Glucose 70 - 99 mg/dL 604  87  80   BUN 8 - 27 mg/dL 14  18  9    Creatinine 0.57 - 1.00 mg/dL 5.40  9.81  1.91   BUN/Creat Ratio 12 - 28 14  SEE NOTE:  12   Sodium 134 - 144 mmol/L 143  140  142   Potassium 3.5 - 5.2 mmol/L 4.0  3.6  3.9   Chloride 96 - 106 mmol/L 103  105  104   CO2 20 - 29 mmol/L 25  28  24    Calcium  8.7 - 10.3 mg/dL 9.8  9.6  9.8        Component Value Date/Time   WBC 6.1 05/02/2022 1029   WBC 6.6 04/04/2022 1326   RBC 4.30 05/02/2022 1029   RBC 4.47 04/04/2022 1326   HGB 11.4 05/02/2022 1029   HCT 35.2 05/02/2022 1029   PLT 301 05/02/2022 1029   MCV 82  05/02/2022 1029   MCH 26.5 (L) 05/02/2022 1029   MCH 27.7 04/04/2022 1326   MCHC 32.4 05/02/2022 1029   MCHC 33.1 04/04/2022 1326   RDW 12.3 05/02/2022 1029   LYMPHSABS 2.6 05/02/2022 1029   MONOABS 0.6 04/04/2022 1326   EOSABS 0.3 05/02/2022 1029   BASOSABS 0.1 05/02/2022 1029     Parts of this note may have been dictated using voice recognition software. There may be variances in spelling and vocabulary which are unintentional. Not all errors are proofread. Please notify the [facility] if any discrepancies are noted or if the meaning of any statement is not clear.

## 2023-09-15 ENCOUNTER — Other Ambulatory Visit: Payer: Self-pay | Admitting: Family Medicine

## 2023-09-15 DIAGNOSIS — F5101 Primary insomnia: Secondary | ICD-10-CM

## 2023-09-17 NOTE — Telephone Encounter (Signed)
 Requested medications are due for refill today.  yes  Requested medications are on the active medications list.  yes  Last refill. 08/21/2023 #30 0 rf  Future visit scheduled.   yes  Notes to clinic.  Refill not delegated.    Requested Prescriptions  Pending Prescriptions Disp Refills   zolpidem  (AMBIEN ) 10 MG tablet [Pharmacy Med Name: ZOLPIDEM  TARTRATE TABS 10MG ] 30 tablet 0    Sig: TAKE 1 TABLET AT BEDTIME AS NEEDED FOR SLEEP     Not Delegated - Psychiatry:  Anxiolytics/Hypnotics Failed - 09/17/2023 12:51 PM      Failed - This refill cannot be delegated      Failed - Urine Drug Screen completed in last 360 days      Failed - Valid encounter within last 6 months    Recent Outpatient Visits           1 year ago Encounter to establish care   [facility], [name] J, NP   4 years ago Type 2 diabetes mellitus with other ophthalmic complication, with long-term current use of insulin  [facility])   Primary Care at [facility], Cathye Coca, MD   4 years ago Health maintenance examination   Primary Care at [facility], [place] A, MD   4 years ago Type 2 diabetes mellitus with other ophthalmic complication, with long-term current use of insulin  [facility])   Primary Care at [facility], Zoe A, MD   5 years ago Type 2 diabetes mellitus with other ophthalmic complication, with long-term current use of insulin  [facility])   Primary Care at [facility], Cathye Coca, MD       Future Appointments             In 2 months Simmons-Robinson, Judyann Number, MD [facility], [facility]

## 2023-10-22 ENCOUNTER — Other Ambulatory Visit: Payer: Self-pay | Admitting: Family Medicine

## 2023-10-22 DIAGNOSIS — F5101 Primary insomnia: Secondary | ICD-10-CM

## 2023-10-24 NOTE — Telephone Encounter (Signed)
 Needs appt

## 2023-10-24 NOTE — Telephone Encounter (Signed)
 Requested Prescriptions  Pending Prescriptions Disp Refills   empagliflozin  (JARDIANCE ) 25 MG TABS tablet [Pharmacy Med Name: JARDIANCE  TABS 25MG ] 90 tablet 0    Sig: TAKE 1 TABLET DAILY     Endocrinology:  Diabetes - SGLT2 Inhibitors Failed - 10/24/2023 11:23 AM      Failed - Valid encounter within last 6 months    Recent Outpatient Visits           1 year ago Encounter to establish care   [facility] at [facility], [name] J, NP   4 years ago Type 2 diabetes mellitus with other ophthalmic complication, with long-term current use of insulin  (HCC)   Primary Care at [facility], Cathye Coca, MD   4 years ago Health maintenance examination   Primary Care at [facility], [place] A, MD   4 years ago Type 2 diabetes mellitus with other ophthalmic complication, with long-term current use of insulin  [facility])   Primary Care at [facility], Zoe A, MD   5 years ago Type 2 diabetes mellitus with other ophthalmic complication, with long-term current use of insulin  [facility])   Primary Care at [facility], Cathye Coca, MD       Future Appointments             In 1 month Simmons-Robinson, Judyann Number, MD [facility], [facility]            Passed - Cr in normal range and within 360 days    Creat  Date Value Ref Range Status  09/14/2022 0.94 0.50 - 1.05 mg/dL Final   Creatinine, Ser  Date Value Ref Range Status  05/28/2023 0.97 0.57 - 1.00 mg/dL Final   Creatinine,U  Date Value Ref Range Status  12/06/2021 69.8 mg/dL Final   Creatinine, Urine  Date Value Ref Range Status  09/14/2022 56 20 - 275 mg/dL Final         Passed - HBA1C is between 0 and 7.9 and within 180 days    Hemoglobin A1C  Date Value Ref Range Status  09/02/2023 6.2 (A) 4.0 - 5.6 % Final   Hgb A1c MFr Bld  Date Value Ref Range Status  09/14/2022 5.9 (H) <5.7 % of total Hgb Final    Comment:    For someone without known diabetes, a hemoglobin  A1c value between 5.7% and 6.4%  is consistent with prediabetes and should be confirmed with a  follow-up test. . For someone with known diabetes, a value <7% indicates that their diabetes is well controlled. A1c targets should be individualized based on duration of diabetes, age, comorbid conditions, and other considerations. . This assay result is consistent with an increased risk of diabetes. . Currently, no consensus exists regarding use of hemoglobin A1c for diagnosis of diabetes for children. .          Passed - eGFR in normal range and within 360 days    GFR, Est African American  Date Value Ref Range Status  09/23/2014 >89 mL/min Final   GFR calc Af Amer  Date Value Ref Range Status  03/31/2019 91 >59 mL/min/1.73 Final   GFR, Est Non African American  Date Value Ref Range Status  09/23/2014 >89 mL/min Final    Comment:      The estimated GFR is a calculation valid for adults (>=54 years old) that uses the CKD-EPI algorithm to adjust for age and sex. It is   not to be used for children, pregnant women,  hospitalized patients,    patients on dialysis, or with rapidly changing kidney function. According to the NKDEP, eGFR >89 is normal, 60-89 shows mild impairment, 30-59 shows moderate impairment, 15-29 shows severe impairment and <15 is ESRD.      GFR, Estimated  Date Value Ref Range Status  04/04/2022 >60 >60 mL/min Final    Comment:    (NOTE) Calculated using the CKD-EPI Creatinine Equation (2021)    GFR  Date Value Ref Range Status  02/20/2022 65.76 >60.00 mL/min Final    Comment:    Calculated using the CKD-EPI Creatinine Equation (2021)   eGFR  Date Value Ref Range Status  05/28/2023 65 >59 mL/min/1.73 Final          zolpidem  (AMBIEN ) 10 MG tablet [Pharmacy Med Name: ZOLPIDEM  TARTRATE TABS 10MG ] 30 tablet 0    Sig: TAKE 1 TABLET AT BEDTIME AS NEEDED FOR SLEEP     Not Delegated - Psychiatry:  Anxiolytics/Hypnotics Failed - 10/24/2023 11:23 AM      Failed - This refill  cannot be delegated      Failed - Urine Drug Screen completed in last 360 days      Failed - Valid encounter within last 6 months    Recent Outpatient Visits           1 year ago Encounter to establish care   [facility] at [facility], [name] J, NP   4 years ago Type 2 diabetes mellitus with other ophthalmic complication, with long-term current use of insulin  (HCC)   Primary Care at [facility], Cathye Coca, MD   4 years ago Health maintenance examination   Primary Care at [facility], [place] A, MD   4 years ago Type 2 diabetes mellitus with other ophthalmic complication, with long-term current use of insulin  [facility])   Primary Care at [facility], Zoe A, MD   5 years ago Type 2 diabetes mellitus with other ophthalmic complication, with long-term current use of insulin  [facility])   Primary Care at [facility], Cathye Coca, MD       Future Appointments             In 1 month Simmons-Robinson, Judyann Number, MD [facility], [facility]

## 2023-10-24 NOTE — Telephone Encounter (Signed)
 Requested medications are due for refill today.  Unsure  Requested medications are on the active medications list.  yes  Last refill. 09/17/2023 #30 0 rf  Future visit scheduled.   yes  Notes to clinic.  Pt had 2 rxs both on 09/17/2023 #30 0 rf. Pt also has another rx that will be starting 11/18/2023 on med list.    Requested Prescriptions  Pending Prescriptions Disp Refills   zolpidem  (AMBIEN ) 10 MG tablet [Pharmacy Med Name: ZOLPIDEM  TARTRATE TABS 10MG ] 30 tablet 0    Sig: TAKE 1 TABLET AT BEDTIME AS NEEDED FOR SLEEP     Not Delegated - Psychiatry:  Anxiolytics/Hypnotics Failed - 10/24/2023 11:23 AM      Failed - This refill cannot be delegated      Failed - Urine Drug Screen completed in last 360 days      Failed - Valid encounter within last 6 months    Recent Outpatient Visits           1 year ago Encounter to establish care   [facility] [name] J, NP   4 years ago Type 2 diabetes mellitus with other ophthalmic complication, with long-term current use of insulin  [facility])   Primary Care at [facility], Cathye Coca, MD   4 years ago Health maintenance examination   Primary Care at [facility], [place] A, MD   4 years ago Type 2 diabetes mellitus with other ophthalmic complication, with long-term current use of insulin  [facility])   Primary Care at [facility], Zoe A, MD   5 years ago Type 2 diabetes mellitus with other ophthalmic complication, with long-term current use of insulin  [facility])   Primary Care at [facility], Cathye Coca, MD       Future Appointments             In 1 month Simmons-Robinson, Judyann Number, MD [facility]            Signed Prescriptions Disp Refills   empagliflozin  (JARDIANCE ) 25 MG TABS tablet 90 tablet 0    Sig: TAKE 1 TABLET DAILY     Endocrinology:  Diabetes - SGLT2 Inhibitors Failed - 10/24/2023 11:23 AM      Failed - Valid encounter within last 6 months    Recent Outpatient  Visits           1 year ago Encounter to establish care   [facility] at [facility], [name] J, NP   4 years ago Type 2 diabetes mellitus with other ophthalmic complication, with long-term current use of insulin  (HCC)   Primary Care at [facility], Cathye Coca, MD   4 years ago Health maintenance examination   Primary Care at [facility], [place] A, MD   4 years ago Type 2 diabetes mellitus with other ophthalmic complication, with long-term current use of insulin  [facility])   Primary Care at [facility], Zoe A, MD   5 years ago Type 2 diabetes mellitus with other ophthalmic complication, with long-term current use of insulin  [facility])   Primary Care at [facility], Cathye Coca, MD       Future Appointments             In 1 month Simmons-Robinson, Makiera, MD [facility]            Passed - Cr in normal range and within 360 days    Creat  Date Value  Ref Range Status  09/14/2022 0.94 0.50 - 1.05 mg/dL Final   Creatinine, Ser  Date Value Ref Range Status  05/28/2023 0.97 0.57 - 1.00 mg/dL Final   Creatinine,U  Date Value Ref Range Status  12/06/2021 69.8 mg/dL Final   Creatinine, Urine  Date Value Ref Range Status  09/14/2022 56 20 - 275 mg/dL Final         Passed - HBA1C is between 0 and 7.9 and within 180 days    Hemoglobin A1C  Date Value Ref Range Status  09/02/2023 6.2 (A) 4.0 - 5.6 % Final   Hgb A1c MFr Bld  Date Value Ref Range Status  09/14/2022 5.9 (H) <5.7 % of total Hgb Final    Comment:    For someone without known diabetes, a hemoglobin  A1c value between 5.7% and 6.4% is consistent with prediabetes and should be confirmed with a  follow-up test. . For someone with known diabetes, a value <7% indicates that their diabetes is well controlled. A1c targets should be individualized based on duration of diabetes, age, comorbid conditions, and other considerations. . This assay result is consistent  with an increased risk of diabetes. . Currently, no consensus exists regarding use of hemoglobin A1c for diagnosis of diabetes for children. .          Passed - eGFR in normal range and within 360 days    GFR, Est African American  Date Value Ref Range Status  09/23/2014 >89 mL/min Final   GFR calc Af Amer  Date Value Ref Range Status  03/31/2019 91 >59 mL/min/1.73 Final   GFR, Est Non African American  Date Value Ref Range Status  09/23/2014 >89 mL/min Final    Comment:      The estimated GFR is a calculation valid for adults (>=49 years old) that uses the CKD-EPI algorithm to adjust for age and sex. It is   not to be used for children, pregnant women, hospitalized patients,    patients on dialysis, or with rapidly changing kidney function. According to the NKDEP, eGFR >89 is normal, 60-89 shows mild impairment, 30-59 shows moderate impairment, 15-29 shows severe impairment and <15 is ESRD.      GFR, Estimated  Date Value Ref Range Status  04/04/2022 >60 >60 mL/min Final    Comment:    (NOTE) Calculated using the CKD-EPI Creatinine Equation (2021)    GFR  Date Value Ref Range Status  02/20/2022 65.76 >60.00 mL/min Final    Comment:    Calculated using the CKD-EPI Creatinine Equation (2021)   eGFR  Date Value Ref Range Status  05/28/2023 65 >59 mL/min/1.73 Final

## 2023-11-25 ENCOUNTER — Other Ambulatory Visit: Payer: Self-pay | Admitting: Family Medicine

## 2023-11-25 ENCOUNTER — Ambulatory Visit: Payer: Self-pay | Admitting: Family Medicine

## 2023-11-25 DIAGNOSIS — F5101 Primary insomnia: Secondary | ICD-10-CM

## 2023-11-25 NOTE — Telephone Encounter (Unsigned)
 Copied from CRM [id number]. Topic: Clinical - Medication Refill >> Nov 25, 2023  7:41 AM Donna BRAVO wrote:  Medication: zolpidem  (AMBIEN ) 10 MG tablet  Has the patient contacted their pharmacy? Yes Told to call provider  This is the patient's preferred pharmacy:   [facility] DELIVERY - [place], [place] - [address] [place] [address] Phone: [phone] Fax: [phone]   Is this the correct pharmacy for this prescription? Yes If no, delete pharmacy and type the correct one.   Has the prescription been filled recently? Yes  Is the patient out of the medication? No  Has the patient been seen for an appointment in the last year OR does the patient have an upcoming appointment? Yes  Can we respond through MyChart? Yes  Agent: Please be advised that Rx refills may take up to 3 business days. We ask that you follow-up with your pharmacy.

## 2023-11-27 MED ORDER — ZOLPIDEM TARTRATE 10 MG PO TABS: 10.0000 mg | ORAL_TABLET | Freq: Every evening | ORAL | 3 refills | Status: DC | PRN

## 2023-11-27 NOTE — Telephone Encounter (Signed)
 Copied from CRM [id number]. Topic: Clinical - Prescription Issue >> Nov 27, 2023  9:12 AM Elle L wrote: Reason for CRM: The patient states that her pharmacy has not received her prescription for zolpidem  (AMBIEN ) 10 MG tablet.   Pharmacy: [facility] DELIVERY - [address] [place] - [address] [place] [address] Phone: [phone]  Fax: [phone]

## 2023-11-27 NOTE — Telephone Encounter (Signed)
 Requested medication (s) are due for refill today: yes  Requested medication (s) are on the active medication list: yes  Last refill:  09/17/23 #30  Future visit scheduled: yes  Notes to clinic:  med not delegated to NT to Reorder   Requested Prescriptions  Pending Prescriptions Disp Refills   zolpidem  (AMBIEN ) 10 MG tablet 30 tablet 0    Sig: Take 1 tablet (10 mg total) by mouth at bedtime as needed. for sleep     Not Delegated - Psychiatry:  Anxiolytics/Hypnotics Failed - 11/27/2023  9:21 AM      Failed - This refill cannot be delegated      Failed - Urine Drug Screen completed in last 360 days      Failed - Valid encounter within last 6 months    Recent Outpatient Visits           1 year ago Encounter to establish care   [facility], [name] J, NP   4 years ago Type 2 diabetes mellitus with other ophthalmic complication, with long-term current use of insulin  [facility])   Primary Care at [facility], Santana LABOR, MD   4 years ago Health maintenance examination   Primary Care at [facility], [place] A, MD   4 years ago Type 2 diabetes mellitus with other ophthalmic complication, with long-term current use of insulin  [facility])   Primary Care at [facility], Zoe A, MD   5 years ago Type 2 diabetes mellitus with other ophthalmic complication, with long-term current use of insulin  [facility])   Primary Care at [facility], Santana LABOR, MD

## 2023-12-02 ENCOUNTER — Encounter: Payer: Self-pay | Admitting: "Endocrinology

## 2023-12-02 ENCOUNTER — Ambulatory Visit (INDEPENDENT_AMBULATORY_CARE_PROVIDER_SITE_OTHER): Admitting: "Endocrinology

## 2023-12-02 VITALS — BP 110/70 | HR 66 | Ht 64.0 in | Wt 166.0 lb

## 2023-12-02 DIAGNOSIS — E7800 Pure hypercholesterolemia, unspecified: Secondary | ICD-10-CM | POA: Diagnosis not present

## 2023-12-02 DIAGNOSIS — Z7985 Long-term (current) use of injectable non-insulin antidiabetic drugs: Secondary | ICD-10-CM | POA: Diagnosis not present

## 2023-12-02 DIAGNOSIS — E1140 Type 2 diabetes mellitus with diabetic neuropathy, unspecified: Secondary | ICD-10-CM | POA: Diagnosis not present

## 2023-12-02 DIAGNOSIS — Z7984 Long term (current) use of oral hypoglycemic drugs: Secondary | ICD-10-CM | POA: Diagnosis not present

## 2023-12-02 LAB — POCT GLYCOSYLATED HEMOGLOBIN (HGB A1C): Hemoglobin A1C: 5.7 % — AB (ref 4.0–5.6)

## 2023-12-02 MED ORDER — EMPAGLIFLOZIN 25 MG PO TABS: 25.0000 mg | ORAL_TABLET | Freq: Every day | ORAL | 2 refills | Status: DC

## 2023-12-02 NOTE — Progress Notes (Signed)
 Outpatient Endocrinology Note Obadiah Birmingham, MD  12/02/23   Heidi Howell 1955-09-11 [id number]  Referring Provider: Sharma Bullocks* Primary Care Provider: Sharma Coyer, MD Reason for consultation: Subjective   Assessment & Plan  Diagnoses and all orders for this visit:  Controlled type 2 diabetes with neuropathy (HCC) -     POCT glycosylated hemoglobin (Hb A1C)  Long term (current) use of oral hypoglycemic drugs  Long-term (current) use of injectable non-insulin  antidiabetic drugs  Pure hypercholesterolemia  Other orders -     empagliflozin  (JARDIANCE ) 25 MG TABS tablet; Take 1 tablet (25 mg total) by mouth daily.   Diabetes complicated by neuropathy, retinopathy Hba1c goal less than 7.0, current Hba1c is 5.7%. Will recommend for the following change of medications to: Metformin  ER 500 mg two pills a day Jardiance  25 mg a day Mounjaro  15 mg weekly     Prandin  2 mg bd before meal ONLY if BG is >200   No known contraindications to any of above medications No history of MEN syndrome/medullary thyroid cancer/pancreatitis or pancreatic cancer in self or family  Hyperlipidemia 05/28/23 Last LDL at goal: 47 -continue crestor  5 mg qd  -Follow low fat diet and exercise   -Blood pressure goal <140/90 - Microalbumin/creatinine at goal < 30 - on ACE/ARB telmisartan  80 mg qd -diet changes including salt restriction -limit eating outside -counseled BP targets per standards of diabetes care -Uncontrolled blood pressure can lead to retinopathy, nephropathy and cardiovascular and atherosclerotic heart disease  Reviewed and counseled on: -A1C target -Blood sugar targets -Complications of uncontrolled diabetes  -Checking blood sugar before meals and bedtime and bring log next visit -All medications with mechanism of action and side effects -Hypoglycemia management: rule of 15's, Glucagon Emergency Kit and medical alert ID -low-carb low-fat  plate-method diet -At least 20 minutes of physical activity per day -Annual dilated retinal eye exam and foot exam -compliance and follow up needs -follow up as scheduled or earlier if problem gets worse  Call if blood sugar is less than 70 or consistently above 250    Take a 15 gm snack of carbohydrate at bedtime before you go to sleep if your blood sugar is less than 100.    If you are going to fast after midnight for a test or procedure, ask your physician for instructions on how to reduce/decrease your insulin  dose.    Call if blood sugar is less than 70 or consistently above 250  -Treating a low sugar by rule of 15  (15 gms of sugar every 15 min until sugar is more than 70) If you feel your sugar is low, test your sugar to be sure If your sugar is low (less than 70), then take 15 grams of a fast acting Carbohydrate (3-4 glucose tablets or glucose gel or 4 ounces of juice or regular soda) Recheck your sugar 15 min after treating low to make sure it is more than 70 If sugar is still less than 70, treat again with 15 grams of carbohydrate          Don't drive the hour of hypoglycemia  If unconscious/unable to eat or drink by mouth, use glucagon injection or nasal spray baqsimi and call 911. Can repeat again in 15 min if still unconscious.  Return in about 4 months (around 04/03/2024).   I have reviewed current medications, nurse's notes, allergies, vital signs, past medical and surgical history, family medical history, and social history for this encounter. Counseled patient on  symptoms, examination findings, lab findings, imaging results, treatment decisions and monitoring and prognosis. The patient understood the recommendations and agrees with the treatment plan. All questions regarding treatment plan were fully answered.  Obadiah Birmingham, MD  12/02/23    History of Present Illness Heidi Howell is a 66 y.o. 68 year old female who presents for evaluation of Type 2 diabetes  mellitus.  Heidi Howell was first diagnosed in 2007.   Diabetes education +  Current regimen:   Prandin  2 mg bd before BF and dinner PRN BG>200 metformin  ER 500 mg two pills a day Jardiance  25 mg a day Mounjaro  15 mg weekly    Previous history: Non-insulin  hypoglycemic drugs previously used: Metformin , Trulicity , Prandin  Rybelsus  was started in 11/20 Insulin  was started in 2014 and more recently was taking Humulin R   COMPLICATIONS -  MI/Stroke -  retinopathy, sees ophthal bi-annually +  neuropathy -  nephropathy  BLOOD SUGAR DATA 73-148 on meter Checks 1-3 times a day before/after  meals  Physical Exam  BP 110/70   Pulse 66   Ht 5' 4 (1.626 m)   Wt 166 lb (75.3 kg)   LMP 01/15/2011   SpO2 99%   BMI 28.49 kg/m    Constitutional: well developed, well nourished Head: normocephalic, atraumatic Eyes: sclera anicteric, no redness Neck: supple Lungs: normal respiratory effort Neurology: alert and oriented Skin: dry, no appreciable rashes Musculoskeletal: no appreciable defects Psychiatric: normal mood and affect Diabetic Foot Exam - Simple   No data filed      Current Medications Patient's Medications  New Prescriptions   No medications on file  Previous Medications   CARVEDILOL  (COREG ) 25 MG TABLET    Take 1 tablet (25 mg total) by mouth 2 (two) times daily with a meal.   DORZOLAMIDE-TIMOLOL (COSOPT) 2-0.5 % OPHTHALMIC SOLUTION    Place 1 drop into both eyes 2 (two) times daily.   GLUCOSE BLOOD (ONETOUCH VERIO) TEST STRIP    1 each by Other route daily. And lancets 1/day   HYDROCHLOROTHIAZIDE  (HYDRODIURIL ) 25 MG TABLET    Take 0.5 tablets (12.5 mg total) by mouth daily.   HYDROCHLOROTHIAZIDE  (HYDRODIURIL ) 25 MG TABLET    Take 1 tablet (25 mg total) by mouth daily.   METFORMIN  (GLUCOPHAGE -XR) 500 MG 24 HR TABLET    Take 2 tablets (1,000 mg total) by mouth daily with breakfast.   MONTELUKAST  (SINGULAIR ) 10 MG TABLET    TAKE 1 TABLET DAILY   MULTIPLE VITAMIN  (MULTIVITAMIN) TABLET    Take 1 tablet by mouth daily.   NEBIVOLOL  (BYSTOLIC ) 5 MG TABLET    Take 1 tablet (5 mg total) by mouth daily for 14 days, THEN 1 tablet (5 mg total) every other day for 7 days.   NETARSUDIL DIMESYLATE 0.02 % SOLN    Apply to eye. Instill 1 drop in both eyes every evening   REPAGLINIDE  (PRANDIN ) 2 MG TABLET    Take 1 tablet as needed when sugars are greater than 200, max dose twice a day   ROSUVASTATIN  (CRESTOR ) 5 MG TABLET    TAKE 1 TABLET DAILY   TELMISARTAN  (MICARDIS ) 80 MG TABLET    TAKE 1 TABLET DAILY   TIRZEPATIDE  (MOUNJARO ) 15 MG/0.5ML PEN    Inject 15 mg into the skin once a week.   ZOLPIDEM  (AMBIEN ) 10 MG TABLET    Take 1 tablet (10 mg total) by mouth at bedtime. for sleep   ZOLPIDEM  (AMBIEN ) 10 MG TABLET    TAKE 1  TABLET AT BEDTIME AS NEEDED FOR SLEEP   ZOLPIDEM  (AMBIEN ) 10 MG TABLET    Take 1 tablet (10 mg total) by mouth at bedtime as needed. for sleep  Modified Medications   Modified Medication Previous Medication   EMPAGLIFLOZIN  (JARDIANCE ) 25 MG TABS TABLET empagliflozin  (JARDIANCE ) 25 MG TABS tablet      Take 1 tablet (25 mg total) by mouth daily.    TAKE 1 TABLET DAILY  Discontinued Medications   No medications on file    Allergies Allergies  Allergen Reactions   Ace Inhibitors Cough    Past Medical History Past Medical History:  Diagnosis Date   Anemia    Diabetes mellitus without complication (HCC)    Family history of malignant neoplasm of digestive organs 02/09/2022   Fibroids    Glaucoma    H/O: obesity    Hypertension    Varicose vein     Past Surgical History Past Surgical History:  Procedure Laterality Date   BARTHOLIN GLAND CYST EXCISION  2010   BREAST REDUCTION SURGERY Bilateral 04/10/2022   Procedure: MAMMARY REDUCTION  (BREAST);  Surgeon: Arelia Filippo, MD;  Location: [facility];  Service: Plastics;  Laterality: Bilateral;   CESAREAN SECTION  8009,8003   DILATION AND CURETTAGE OF UTERUS  1989   TUBAL  LIGATION  1996   bilateral    Family History family history includes Cancer in her father and mother; Diabetes in her brother, maternal grandfather, and maternal grandmother; Heart disease in her brother and sister; Mental illness in her sister; Stroke in her paternal aunt and sister.  Social History Social History   Socioeconomic History   Marital status: Married    Spouse name: Elsie Lance   Number of children: Not on file   Years of education: Not on file   Highest education level: Associate degree: occupational, Scientist, product/process development, or vocational program  Occupational History   Not on file  Tobacco Use   Smoking status: Never    Passive exposure: Never   Smokeless tobacco: Never  Vaping Use   Vaping status: Never Used  Substance and Sexual Activity   Alcohol use: No   Drug use: No   Sexual activity: Yes    Partners: Male    Birth control/protection: Post-menopausal    Comment: 1st intercourse 68 yo-5 partners  Other Topics Concern   Not on file  Social History Narrative   Exercise walk 2-3 times for 30-40 minutes   Social Drivers of Health   Financial Resource Strain: Medium Risk (05/24/2023)   Overall Financial Resource Strain (CARDIA)    Difficulty of Paying Living Expenses: Somewhat hard  Food Insecurity: Patient Declined (05/24/2023)   Hunger Vital Sign    Worried About Running Out of Food in the Last Year: Patient declined    Ran Out of Food in the Last Year: Patient declined  Transportation Needs: No Transportation Needs (05/24/2023)   PRAPARE - Administrator, Civil Service (Medical): No    Lack of Transportation (Non-Medical): No  Physical Activity: Insufficiently Active (05/24/2023)   Exercise Vital Sign    Days of Exercise per Week: 2 days    Minutes of Exercise per Session: 20 min  Stress: No Stress Concern Present (05/24/2023)   [employer] of Occupational Health - Occupational Stress Questionnaire    Feeling of Stress : Only a little   Social Connections: Unknown (05/24/2023)   Social Connection and Isolation Panel    Frequency of Communication with Friends and Family:  More than three times a week    Frequency of Social Gatherings with Friends and Family: Patient declined    Attends Religious Services: Patient declined    Active Member of Clubs or Organizations: No    Attends Engineer, structural: Not on file    Marital Status: Married  Intimate Partner Violence: Not on file    Lab Results  Component Value Date   HGBA1C 5.7 (A) 12/02/2023   HGBA1C 6.2 (A) 09/02/2023   HGBA1C 6.0 (A) 04/29/2023   Lab Results  Component Value Date   CHOL 120 05/28/2023   Lab Results  Component Value Date   HDL 57 05/28/2023   Lab Results  Component Value Date   LDLCALC 47 05/28/2023   Lab Results  Component Value Date   TRIG 81 05/28/2023   Lab Results  Component Value Date   CHOLHDL 2.1 05/28/2023   Lab Results  Component Value Date   CREATININE 0.97 05/28/2023   Lab Results  Component Value Date   GFR 65.76 02/20/2022   Lab Results  Component Value Date   MICROALBUR <0.2 09/14/2022      Component Value Date/Time   NA 143 05/28/2023 1054   K 4.0 05/28/2023 1054   CL 103 05/28/2023 1054   CO2 25 05/28/2023 1054   GLUCOSE 103 (H) 05/28/2023 1054   GLUCOSE 87 09/14/2022 1548   BUN 14 05/28/2023 1054   CREATININE 0.97 05/28/2023 1054   CREATININE 0.94 09/14/2022 1548   CALCIUM  9.8 05/28/2023 1054   PROT 7.0 05/28/2023 1054   ALBUMIN 4.5 05/28/2023 1054   AST 22 05/28/2023 1054   ALT 23 05/28/2023 1054   ALKPHOS 92 05/28/2023 1054   BILITOT 0.4 05/28/2023 1054   GFRNONAA >60 04/04/2022 1326   GFRNONAA >89 09/23/2014 1226   GFRAA 91 03/31/2019 0901   GFRAA >89 09/23/2014 1226      Latest Ref Rng & Units 05/28/2023   10:54 AM 09/14/2022    3:48 PM 05/02/2022   10:29 AM  BMP  Glucose 70 - 99 mg/dL 896  87  80   BUN 8 - 27 mg/dL 14  18  9    Creatinine 0.57 - 1.00 mg/dL 9.02  9.05  9.24    BUN/Creat Ratio 12 - 28 14  SEE NOTE:  12   Sodium 134 - 144 mmol/L 143  140  142   Potassium 3.5 - 5.2 mmol/L 4.0  3.6  3.9   Chloride 96 - 106 mmol/L 103  105  104   CO2 20 - 29 mmol/L 25  28  24    Calcium  8.7 - 10.3 mg/dL 9.8  9.6  9.8        Component Value Date/Time   WBC 6.1 05/02/2022 1029   WBC 6.6 04/04/2022 1326   RBC 4.30 05/02/2022 1029   RBC 4.47 04/04/2022 1326   HGB 11.4 05/02/2022 1029   HCT 35.2 05/02/2022 1029   PLT 301 05/02/2022 1029   MCV 82 05/02/2022 1029   MCH 26.5 (L) 05/02/2022 1029   MCH 27.7 04/04/2022 1326   MCHC 32.4 05/02/2022 1029   MCHC 33.1 04/04/2022 1326   RDW 12.3 05/02/2022 1029   LYMPHSABS 2.6 05/02/2022 1029   MONOABS 0.6 04/04/2022 1326   EOSABS 0.3 05/02/2022 1029   BASOSABS 0.1 05/02/2022 1029     Parts of this note may have been dictated using voice recognition software. There may be variances in spelling and vocabulary which are unintentional. Not all  errors are proofread. Please notify the dino if any discrepancies are noted or if the meaning of any statement is not clear.

## 2023-12-13 ENCOUNTER — Other Ambulatory Visit: Payer: Self-pay | Admitting: "Endocrinology

## 2023-12-13 DIAGNOSIS — E1165 Type 2 diabetes mellitus with hyperglycemia: Secondary | ICD-10-CM

## 2023-12-16 ENCOUNTER — Other Ambulatory Visit: Payer: Self-pay | Admitting: Family Medicine

## 2023-12-16 DIAGNOSIS — I10 Essential (primary) hypertension: Secondary | ICD-10-CM

## 2023-12-17 ENCOUNTER — Other Ambulatory Visit: Payer: Self-pay | Admitting: Family Medicine

## 2023-12-17 ENCOUNTER — Telehealth: Payer: Self-pay

## 2023-12-17 ENCOUNTER — Other Ambulatory Visit: Payer: Self-pay

## 2023-12-17 DIAGNOSIS — I10 Essential (primary) hypertension: Secondary | ICD-10-CM

## 2023-12-17 MED ORDER — CARVEDILOL 25 MG PO TABS: 25.0000 mg | ORAL_TABLET | Freq: Two times a day (BID) | ORAL | 3 refills | Status: DC

## 2023-12-17 NOTE — Telephone Encounter (Signed)
 LOV- 05/28/2023 NOV- 01/09/2024 LRF- Carvedilol  25 mg, 12/12/2022 90 x 3

## 2023-12-17 NOTE — Telephone Encounter (Signed)
 LOV- 05/28/2023 NOV- 01/09/2024 LRF- 12/12/2022 90 x 3

## 2023-12-17 NOTE — Telephone Encounter (Signed)
 Rx ordered in separate encounter

## 2023-12-18 NOTE — Telephone Encounter (Deleted)
 Provider already sent in another encounter.

## 2024-01-09 ENCOUNTER — Encounter: Payer: Self-pay | Admitting: Family Medicine

## 2024-01-09 ENCOUNTER — Ambulatory Visit (INDEPENDENT_AMBULATORY_CARE_PROVIDER_SITE_OTHER): Admitting: Family Medicine

## 2024-01-09 VITALS — BP 93/60 | HR 74 | Ht 64.0 in | Wt 162.6 lb

## 2024-01-09 DIAGNOSIS — F5101 Primary insomnia: Secondary | ICD-10-CM

## 2024-01-09 DIAGNOSIS — Z23 Encounter for immunization: Secondary | ICD-10-CM

## 2024-01-09 DIAGNOSIS — I10 Essential (primary) hypertension: Secondary | ICD-10-CM | POA: Diagnosis not present

## 2024-01-09 DIAGNOSIS — E119 Type 2 diabetes mellitus without complications: Secondary | ICD-10-CM | POA: Diagnosis not present

## 2024-01-09 NOTE — Progress Notes (Signed)
 Established patient visit   Patient: Heidi Howell   DOB: 20-Nov-1955   68 y.o. Female  MRN: [id number] Visit Date: 01/09/2024  Today's healthcare provider: Rockie Agent, MD   Chief Complaint  Patient presents with   Medical Management of Chronic Issues    6 months follow-up HTN,insomnia Eye exam scheduled for next month.   Subjective     HPI     Medical Management of Chronic Issues    Additional comments: 6 months follow-up HTN,insomnia Eye exam scheduled for next month.      Last edited by Rosas, Joseline E, CMA on 01/09/2024  9:09 AM.       Discussed the use of AI scribe software for clinical note transcription with the patient, who gave verbal consent to proceed.  History of Present Illness Heidi Howell is a 68 year old female who presents for follow-up regarding insomnia.  She is currently managing her insomnia with Ambien  10 mg at bedtime, which she continues to use effectively.  She has chronic hypertension, managed with carvedilol  25 mg twice daily, Micardis  80 mg daily, and hydrochlorothiazide  12.5 mg daily. She notes her blood pressure is lower than usual, with occasional dizziness upon standing quickly.  Her type 2 diabetes is controlled with Mounjaro  15 mg weekly, Jardiance  25 mg daily, and metformin  1000 mg daily. Her last A1c was 5.7 two months ago. She has lost four pounds since July 28, now weighing 162 pounds.  She is on Crestor  5 mg daily for chronic hyperlipidemia, with her lipid panel within normal range.  She experiences eye irritation and allergies, for which she takes Singulair  10 mg daily. No swelling in her feet or legs.  She experiences occasional leg pain, described as sciatica, starting in the buttock and sometimes radiating down the leg.  She plans to get her last hepatitis shot this month.     Past Medical History:  Diagnosis Date   Allergy    Anemia    Diabetes mellitus without complication (HCC)    Family  history of malignant neoplasm of digestive organs 02/09/2022   Fibroids    GERD (gastroesophageal reflux disease)    Glaucoma    H/O: obesity    Hypertension    Varicose vein     Medications: Outpatient Medications Prior to Visit  Medication Sig   carvedilol  (COREG ) 25 MG tablet Take 1 tablet (25 mg total) by mouth 2 (two) times daily with a meal.   dorzolamide-timolol (COSOPT) 2-0.5 % ophthalmic solution Place 1 drop into both eyes 2 (two) times daily.   empagliflozin  (JARDIANCE ) 25 MG TABS tablet Take 1 tablet (25 mg total) by mouth daily.   hydrochlorothiazide  (HYDRODIURIL ) 25 MG tablet Take 0.5 tablets (12.5 mg total) by mouth daily.   hydrochlorothiazide  (HYDRODIURIL ) 25 MG tablet Take 1 tablet (25 mg total) by mouth daily.   latanoprost (XALATAN) 0.005 % ophthalmic solution SMARTSIG:In Eye(s)   metFORMIN  (GLUCOPHAGE -XR) 500 MG 24 hr tablet Take 2 tablets (1,000 mg total) by mouth daily with breakfast.   montelukast  (SINGULAIR ) 10 MG tablet TAKE 1 TABLET DAILY   Multiple Vitamin (MULTIVITAMIN) tablet Take 1 tablet by mouth daily.   Netarsudil Dimesylate 0.02 % SOLN Apply to eye. Instill 1 drop in both eyes every evening   ONETOUCH VERIO test strip USE 1 STRIP DAILY   repaglinide  (PRANDIN ) 2 MG tablet Take 1 tablet as needed when sugars are greater than 200, max dose twice a day   rosuvastatin  (CRESTOR )  5 MG tablet TAKE 1 TABLET DAILY   telmisartan  (MICARDIS ) 80 MG tablet TAKE 1 TABLET DAILY   tirzepatide  (MOUNJARO ) 15 MG/0.5ML Pen Inject 15 mg into the skin once a week.   zolpidem  (AMBIEN ) 10 MG tablet Take 1 tablet (10 mg total) by mouth at bedtime. for sleep   zolpidem  (AMBIEN ) 10 MG tablet TAKE 1 TABLET AT BEDTIME AS NEEDED FOR SLEEP   zolpidem  (AMBIEN ) 10 MG tablet Take 1 tablet (10 mg total) by mouth at bedtime as needed. for sleep   No facility-administered medications prior to visit.    Review of Systems  Last CBC Lab Results  Component Value Date   WBC 6.1  05/02/2022   HGB 11.4 05/02/2022   HCT 35.2 05/02/2022   MCV 82 05/02/2022   MCH 26.5 (L) 05/02/2022   RDW 12.3 05/02/2022   PLT 301 05/02/2022   Last metabolic panel Lab Results  Component Value Date   GLUCOSE 103 (H) 05/28/2023   NA 143 05/28/2023   K 4.0 05/28/2023   CL 103 05/28/2023   CO2 25 05/28/2023   BUN 14 05/28/2023   CREATININE 0.97 05/28/2023   EGFR 65 05/28/2023   CALCIUM  9.8 05/28/2023   PROT 7.0 05/28/2023   ALBUMIN 4.5 05/28/2023   LABGLOB 2.5 05/28/2023   AGRATIO 1.6 05/02/2022   BILITOT 0.4 05/28/2023   ALKPHOS 92 05/28/2023   AST 22 05/28/2023   ALT 23 05/28/2023   ANIONGAP 6 04/04/2022   Last lipids Lab Results  Component Value Date   CHOL 120 05/28/2023   HDL 57 05/28/2023   LDLCALC 47 05/28/2023   TRIG 81 05/28/2023   CHOLHDL 2.1 05/28/2023   Last hemoglobin A1c Lab Results  Component Value Date   HGBA1C 5.7 (A) 12/02/2023   Last thyroid functions Lab Results  Component Value Date   TSH 1.170 11/20/2016        Objective    BP 93/60 (BP Location: Left Arm, Patient Position: Sitting, Cuff Size: Normal)   Pulse 74   Ht 5' 4 (1.626 m)   Wt 162 lb 9.6 oz (73.8 kg)   LMP 01/15/2011   SpO2 99%   BMI 27.91 kg/m   BP Readings from Last 3 Encounters:  01/09/24 93/60  12/02/23 110/70  09/02/23 106/70   Wt Readings from Last 3 Encounters:  01/09/24 162 lb 9.6 oz (73.8 kg)  12/02/23 166 lb (75.3 kg)  09/02/23 166 lb (75.3 kg)        Physical Exam Vitals reviewed.  Constitutional:      General: She is not in acute distress.    Appearance: Normal appearance. She is not ill-appearing.  Cardiovascular:     Rate and Rhythm: Normal rate and regular rhythm.     Pulses:          Dorsalis pedis pulses are 2+ on the right side and 2+ on the left side.       Posterior tibial pulses are 2+ on the right side and 2+ on the left side.  Pulmonary:     Effort: Pulmonary effort is normal. No respiratory distress.     Breath sounds: No  wheezing, rhonchi or rales.  Musculoskeletal:     Right foot: Normal range of motion. No deformity or bunion.     Left foot: Normal range of motion. No deformity or bunion.  Feet:     Right foot:     Protective Sensation: 6 sites tested.  6 sites sensed.  Skin integrity: Skin integrity normal. No erythema.     Toenail Condition: Right toenails are normal.     Left foot:     Protective Sensation: 6 sites tested.  6 sites sensed.     Skin integrity: Skin integrity normal. No erythema.     Toenail Condition: Left toenails are normal.     Comments: Bilateral hallux valgus  Neurological:     Mental Status: She is alert and oriented to person, place, and time.  Psychiatric:        Mood and Affect: Mood normal.        Behavior: Behavior normal.       No results found for any visits on 01/09/24.  Assessment & Plan     Problem List Items Addressed This Visit   None Visit Diagnoses       Immunization due    -  Primary   Relevant Orders   Flu vaccine HIGH DOSE PF(Fluzone Trivalent)       Assessment and Plan Assessment & Plan Primary insomnia Chronic insomnia managed with Ambien  10 mg at bedtime. She reports doing well on the current regimen. - Continue Ambien  10 mg at bedtime  Type 2 diabetes mellitus Chronic type 2 diabetes mellitus well controlled with Mounjaro  15 mg weekly. Last A1c was 5.7, indicating good control. Discussion about potentially reducing diabetes medications if A1c continues to improve. - Continue Mounjaro  15 mg weekly - Follow up with endocrinology in December  Essential hypertension and iatrogenic hypotension Blood pressure recorded at 93/60, lower than usual. She reports occasional dizziness when standing quickly. Currently on carvedilol , Micardis , and hydrochlorothiazide . Plan to discontinue hydrochlorothiazide  to assess if blood pressure can be maintained with carvedilol  and Micardis  alone. - Discontinue hydrochlorothiazide  - Monitor blood pressure  regularly - Report any headaches or dizziness  Hyperlipidemia Chronic hyperlipidemia managed with Crestor  5 mg daily. Lipid panel is well controlled and within normal range. - Continue Crestor  5 mg daily  Allergic rhinitis Allergic rhinitis managed with Singulair  10 mg daily. She reports eye irritation and allergies. - Continue Singulair  10 mg daily  Chronic low back pain with sciatica Intermittent sciatica pain reported, starting in the buttock and radiating down the leg. No major concerns reported. - Advise Epsom salt soaks and heat therapy - Encourage exercise focusing on strengthening quads and hamstrings     No follow-ups on file.         Rockie Agent, MD  [facility] [phone] (phone) [phone] (fax)  [facility]

## 2024-01-29 ENCOUNTER — Other Ambulatory Visit: Payer: Self-pay | Admitting: Family Medicine

## 2024-01-29 ENCOUNTER — Encounter: Payer: Self-pay | Admitting: Family Medicine

## 2024-01-29 DIAGNOSIS — I10 Essential (primary) hypertension: Secondary | ICD-10-CM

## 2024-02-05 ENCOUNTER — Encounter: Payer: Self-pay | Admitting: Family Medicine

## 2024-02-06 ENCOUNTER — Other Ambulatory Visit: Payer: Self-pay

## 2024-02-06 DIAGNOSIS — E119 Type 2 diabetes mellitus without complications: Secondary | ICD-10-CM

## 2024-02-06 MED ORDER — TRUE METRIX METER W/DEVICE KIT: 1.0000 | PACK | Freq: Every day | 0 refills | Status: DC

## 2024-02-07 NOTE — Telephone Encounter (Signed)
 Pt is needing lancets and test strips to go with this new blood glucose machine. Please call this in to her pharmacy. Any questions please advise.  Blood Glucose Monitoring Suppl (TRUE METRIX METER) w/Device KIT

## 2024-02-13 ENCOUNTER — Other Ambulatory Visit: Payer: Self-pay

## 2024-02-13 DIAGNOSIS — E119 Type 2 diabetes mellitus without complications: Secondary | ICD-10-CM

## 2024-02-13 MED ORDER — BLOOD GLUCOSE TEST VI STRP: 1.0000 | ORAL_STRIP | Freq: Three times a day (TID) | 0 refills | Status: AC

## 2024-02-13 MED ORDER — LANCETS MISC. MISC: 1.0000 | Freq: Three times a day (TID) | 0 refills | Status: AC

## 2024-02-13 MED ORDER — BLOOD GLUCOSE MONITORING SUPPL DEVI: 1.0000 | Freq: Three times a day (TID) | 0 refills | Status: AC

## 2024-02-13 MED ORDER — LANCET DEVICE MISC: 1.0000 | Freq: Three times a day (TID) | 0 refills | Status: AC

## 2024-02-18 ENCOUNTER — Other Ambulatory Visit: Payer: Self-pay

## 2024-02-18 DIAGNOSIS — E1165 Type 2 diabetes mellitus with hyperglycemia: Secondary | ICD-10-CM

## 2024-02-18 MED ORDER — FREESTYLE FREEDOM LITE W/DEVICE KIT: PACK | 0 refills | Status: DC

## 2024-02-18 MED ORDER — FREESTYLE LITE TEST VI STRP: ORAL_STRIP | 5 refills | Status: DC

## 2024-02-18 MED ORDER — FREESTYLE LANCETS MISC: 5 refills | Status: DC

## 2024-02-20 ENCOUNTER — Telehealth: Payer: Self-pay | Admitting: "Endocrinology

## 2024-02-20 ENCOUNTER — Other Ambulatory Visit: Payer: Self-pay

## 2024-02-20 DIAGNOSIS — E1165 Type 2 diabetes mellitus with hyperglycemia: Secondary | ICD-10-CM

## 2024-02-20 MED ORDER — FREESTYLE LITE TEST VI STRP: ORAL_STRIP | 5 refills | Status: AC

## 2024-02-20 MED ORDER — FREESTYLE LANCETS MISC: 5 refills | Status: AC

## 2024-02-20 MED ORDER — FREESTYLE FREEDOM LITE W/DEVICE KIT: PACK | 0 refills | Status: AC

## 2024-02-20 NOTE — Telephone Encounter (Signed)
 Patient came into the office this morning 02/20/24 inquiring about a new RX for a Freestyle Libre meter and test strips.Her current pharmacy isn't covering her current RX.Please advise

## 2024-02-21 ENCOUNTER — Other Ambulatory Visit: Payer: Self-pay | Admitting: Family Medicine

## 2024-02-24 ENCOUNTER — Other Ambulatory Visit: Payer: Self-pay | Admitting: Family Medicine

## 2024-02-24 NOTE — Telephone Encounter (Unsigned)
 Copied from CRM #[id number]. Topic: Clinical - Medication Refill >> Feb 24, 2024  8:41 AM Avram MATSU wrote: Medication: montelukast  (SINGULAIR ) 10 MG tablet [[id number]]  Has the patient contacted their pharmacy? Yes (Agent: If no, request that the patient contact the pharmacy for the refill. If patient does not wish to contact the pharmacy document the reason why and proceed with request.) (Agent: If yes, when and what did the pharmacy advise?)  This is the patient's preferred pharmacy:  [facility] - [place], [place] - [address] [place] [address] Phone: [phone] Fax: [phone]  Is this the correct pharmacy for this prescription? Yes If no, delete pharmacy and type the correct one.   Has the prescription been filled recently? No  Is the patient out of the medication? No  Has the patient been seen for an appointment in the last year OR does the patient have an upcoming appointment? Yes  Can we respond through MyChart? Yes  Agent: Please be advised that Rx refills may take up to 3 business days. We ask that you follow-up with your pharmacy.

## 2024-02-25 NOTE — Telephone Encounter (Signed)
 Refilled in a separate encounter on 02/24/24.

## 2024-03-03 ENCOUNTER — Other Ambulatory Visit: Payer: Self-pay | Admitting: "Endocrinology

## 2024-03-03 DIAGNOSIS — E1165 Type 2 diabetes mellitus with hyperglycemia: Secondary | ICD-10-CM

## 2024-03-23 ENCOUNTER — Other Ambulatory Visit: Payer: Self-pay

## 2024-03-23 DIAGNOSIS — E1165 Type 2 diabetes mellitus with hyperglycemia: Secondary | ICD-10-CM

## 2024-03-26 ENCOUNTER — Other Ambulatory Visit: Payer: Self-pay | Admitting: Family Medicine

## 2024-03-26 DIAGNOSIS — F5101 Primary insomnia: Secondary | ICD-10-CM

## 2024-04-06 ENCOUNTER — Encounter: Payer: Self-pay | Admitting: "Endocrinology

## 2024-04-06 ENCOUNTER — Ambulatory Visit (INDEPENDENT_AMBULATORY_CARE_PROVIDER_SITE_OTHER): Admitting: "Endocrinology

## 2024-04-06 VITALS — BP 122/70 | HR 72 | Ht 64.0 in | Wt 165.0 lb

## 2024-04-06 DIAGNOSIS — Z7984 Long term (current) use of oral hypoglycemic drugs: Secondary | ICD-10-CM

## 2024-04-06 DIAGNOSIS — E11649 Type 2 diabetes mellitus with hypoglycemia without coma: Secondary | ICD-10-CM

## 2024-04-06 DIAGNOSIS — Z7985 Long-term (current) use of injectable non-insulin antidiabetic drugs: Secondary | ICD-10-CM

## 2024-04-06 DIAGNOSIS — E7800 Pure hypercholesterolemia, unspecified: Secondary | ICD-10-CM | POA: Diagnosis not present

## 2024-04-06 LAB — POCT GLYCOSYLATED HEMOGLOBIN (HGB A1C): Hemoglobin A1C: 5.4 % (ref 4.0–5.6)

## 2024-04-06 MED ORDER — TIRZEPATIDE 15 MG/0.5ML ~~LOC~~ SOAJ: 15.0000 mg | SUBCUTANEOUS | 3 refills | Status: DC

## 2024-04-06 MED ORDER — METFORMIN HCL ER 500 MG PO TB24: 1000.0000 mg | ORAL_TABLET | Freq: Every day | ORAL | 3 refills | Status: AC

## 2024-04-06 NOTE — Patient Instructions (Signed)

## 2024-04-06 NOTE — Progress Notes (Signed)
 Outpatient Endocrinology Note Obadiah Birmingham, MD  04/06/24   Heidi Howell October 29, 1955 [id number]  Referring Provider: Sharma Bullocks* Primary Care Provider: Sharma Coyer, MD Reason for consultation: Subjective   Assessment & Plan  Diagnoses and all orders for this visit:  Uncontrolled type 2 diabetes mellitus with hypoglycemia without coma (HCC) -     POCT glycosylated hemoglobin (Hb A1C) -     metFORMIN  (GLUCOPHAGE -XR) 500 MG 24 hr tablet; Take 2 tablets (1,000 mg total) by mouth daily with breakfast. -     tirzepatide  (MOUNJARO ) 15 MG/0.5ML Pen; Inject 15 mg into the skin once a week.  Long term (current) use of oral hypoglycemic drugs  Long-term (current) use of injectable non-insulin  antidiabetic drugs  Pure hypercholesterolemia   Diabetes complicated by neuropathy, retinopathy Hba1c goal less than 7.0, current Hba1c is Lab Results  Component Value Date   HGBA1C 5.4 04/06/2024   HGBA1C 5.7 (A) 12/02/2023   HGBA1C 6.2 (A) 09/02/2023    Will recommend for the following change of medications to: Metformin  ER 500 mg two pills a day Mounjaro  15 mg weekly (patient would like to continue current dose despite decreased appetite)      04/06/24: Stop Jardiance  25 mg a day given lwo BG in 50s Prandin  2 mg bd before meal ONLY if BG is >200   No known contraindications to any of above medications No history of MEN syndrome/medullary thyroid cancer/pancreatitis or pancreatic cancer in self or family  Hyperlipidemia 05/28/23 Last LDL at goal: 47 -continue rosuvastatin  5 mg qd  -Follow low fat diet and exercise   -Blood pressure goal <140/90 - Microalbumin/creatinine at goal < 30 - on ACE/ARB telmisartan  80 mg qd -diet changes including salt restriction -limit eating outside -counseled BP targets per standards of diabetes care -Uncontrolled blood pressure can lead to retinopathy, nephropathy and cardiovascular and atherosclerotic heart  disease  Reviewed and counseled on: -A1C target -Blood sugar targets -Complications of uncontrolled diabetes  -Checking blood sugar before meals and bedtime and bring log next visit -All medications with mechanism of action and side effects -Hypoglycemia management: rule of 15's, Glucagon Emergency Kit and medical alert ID -low-carb low-fat plate-method diet -At least 20 minutes of physical activity per day -Annual dilated retinal eye exam and foot exam -compliance and follow up needs -follow up as scheduled or earlier if problem gets worse  Call if blood sugar is less than 70 or consistently above 250    Take a 15 gm snack of carbohydrate at bedtime before you go to sleep if your blood sugar is less than 100.    If you are going to fast after midnight for a test or procedure, ask your physician for instructions on how to reduce/decrease your insulin  dose.    Call if blood sugar is less than 70 or consistently above 250  -Treating a low sugar by rule of 15  (15 gms of sugar every 15 min until sugar is more than 70) If you feel your sugar is low, test your sugar to be sure If your sugar is low (less than 70), then take 15 grams of a fast acting Carbohydrate (3-4 glucose tablets or glucose gel or 4 ounces of juice or regular soda) Recheck your sugar 15 min after treating low to make sure it is more than 70 If sugar is still less than 70, treat again with 15 grams of carbohydrate          Don't drive the hour of hypoglycemia  If unconscious/unable to eat or drink by mouth, use glucagon injection or nasal spray baqsimi and call 911. Can repeat again in 15 min if still unconscious.  Return in about 4 months (around 08/05/2024).   I have reviewed current medications, nurse's notes, allergies, vital signs, past medical and surgical history, family medical history, and social history for this encounter. Counseled patient on symptoms, examination findings, lab findings, imaging results,  treatment decisions and monitoring and prognosis. The patient understood the recommendations and agrees with the treatment plan. All questions regarding treatment plan were fully answered.  Obadiah Birmingham, MD  04/06/24    History of Present Illness Heidi Howell is a 68 y.o. year old female who presents for follow up of Type 2 diabetes mellitus.  Heidi Howell was first diagnosed in 2007.   Diabetes education +  Current regimen:   Prandin  2 mg bd before BF and dinner PRN BG>200 metformin  ER 500 mg two pills a day Jardiance  25 mg a day Mounjaro  15 mg weekly    Previous history: Non-insulin  hypoglycemic drugs previously used: Metformin , Trulicity , Prandin  Rybelsus  was started in 11/20 Insulin  was started in 2014 and more recently was taking Humulin R   COMPLICATIONS -  MI/Stroke -  retinopathy, sees ophthal bi-annually -  neuropathy -  nephropathy  BLOOD SUGAR DATA Range: 54-151 on meter Checks 1-3 times a day before/after meals  Physical Exam  BP 122/70   Pulse 72   Ht 5' 4 (1.626 m)   Wt 165 lb (74.8 kg)   LMP 01/15/2011   SpO2 98%   BMI 28.32 kg/m    Constitutional: well developed, well nourished Head: normocephalic, atraumatic Eyes: sclera anicteric, no redness Neck: supple Lungs: normal respiratory effort Neurology: alert and oriented Skin: dry, no appreciable rashes Musculoskeletal: no appreciable defects Psychiatric: normal mood and affect Diabetic Foot Exam - Simple   No data filed      Current Medications Patient's Medications  New Prescriptions   No medications on file  Previous Medications   BLOOD GLUCOSE MONITORING SUPPL (FREESTYLE FREEDOM LITE) W/DEVICE KIT    Use to check blood sugar 4 times a day.   BLOOD GLUCOSE MONITORING SUPPL DEVI    1 each by Does not apply route in the morning, at noon, and at bedtime. May substitute to any manufacturer covered by patient's insurance.   CARVEDILOL  (COREG ) 25 MG TABLET    Take 1 tablet (25 mg total)  by mouth 2 (two) times daily with a meal.   DORZOLAMIDE-TIMOLOL (COSOPT) 2-0.5 % OPHTHALMIC SOLUTION    Place 1 drop into both eyes 2 (two) times daily.   GLUCOSE BLOOD (FREESTYLE LITE) TEST STRIP    Use to check blood sugar 2 times a day   LANCETS (FREESTYLE) LANCETS    Use to check blood sugar 2 times a day   LATANOPROST (XALATAN) 0.005 % OPHTHALMIC SOLUTION    SMARTSIG:In Eye(s)   MONTELUKAST  (SINGULAIR ) 10 MG TABLET    TAKE 1 TABLET DAILY   MULTIPLE VITAMIN (MULTIVITAMIN) TABLET    Take 1 tablet by mouth daily.   NETARSUDIL DIMESYLATE 0.02 % SOLN    Apply to eye. Instill 1 drop in both eyes every evening   ONETOUCH VERIO TEST STRIP    USE 1 STRIP DAILY   ROSUVASTATIN  (CRESTOR ) 5 MG TABLET    TAKE 1 TABLET DAILY   TELMISARTAN  (MICARDIS ) 80 MG TABLET    TAKE 1 TABLET DAILY   ZOLPIDEM  (AMBIEN ) 10 MG TABLET  Take 1 tablet (10 mg total) by mouth at bedtime. for sleep   ZOLPIDEM  (AMBIEN ) 10 MG TABLET    TAKE 1 TABLET AT BEDTIME AS NEEDED FOR SLEEP   ZOLPIDEM  (AMBIEN ) 10 MG TABLET    TAKE 1 TABLET AT BEDTIME AS NEEDED FOR SLEEP  Modified Medications   Modified Medication Previous Medication   METFORMIN  (GLUCOPHAGE -XR) 500 MG 24 HR TABLET metFORMIN  (GLUCOPHAGE -XR) 500 MG 24 hr tablet      Take 2 tablets (1,000 mg total) by mouth daily with breakfast.    Take 2 tablets (1,000 mg total) by mouth daily with breakfast.   TIRZEPATIDE  (MOUNJARO ) 15 MG/0.5ML PEN tirzepatide  (MOUNJARO ) 15 MG/0.5ML Pen      Inject 15 mg into the skin once a week.    Inject 15 mg into the skin once a week.  Discontinued Medications   EMPAGLIFLOZIN  (JARDIANCE ) 25 MG TABS TABLET    Take 1 tablet (25 mg total) by mouth daily.   REPAGLINIDE  (PRANDIN ) 2 MG TABLET    TAKE 1 TABLET AS NEEDED WHEN SUGARS ARE GREATER THAN 200. MAXIMUM DOSE 1 TABLET TWICE A DAY.    Allergies Allergies  Allergen Reactions   Ace Inhibitors Cough    Past Medical History Past Medical History:  Diagnosis Date   Allergy    Anemia    Diabetes  mellitus without complication (HCC)    Family history of malignant neoplasm of digestive organs 02/09/2022   Fibroids    GERD (gastroesophageal reflux disease)    Glaucoma    H/O: obesity    Hypertension    Varicose vein     Past Surgical History Past Surgical History:  Procedure Laterality Date   BARTHOLIN GLAND CYST EXCISION  05/07/2008   BREAST REDUCTION SURGERY Bilateral 04/10/2022   Procedure: MAMMARY REDUCTION  (BREAST);  Surgeon: Arelia Filippo, MD;  Location: [facility];  Service: Plastics;  Laterality: Bilateral;   BREAST SURGERY  12/23   CESAREAN SECTION  8009,8003   DILATION AND CURETTAGE OF UTERUS  05/08/1987   EYE SURGERY     TUBAL LIGATION  05/07/1994   bilateral    Family History family history includes Cancer in her father and mother; Diabetes in her brother, maternal grandfather, and maternal grandmother; Heart disease in her brother and sister; Mental illness in her sister; Stroke in her paternal aunt and sister.  Social History Social History   Socioeconomic History   Marital status: Married    Spouse name: Elsie Lance   Number of children: Not on file   Years of education: Not on file   Highest education level: Associate degree: occupational, scientist, product/process development, or vocational program  Occupational History   Not on file  Tobacco Use   Smoking status: Never    Passive exposure: Never   Smokeless tobacco: Never  Vaping Use   Vaping status: Never Used  Substance and Sexual Activity   Alcohol use: No   Drug use: No   Sexual activity: Yes    Partners: Male    Birth control/protection: Post-menopausal    Comment: 1st intercourse 75 yo-5 partners  Other Topics Concern   Not on file  Social History Narrative   Exercise walk 2-3 times for 30-40 minutes   Social Drivers of Health   Financial Resource Strain: Patient Declined (01/07/2024)   Overall Financial Resource Strain (CARDIA)    Difficulty of Paying Living Expenses: Patient declined   Food Insecurity: Patient Declined (01/07/2024)   Hunger Vital Sign    Worried  About Running Out of Food in the Last Year: Patient declined    Ran Out of Food in the Last Year: Patient declined  Transportation Needs: No Transportation Needs (01/07/2024)   PRAPARE - Administrator, Civil Service (Medical): No    Lack of Transportation (Non-Medical): No  Physical Activity: Unknown (01/07/2024)   Exercise Vital Sign    Days of Exercise per Week: Patient declined    Minutes of Exercise per Session: Not on file  Stress: No Stress Concern Present (01/07/2024)   [employer] of Occupational Health - Occupational Stress Questionnaire    Feeling of Stress: Only a little  Social Connections: Moderately Isolated (01/07/2024)   Social Connection and Isolation Panel    Frequency of Communication with Friends and Family: More than three times a week    Frequency of Social Gatherings with Friends and Family: Patient declined    Attends Religious Services: Patient declined    Active Member of Clubs or Organizations: No    Attends Banker Meetings: Not on file    Marital Status: Married  Intimate Partner Violence: Not on file    Lab Results  Component Value Date   HGBA1C 5.4 04/06/2024   HGBA1C 5.7 (A) 12/02/2023   HGBA1C 6.2 (A) 09/02/2023   Lab Results  Component Value Date   CHOL 120 05/28/2023   Lab Results  Component Value Date   HDL 57 05/28/2023   Lab Results  Component Value Date   LDLCALC 47 05/28/2023   Lab Results  Component Value Date   TRIG 81 05/28/2023   Lab Results  Component Value Date   CHOLHDL 2.1 05/28/2023   Lab Results  Component Value Date   CREATININE 0.97 05/28/2023   Lab Results  Component Value Date   GFR 65.76 02/20/2022   Lab Results  Component Value Date   MICROALBUR <0.2 09/14/2022      Component Value Date/Time   NA 143 05/28/2023 1054   K 4.0 05/28/2023 1054   CL 103 05/28/2023 1054   CO2 25 05/28/2023 1054    GLUCOSE 103 (H) 05/28/2023 1054   GLUCOSE 87 09/14/2022 1548   BUN 14 05/28/2023 1054   CREATININE 0.97 05/28/2023 1054   CREATININE 0.94 09/14/2022 1548   CALCIUM  9.8 05/28/2023 1054   PROT 7.0 05/28/2023 1054   ALBUMIN 4.5 05/28/2023 1054   AST 22 05/28/2023 1054   ALT 23 05/28/2023 1054   ALKPHOS 92 05/28/2023 1054   BILITOT 0.4 05/28/2023 1054   GFRNONAA >60 04/04/2022 1326   GFRNONAA >89 09/23/2014 1226   GFRAA 91 03/31/2019 0901   GFRAA >89 09/23/2014 1226      Latest Ref Rng & Units 05/28/2023   10:54 AM 09/14/2022    3:48 PM 05/02/2022   10:29 AM  BMP  Glucose 70 - 99 mg/dL 896  87  80   BUN 8 - 27 mg/dL 14  18  9    Creatinine 0.57 - 1.00 mg/dL 9.02  9.05  9.24   BUN/Creat Ratio 12 - 28 14  SEE NOTE:  12   Sodium 134 - 144 mmol/L 143  140  142   Potassium 3.5 - 5.2 mmol/L 4.0  3.6  3.9   Chloride 96 - 106 mmol/L 103  105  104   CO2 20 - 29 mmol/L 25  28  24    Calcium  8.7 - 10.3 mg/dL 9.8  9.6  9.8        Component Value Date/Time  WBC 6.1 05/02/2022 1029   WBC 6.6 04/04/2022 1326   RBC 4.30 05/02/2022 1029   RBC 4.47 04/04/2022 1326   HGB 11.4 05/02/2022 1029   HCT 35.2 05/02/2022 1029   PLT 301 05/02/2022 1029   MCV 82 05/02/2022 1029   MCH 26.5 (L) 05/02/2022 1029   MCH 27.7 04/04/2022 1326   MCHC 32.4 05/02/2022 1029   MCHC 33.1 04/04/2022 1326   RDW 12.3 05/02/2022 1029   LYMPHSABS 2.6 05/02/2022 1029   MONOABS 0.6 04/04/2022 1326   EOSABS 0.3 05/02/2022 1029   BASOSABS 0.1 05/02/2022 1029     Parts of this note may have been dictated using voice recognition software. There may be variances in spelling and vocabulary which are unintentional. Not all errors are proofread. Please notify the dino if any discrepancies are noted or if the meaning of any statement is not clear.

## 2024-04-13 ENCOUNTER — Ambulatory Visit (INDEPENDENT_AMBULATORY_CARE_PROVIDER_SITE_OTHER): Admitting: Family Medicine

## 2024-04-13 ENCOUNTER — Encounter: Payer: Self-pay | Admitting: Family Medicine

## 2024-04-13 VITALS — BP 122/74 | HR 70 | Temp 97.9°F | Ht 64.0 in | Wt 163.4 lb

## 2024-04-13 DIAGNOSIS — Z0000 Encounter for general adult medical examination without abnormal findings: Secondary | ICD-10-CM | POA: Diagnosis not present

## 2024-04-13 NOTE — Patient Instructions (Signed)
 To keep you healthy, please keep in mind the following health maintenance items that you are due for:   Health Maintenance Due  Topic Date Due   Mammogram  01/13/2023   OPHTHALMOLOGY EXAM  08/09/2023     Best Wishes,   Dr. Lang

## 2024-04-13 NOTE — Progress Notes (Signed)
 Chief Complaint  Patient presents with   Medicare Wellness     Subjective:   Heidi Howell is a 68 y.o. female who presents for a Welcome to Medicare Exam.   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) Persons participating in visit and providing information:: patient Medicare Wellness Visit Mode:: In-person (required for WTM) Interpreter Needed?: No Pre-visit prep was completed: no AWV questionnaire completed by patient prior to visit?: no Living arrangements:: lives with spouse/significant other Patient's Overall Health Status Rating: very good Typical amount of pain: none Does pain affect daily life?: no Are you currently prescribed opioids?: no  Dietary Habits and Nutritional Risks How many meals a day?: 2 Eats fruit and vegetables daily?: yes Most meals are obtained by: preparing own meals; having others provide food In the last 2 weeks, have you had any of the following?: none  Functional Status Activities of Daily Living (to include ambulation/medication): Independent Ambulation: Independent Medication Administration: Independent Home Management (perform basic housework or laundry): Independent Manage your own finances?: yes Primary transportation is: driving Concerns about vision?: no *vision screening is required for WTM* Concerns about hearing?: no  Fall Screening Falls in the past year?: 0 Number of falls in past year: 0 Was there an injury with Fall?: 0 Fall Risk Category Calculator: 0 Patient Fall Risk Level: Low Fall Risk  Fall Risk Patient at Risk for Falls Due to: No Fall Risks Fall risk Follow up: Falls evaluation completed  Home and Transportation Safety: All rugs have non-skid backing?: yes All stairs or steps have railings?: yes Grab bars in the bathtub or shower?: yes Have non-skid surface in bathtub or shower?: yes Good home lighting?: yes Regular seat belt use?: yes Hospital stays in the  last year:: no  Cognitive Assessment Difficulty concentrating, remembering, or making decisions? : no Will 6CIT or Mini Cog be Completed: no 6CIT or Mini Cog Declined: patient alert, oriented, able to answer questions appropriately and recall recent events  Advance Directives (For Healthcare) Does Patient Have a Medical Advance Directive?: No Would patient like information on creating a medical advance directive?: Yes (MAU/Ambulatory/Procedural Areas - Information given)  Reviewed/Updated  Reviewed/Updated: Reviewed All (Medical, Surgical, Family, Medications, Allergies, Care Teams, Patient Goals); Medical History; Surgical History; Family History; Medications; Allergies    Allergies (verified) Ace inhibitors   Current Medications (verified) Outpatient Encounter Medications as of 04/13/2024  Medication Sig   Blood Glucose Monitoring Suppl (FREESTYLE FREEDOM LITE) w/Device KIT Use to check blood sugar 4 times a day.   Blood Glucose Monitoring Suppl DEVI 1 each by Does not apply route in the morning, at noon, and at bedtime. May substitute to any manufacturer covered by patient's insurance.   carvedilol  (COREG ) 25 MG tablet Take 1 tablet (25 mg total) by mouth 2 (two) times daily with a meal.   dorzolamide-timolol (COSOPT) 2-0.5 % ophthalmic solution Place 1 drop into both eyes 2 (two) times daily.   glucose blood (FREESTYLE LITE) test strip Use to check blood sugar 2 times a day   Lancets (FREESTYLE) lancets Use to check blood sugar 2 times a day   latanoprost (XALATAN) 0.005 % ophthalmic solution SMARTSIG:In Eye(s)   metFORMIN  (GLUCOPHAGE -XR) 500 MG 24 hr tablet Take 2 tablets (1,000 mg total) by mouth daily with breakfast.   montelukast  (SINGULAIR ) 10 MG tablet TAKE 1 TABLET DAILY   Multiple Vitamin (MULTIVITAMIN) tablet Take 1 tablet by mouth daily.   Netarsudil Dimesylate 0.02 % SOLN Apply  to eye. Instill 1 drop in both eyes every evening   ONETOUCH VERIO test strip USE 1 STRIP  DAILY   rosuvastatin  (CRESTOR ) 5 MG tablet TAKE 1 TABLET DAILY   telmisartan  (MICARDIS ) 80 MG tablet TAKE 1 TABLET DAILY   tirzepatide  (MOUNJARO ) 15 MG/0.5ML Pen Inject 15 mg into the skin once a week.   zolpidem  (AMBIEN ) 10 MG tablet Take 1 tablet (10 mg total) by mouth at bedtime. for sleep   zolpidem  (AMBIEN ) 10 MG tablet TAKE 1 TABLET AT BEDTIME AS NEEDED FOR SLEEP   zolpidem  (AMBIEN ) 10 MG tablet TAKE 1 TABLET AT BEDTIME AS NEEDED FOR SLEEP   No facility-administered encounter medications on file as of 04/13/2024.    History: Past Medical History:  Diagnosis Date   Allergy    Anemia    Diabetes mellitus without complication (HCC)    Family history of malignant neoplasm of digestive organs 02/09/2022   Fibroids    GERD (gastroesophageal reflux disease)    Glaucoma    H/O: obesity    Hypertension    Varicose vein    Past Surgical History:  Procedure Laterality Date   BARTHOLIN GLAND CYST EXCISION  05/07/2008   BREAST REDUCTION SURGERY Bilateral 04/10/2022   Procedure: MAMMARY REDUCTION  (BREAST);  Surgeon: Arelia Filippo, MD;  Location: [facility];  Service: Plastics;  Laterality: Bilateral;   BREAST SURGERY  12/23   CESAREAN SECTION  8009,8003   DILATION AND CURETTAGE OF UTERUS  05/08/1987   EYE SURGERY     TUBAL LIGATION  05/07/1994   bilateral   Family History  Problem Relation Age of Onset   Cancer Father        colon   Cancer Mother        per patient stomach   Diabetes Maternal Grandmother    Diabetes Maternal Grandfather    Mental illness Sister    Stroke Sister    Heart disease Brother    Diabetes Brother    Heart disease Sister    Stroke Paternal Aunt    Social History   Occupational History   Not on file  Tobacco Use   Smoking status: Never    Passive exposure: Never   Smokeless tobacco: Never  Vaping Use   Vaping status: Never Used  Substance and Sexual Activity   Alcohol use: No   Drug use: No   Sexual activity: Yes     Partners: Male    Birth control/protection: Post-menopausal    Comment: 1st intercourse 78 yo-5 partners   Tobacco Counseling Counseling given: Not Answered  SDOH Screenings   Food Insecurity: Patient Declined (01/07/2024)  Housing: Low Risk  (01/07/2024)  Transportation Needs: No Transportation Needs (01/07/2024)  Alcohol Screen: Low Risk  (08/06/2022)  Depression (PHQ2-9): Low Risk  (04/13/2024)  Financial Resource Strain: Patient Declined (01/07/2024)  Physical Activity: Unknown (01/07/2024)  Social Connections: Moderately Isolated (01/07/2024)  Stress: No Stress Concern Present (01/07/2024)  Tobacco Use: Low Risk  (04/13/2024)   See flowsheets for full screening details  Depression Screen PHQ 2 & 9 Depression Scale- Over the past 2 weeks, how often have you been bothered by any of the following problems? Little interest or pleasure in doing things: 0 Feeling down, depressed, or hopeless (PHQ Adolescent also includes...irritable): 0 PHQ-2 Total Score: 0 Trouble falling or staying asleep, or sleeping too much: 0 Feeling tired or having little energy: 0 Poor appetite or overeating (PHQ Adolescent also includes...weight loss): 0 Feeling bad about yourself -  or that you are a failure or have let yourself or your family down: 0 Trouble concentrating on things, such as reading the newspaper or watching television (PHQ Adolescent also includes...like school work): 0 Moving or speaking so slowly that other people could have noticed. Or the opposite - being so fidgety or restless that you have been moving around a lot more than usual: 0 Thoughts that you would be better off dead, or of hurting yourself in some way: 0 PHQ-9 Total Score: 0 If you checked off any problems, how difficult have these problems made it for you to do your work, take care of things at home, or get along with other people?: Somewhat difficult  Depression Treatment Depression Interventions/Treatment : EYV7-0 Score <4 Follow-up Not  Indicated      Goals Addressed   None          Objective:    Today's Vitals   04/13/24 0905  BP: 122/74  Pulse: 70  Temp: 97.9 F (36.6 C)  TempSrc: Oral  SpO2: 100%  Weight: 163 lb 6.4 oz (74.1 kg)  Height: 5' 4 (1.626 m)   Body mass index is 28.05 kg/m.   Physical Exam Vitals reviewed.  Constitutional:      General: She is not in acute distress.    Appearance: Normal appearance. She is not ill-appearing, toxic-appearing or diaphoretic.  HENT:     Head: Normocephalic and atraumatic.     Right Ear: Tympanic membrane and external ear normal. There is no impacted cerumen.     Left Ear: Tympanic membrane and external ear normal. There is no impacted cerumen.     Nose: Nose normal.     Mouth/Throat:     Pharynx: Oropharynx is clear.  Eyes:     General: No scleral icterus.    Extraocular Movements: Extraocular movements intact.     Conjunctiva/sclera: Conjunctivae normal.     Pupils: Pupils are equal, round, and reactive to light.  Cardiovascular:     Rate and Rhythm: Normal rate and regular rhythm.     Pulses: Normal pulses.     Heart sounds: Normal heart sounds. No murmur heard.    No friction rub. No gallop.  Pulmonary:     Effort: Pulmonary effort is normal. No respiratory distress.     Breath sounds: Normal breath sounds. No wheezing, rhonchi or rales.  Abdominal:     General: Bowel sounds are normal. There is no distension.     Palpations: Abdomen is soft. There is no mass.     Tenderness: There is no abdominal tenderness. There is no guarding.  Musculoskeletal:        General: No deformity.     Cervical back: Normal range of motion and neck supple.     Right lower leg: No edema.     Left lower leg: No edema.  Lymphadenopathy:     Cervical: No cervical adenopathy.  Skin:    General: Skin is warm.     Capillary Refill: Capillary refill takes less than 2 seconds.     Findings: No erythema or rash.  Neurological:     General: No focal deficit present.      Mental Status: She is alert and oriented to person, place, and time.     Cranial Nerves: Cranial nerves 2-12 are intact. No cranial nerve deficit or facial asymmetry.     Motor: Motor function is intact. No weakness.     Gait: Gait normal.  Psychiatric:  Mood and Affect: Mood normal.        Behavior: Behavior normal.      Hearing/Vision screen No results found. Immunizations and Health Maintenance Health Maintenance  Topic Date Due   Medicare Annual Wellness (AWV)  Never done   Mammogram  01/13/2023   OPHTHALMOLOGY EXAM  08/09/2023   Diabetic kidney evaluation - eGFR measurement  05/27/2024   Diabetic kidney evaluation - Urine ACR  05/27/2024   COVID-19 Vaccine (7 - 2025-26 season) 07/19/2024   HEMOGLOBIN A1C  10/05/2024   FOOT EXAM  01/08/2025   Colonoscopy  03/03/2033   DTaP/Tdap/Td (3 - Td or Tdap) 06/07/2033   Pneumococcal Vaccine: 50+ Years  Completed   Influenza Vaccine  Completed   Bone Density Scan  Completed   Hepatitis C Screening  Completed   Zoster Vaccines- Shingrix  Completed   Meningococcal B Vaccine  Aged Out   Hepatitis B Vaccines 19-59 Average Risk  Discontinued    EKG: normal EKG, normal sinus rhythm, unchanged from previous tracings, normal sinus rhythm     Assessment/Plan:  This is a routine wellness examination for Heidi Howell.  Patient Care Team: Sharma Coyer, MD as PCP - General (Family Medicine) Kristie Lamprey, MD as Consulting Physician (Gastroenterology) Kassie Mallick, MD (Inactive) as Consulting Physician (Endocrinology) Abigail, Maude [facility]) Ob/Gyn, [facility] (Obstetrics and Gynecology)  I have personally reviewed and noted the following in the patient's chart:   Medical and social history Use of alcohol, tobacco or illicit drugs  Current medications and supplements including opioid prescriptions. Functional ability and status Nutritional status Physical activity Advanced directives List of other  physicians Hospitalizations, surgeries, and ER visits in previous 12 months Vitals Screenings to include cognitive, depression, and falls Referrals and appointments  Orders Placed This Encounter  Procedures   EKG 12-Lead   In addition, I have reviewed and discussed with patient certain preventive protocols, quality metrics, and best practice recommendations. A written personalized care plan for preventive services as well as general preventive health recommendations were provided to patient.  Assessment and Plan Assessment & Plan Adult Wellness Visit Routine wellness visit for a 68 year old female. EKG and vitals are normal. No new concerns reported. - Continue routine health maintenance screenings  Type 2 diabetes mellitus Chronic  Well-controlled with an A1c of 5.4. She is satisfied with current management and does not require medication. Emphasis on diet, exercise, and stress management for continued control. - Continue current diabetes management plan - Encouraged diet, exercise, and stress management    Insomnia Chronic insomnia partially managed with Ambien . She reports variable sleep quality, often influenced by stress and anxiety. Prefers to manage anxiety without additional medication. - Continue Ambien  10mg  prn for insomnia management     Coyer Sharma, MD   04/13/2024   Return in 1 year (on 04/14/2025) for AWV.

## 2024-05-04 ENCOUNTER — Other Ambulatory Visit: Payer: Self-pay | Admitting: Family Medicine

## 2024-05-20 ENCOUNTER — Other Ambulatory Visit: Payer: Self-pay | Admitting: Family Medicine

## 2024-05-20 MED ORDER — MONTELUKAST SODIUM 10 MG PO TABS: 10.0000 mg | ORAL_TABLET | Freq: Every day | ORAL | 3 refills | Status: AC

## 2024-05-20 NOTE — Telephone Encounter (Signed)
 Copied from CRM [id number]. Topic: Clinical - Medication Refill >> May 11, 2024 11:39 AM Ana L wrote: Medication: montelukast  (SINGULAIR ) 10 MG tablet  Has the patient contacted their pharmacy? Yes (Agent: If no, request that the patient contact the pharmacy for the refill. If patient does not wish to contact the pharmacy document the reason why and proceed with request.) (Agent: If yes, when and what did the pharmacy advise?)  This is the patient's preferred pharmacy:   [facility]  [phone] [phone]    Is this the correct pharmacy for this prescription? Yes If no, delete pharmacy and type the correct one.   Has the prescription been filled recently? Yes  Is the patient out of the medication? No  Has the patient been seen for an appointment in the last year OR does the patient have an upcoming appointment? Yes  Can we respond through MyChart? Yes  Agent: Please be advised that Rx refills may take up to 3 business days. We ask that you follow-up with your pharmacy. >> May 20, 2024  3:51 PM Hadassah PARAS wrote: Pt is calling to f/u on medication refill. It was submitted on the 01/05. Please advise pt on #[id number]

## 2024-05-27 ENCOUNTER — Other Ambulatory Visit: Payer: Self-pay | Admitting: "Endocrinology

## 2024-06-01 ENCOUNTER — Other Ambulatory Visit: Payer: Self-pay | Admitting: Family Medicine

## 2024-06-01 DIAGNOSIS — F5101 Primary insomnia: Secondary | ICD-10-CM

## 2024-06-01 DIAGNOSIS — I10 Essential (primary) hypertension: Secondary | ICD-10-CM

## 2024-06-01 NOTE — Telephone Encounter (Unsigned)
 Copied from CRM #[id number]. Topic: Clinical - Medication Refill >> Jun 01, 2024  9:46 AM Donna BRAVO wrote: Medication:  zolpidem  (AMBIEN ) 10 MG tablet  carvedilol  (COREG ) 25 MG tablet  Has the patient contacted their pharmacy? no Sending to new pharmacy   This is the patient's preferred pharmacy:   [facility] [phone]  Fax    Is this the correct pharmacy for this prescription? Yes If no, delete pharmacy and type the correct one.   Has the prescription been filled recently? Yes  Is the patient out of the medication? No  Has the patient been seen for an appointment in the last year OR does the patient have an upcoming appointment? Yes  Can we respond through MyChart? Yes  Agent: Please be advised that Rx refills may take up to 3 business days. We ask that you follow-up with your pharmacy.

## 2024-06-02 NOTE — Telephone Encounter (Signed)
 Requested medication (s) are due for refill today- no  Requested medication (s) are on the active medication list -yes  Future visit scheduled -yes  Last refill: Carvedilol - 12/17/23 #180 3RF- fails lab protocol- over 1 year-05/19/23                  Zolpidem - 03/27/24 #30 3R- non delegated Rx  Notes to clinic: Change in pharmacy- [facility]- requesting transfer of Rx  Requested Prescriptions  Pending Prescriptions Disp Refills   zolpidem  (AMBIEN ) 10 MG tablet 30 tablet 0    Sig: Take 1 tablet (10 mg total) by mouth at bedtime. for sleep     Not Delegated - Psychiatry:  Anxiolytics/Hypnotics Failed - 06/02/2024 12:36 PM      Failed - This refill cannot be delegated      Failed - Urine Drug Screen completed in last 360 days      Passed - Valid encounter within last 6 months    Recent Outpatient Visits           1 month ago Welcome to [employer] preventive visit   [facility] Simmons-Robinson, [place], MD   4 months ago Primary insomnia   [facility], [place], MD   2 years ago Encounter to establish care   [place] [facility], [place] J, NP   4 years ago Type 2 diabetes mellitus with other ophthalmic complication, with long-term current use of insulin  [facility])   Primary Care at [facility], Santana LABOR, MD   5 years ago Health maintenance examination   Primary Care at [facility], [place] A, MD               carvedilol  (COREG ) 25 MG tablet 180 tablet 3    Sig: Take 1 tablet (25 mg total) by mouth 2 (two) times daily with a meal.     Cardiovascular: Beta Blockers 3 Failed - 06/02/2024 12:36 PM      Failed - Cr in normal range and within 360 days    Creat  Date Value Ref Range Status  09/14/2022 0.94 0.50 - 1.05 mg/dL Final   Creatinine, Ser  Date Value Ref Range Status  05/28/2023 0.97 0.57 - 1.00 mg/dL Final   Creatinine, Urine  Date Value Ref  Range Status  09/14/2022 56 20 - 275 mg/dL Final         Failed - AST in normal range and within 360 days    AST  Date Value Ref Range Status  05/28/2023 22 0 - 40 IU/L Final         Failed - ALT in normal range and within 360 days    ALT  Date Value Ref Range Status  05/28/2023 23 0 - 32 IU/L Final         Passed - Last BP in normal range    BP Readings from Last 1 Encounters:  04/13/24 122/74         Passed - Last Heart Rate in normal range    Pulse Readings from Last 1 Encounters:  04/13/24 70         Passed - Valid encounter within last 6 months    Recent Outpatient Visits           1 month ago Welcome to [employer] preventive visit   [facility] Simmons-Robinson, [place], MD   4 months ago Primary insomnia   [facility]-Robinson, [place], MD   2 years ago Encounter to establish care   [facility] at [facility], [place] J, NP   4 years ago Type 2 diabetes mellitus with other ophthalmic complication, with long-term current use of insulin  [facility])   Primary Care at [facility], [place] LABOR, MD   5 years ago Health maintenance examination   Primary Care at [facility], [place] A, MD                 Requested Prescriptions  Pending Prescriptions Disp Refills   zolpidem  (AMBIEN ) 10 MG tablet 30 tablet 0    Sig: Take 1 tablet (10 mg total) by mouth at bedtime. for sleep     Not Delegated - Psychiatry:  Anxiolytics/Hypnotics Failed - 06/02/2024 12:36 PM      Failed - This refill cannot be delegated      Failed - Urine Drug Screen completed in last 360 days      Passed - Valid encounter within last 6 months    Recent Outpatient Visits           1 month ago Welcome to [employer] preventive visit   [facility] Simmons-Robinson, [place], MD   4 months ago Primary insomnia   [facility] [place], [place], MD   2 years  ago Encounter to establish care   [place] [facility], [place] J, NP   4 years ago Type 2 diabetes mellitus with other ophthalmic complication, with long-term current use of insulin  [facility])   Primary Care at [facility], Santana LABOR, MD   5 years ago Health maintenance examination   Primary Care at [facility], [place] A, MD               carvedilol  (COREG ) 25 MG tablet 180 tablet 3    Sig: Take 1 tablet (25 mg total) by mouth 2 (two) times daily with a meal.     Cardiovascular: Beta Blockers 3 Failed - 06/02/2024 12:36 PM      Failed - Cr in normal range and within 360 days    Creat  Date Value Ref Range Status  09/14/2022 0.94 0.50 - 1.05 mg/dL Final   Creatinine, Ser  Date Value Ref Range Status  05/28/2023 0.97 0.57 - 1.00 mg/dL Final   Creatinine, Urine  Date Value Ref Range Status  09/14/2022 56 20 - 275 mg/dL Final         Failed - AST in normal range and within 360 days    AST  Date Value Ref Range Status  05/28/2023 22 0 - 40 IU/L Final         Failed - ALT in normal range and within 360 days    ALT  Date Value Ref Range Status  05/28/2023 23 0 - 32 IU/L Final         Passed - Last BP in normal range    BP Readings from Last 1 Encounters:  04/13/24 122/74         Passed - Last Heart Rate in normal range    Pulse Readings from Last 1 Encounters:  04/13/24 70         Passed - Valid encounter within last 6 months    Recent Outpatient Visits           1 month ago Welcome to [employer] preventive visit   [facility]  Simmons-Robinson, [place], MD   4 months ago Primary insomnia   [facility] [place], [place], MD   2 years ago Encounter to establish care   [facility] at [facility], [place] J, NP   4 years ago Type 2 diabetes mellitus with other ophthalmic complication, with long-term current use of insulin  [facility])   Primary Care at [place], Santana LABOR, MD   5 years ago Health maintenance examination   Primary Care at [facility], Sant[place] LABOR, MD

## 2024-06-03 MED ORDER — CARVEDILOL 25 MG PO TABS: 25.0000 mg | ORAL_TABLET | Freq: Two times a day (BID) | ORAL | 3 refills | Status: AC

## 2024-06-03 MED ORDER — ZOLPIDEM TARTRATE 10 MG PO TABS: 10.0000 mg | ORAL_TABLET | Freq: Every day | ORAL | 0 refills | Status: AC

## 2024-06-09 ENCOUNTER — Other Ambulatory Visit: Payer: Self-pay

## 2024-06-09 DIAGNOSIS — E11649 Type 2 diabetes mellitus with hypoglycemia without coma: Secondary | ICD-10-CM

## 2024-06-09 MED ORDER — TIRZEPATIDE 15 MG/0.5ML ~~LOC~~ SOAJ: 15.0000 mg | SUBCUTANEOUS | 3 refills | Status: AC

## 2024-06-11 ENCOUNTER — Telehealth: Payer: Self-pay

## 2024-06-11 NOTE — Telephone Encounter (Signed)
 Pt needs PA done for [facility].

## 2024-06-12 ENCOUNTER — Other Ambulatory Visit (HOSPITAL_COMMUNITY): Payer: Self-pay

## 2024-06-12 ENCOUNTER — Telehealth: Payer: Self-pay

## 2024-06-12 NOTE — Telephone Encounter (Signed)
 Pharmacy Patient Advocate Encounter   Received notification from Pt Calls Messages that prior authorization for Mounjaro  15MG /0.5ML auto-injectors is required/requested.   Insurance verification completed.   The patient is insured through [facility].   Per test claim: PA required and submitted KEY/EOC/Request #: [id number] from 06/12/2024 to 05/06/2025. Ran test claim, Copay is $30. This test claim was processed through [facility]- copay amounts may vary at other pharmacies due to pharmacy/plan contracts, or as the patient moves through the different stages of their insurance plan.

## 2024-07-06 ENCOUNTER — Ambulatory Visit: Admitting: Family Medicine

## 2024-08-05 ENCOUNTER — Ambulatory Visit: Admitting: "Endocrinology

## 2025-04-13 ENCOUNTER — Ambulatory Visit
